# Patient Record
Sex: Female | Born: 1960
Health system: Southern US, Community
[De-identification: ages and names within clinical notes are randomized; demographics above are authoritative.]

## PROBLEM LIST (undated history)

## (undated) DIAGNOSIS — M1990 Unspecified osteoarthritis, unspecified site: Secondary | ICD-10-CM

## (undated) DIAGNOSIS — E559 Vitamin D deficiency, unspecified: Secondary | ICD-10-CM

## (undated) DIAGNOSIS — E079 Disorder of thyroid, unspecified: Secondary | ICD-10-CM

## (undated) DIAGNOSIS — E785 Hyperlipidemia, unspecified: Secondary | ICD-10-CM

## (undated) DIAGNOSIS — N200 Calculus of kidney: Secondary | ICD-10-CM

## (undated) DIAGNOSIS — E039 Hypothyroidism, unspecified: Secondary | ICD-10-CM

## (undated) DIAGNOSIS — F419 Anxiety disorder, unspecified: Secondary | ICD-10-CM

## (undated) DIAGNOSIS — H269 Unspecified cataract: Secondary | ICD-10-CM

## (undated) DIAGNOSIS — R7303 Prediabetes: Secondary | ICD-10-CM

## (undated) DIAGNOSIS — R9431 Abnormal electrocardiogram [ECG] [EKG]: Secondary | ICD-10-CM

## (undated) DIAGNOSIS — K760 Fatty (change of) liver, not elsewhere classified: Secondary | ICD-10-CM

## (undated) DIAGNOSIS — G4700 Insomnia, unspecified: Secondary | ICD-10-CM

## (undated) DIAGNOSIS — K219 Gastro-esophageal reflux disease without esophagitis: Secondary | ICD-10-CM

## (undated) DIAGNOSIS — T7840XA Allergy, unspecified, initial encounter: Secondary | ICD-10-CM

## (undated) DIAGNOSIS — I10 Essential (primary) hypertension: Secondary | ICD-10-CM

## (undated) HISTORY — DX: Gastro-esophageal reflux disease without esophagitis: K21.9

## (undated) HISTORY — DX: Hyperlipidemia, unspecified: E78.5

## (undated) HISTORY — DX: Prediabetes: R73.03

## (undated) HISTORY — DX: Abnormal electrocardiogram (ECG) (EKG): R94.31

## (undated) HISTORY — DX: Insomnia, unspecified: G47.00

## (undated) HISTORY — DX: Disorder of thyroid, unspecified: E07.9

## (undated) HISTORY — DX: Fatty (change of) liver, not elsewhere classified: K76.0

## (undated) HISTORY — DX: Unspecified cataract: H26.9

## (undated) HISTORY — DX: Vitamin D deficiency, unspecified: E55.9

## (undated) HISTORY — DX: Anxiety disorder, unspecified: F41.9

## (undated) HISTORY — DX: Allergy, unspecified, initial encounter: T78.40XA

## (undated) HISTORY — PX: TUBAL LIGATION: SHX77

## (undated) HISTORY — PX: CHOLECYSTECTOMY: SHX55

## (undated) HISTORY — DX: Calculus of kidney: N20.0

## (undated) HISTORY — DX: Essential (primary) hypertension: I10

## (undated) HISTORY — PX: COLONOSCOPY: SHX174

## (undated) HISTORY — PX: KNEE ARTHROSCOPY: SHX127

## (undated) HISTORY — PX: POLYPECTOMY: SHX149

## (undated) HISTORY — PX: BREAST BIOPSY: SHX20

---

## 1999-06-13 ENCOUNTER — Encounter: Payer: Self-pay | Admitting: Internal Medicine

## 1999-06-13 ENCOUNTER — Ambulatory Visit (HOSPITAL_COMMUNITY): Admission: RE | Admit: 1999-06-13 | Discharge: 1999-06-13 | Payer: Self-pay | Admitting: Internal Medicine

## 2000-06-07 ENCOUNTER — Other Ambulatory Visit: Admission: RE | Admit: 2000-06-07 | Discharge: 2000-06-07 | Payer: Self-pay | Admitting: Internal Medicine

## 2000-06-13 ENCOUNTER — Ambulatory Visit (HOSPITAL_COMMUNITY): Admission: RE | Admit: 2000-06-13 | Discharge: 2000-06-13 | Payer: Self-pay | Admitting: Internal Medicine

## 2000-06-13 ENCOUNTER — Encounter: Payer: Self-pay | Admitting: Internal Medicine

## 2001-06-17 ENCOUNTER — Ambulatory Visit (HOSPITAL_COMMUNITY): Admission: RE | Admit: 2001-06-17 | Discharge: 2001-06-17 | Payer: Self-pay | Admitting: Internal Medicine

## 2001-06-17 ENCOUNTER — Encounter: Payer: Self-pay | Admitting: Internal Medicine

## 2002-07-09 ENCOUNTER — Ambulatory Visit (HOSPITAL_COMMUNITY): Admission: RE | Admit: 2002-07-09 | Discharge: 2002-07-09 | Payer: Self-pay | Admitting: Internal Medicine

## 2002-07-09 ENCOUNTER — Encounter: Payer: Self-pay | Admitting: Internal Medicine

## 2003-10-06 ENCOUNTER — Ambulatory Visit (HOSPITAL_COMMUNITY): Admission: RE | Admit: 2003-10-06 | Discharge: 2003-10-06 | Payer: Self-pay | Admitting: Internal Medicine

## 2004-10-17 ENCOUNTER — Other Ambulatory Visit: Admission: RE | Admit: 2004-10-17 | Discharge: 2004-10-17 | Payer: Self-pay | Admitting: Internal Medicine

## 2004-11-09 ENCOUNTER — Ambulatory Visit (HOSPITAL_COMMUNITY): Admission: RE | Admit: 2004-11-09 | Discharge: 2004-11-09 | Payer: Self-pay | Admitting: Internal Medicine

## 2005-11-09 ENCOUNTER — Ambulatory Visit (HOSPITAL_COMMUNITY): Admission: RE | Admit: 2005-11-09 | Discharge: 2005-11-09 | Payer: Self-pay | Admitting: Internal Medicine

## 2006-12-03 ENCOUNTER — Ambulatory Visit (HOSPITAL_COMMUNITY): Admission: RE | Admit: 2006-12-03 | Discharge: 2006-12-03 | Payer: Self-pay | Admitting: Internal Medicine

## 2007-12-02 ENCOUNTER — Encounter: Payer: Self-pay | Admitting: Gastroenterology

## 2007-12-02 ENCOUNTER — Other Ambulatory Visit: Admission: RE | Admit: 2007-12-02 | Discharge: 2007-12-02 | Payer: Self-pay | Admitting: Internal Medicine

## 2007-12-05 ENCOUNTER — Ambulatory Visit (HOSPITAL_COMMUNITY): Admission: RE | Admit: 2007-12-05 | Discharge: 2007-12-05 | Payer: Self-pay | Admitting: Internal Medicine

## 2010-04-09 ENCOUNTER — Encounter: Payer: Self-pay | Admitting: Gastroenterology

## 2010-04-10 ENCOUNTER — Other Ambulatory Visit: Admission: RE | Admit: 2010-04-10 | Discharge: 2010-04-10 | Payer: Self-pay | Admitting: Internal Medicine

## 2010-04-10 ENCOUNTER — Encounter: Payer: Self-pay | Admitting: Gastroenterology

## 2010-04-17 ENCOUNTER — Encounter: Payer: Self-pay | Admitting: Gastroenterology

## 2010-04-26 ENCOUNTER — Ambulatory Visit (HOSPITAL_COMMUNITY): Admission: RE | Admit: 2010-04-26 | Discharge: 2010-04-26 | Payer: Self-pay | Admitting: Internal Medicine

## 2010-05-02 ENCOUNTER — Encounter (INDEPENDENT_AMBULATORY_CARE_PROVIDER_SITE_OTHER): Payer: Self-pay | Admitting: *Deleted

## 2010-05-04 ENCOUNTER — Ambulatory Visit: Payer: Self-pay | Admitting: Gastroenterology

## 2010-05-16 ENCOUNTER — Telehealth: Payer: Self-pay | Admitting: Gastroenterology

## 2010-05-18 ENCOUNTER — Ambulatory Visit: Payer: Self-pay | Admitting: Gastroenterology

## 2010-05-25 ENCOUNTER — Encounter: Payer: Self-pay | Admitting: Gastroenterology

## 2010-12-19 NOTE — Miscellaneous (Signed)
Summary: [facility] Previsit/prep  Clinical Lists Changes  Medications: Added new medication of MOVIPREP 100 GM  SOLR (PEG-KCL-NACL-NASULF-NA ASC-C) As per prep instructions. - Signed Rx of MOVIPREP 100 GM  SOLR (PEG-KCL-NACL-NASULF-NA ASC-C) As per prep instructions.;  #1 x 0;  Signed;  Entered by: Wyona Almas RN;  Authorized by: Louis Meckel MD;  Method used: Electronically to [facility] [address] [place], [place], [place]  [address], Ph: [phone], Fax: [phone] Allergies: Added new allergy or adverse reaction of CODEINE Observations: Added new observation of NKA: F (05/04/2010 8:16)    Prescriptions: MOVIPREP 100 GM  SOLR (PEG-KCL-NACL-NASULF-NA ASC-C) As per prep instructions.  #1 x 0   Entered by:   Wyona Almas RN   Authorized by:   Louis Meckel MD   Signed by:   Wyona Almas RN on 05/04/2010   Method used:   Electronically to        [facility] (retail)       [address]       [place], [place]  [address]       Ph: [phone]       Fax: [phone]   RxID:   [id number]

## 2010-12-19 NOTE — Procedures (Signed)
Summary: Colonoscopy  Patient: Shannon Pope Note: All result statuses are Final unless otherwise noted.  Tests: (1) Colonoscopy (COL)   COL Colonoscopy           DONE (C)     [facility]     [address]     [place], [place]  [address]           COLONOSCOPY PROCEDURE REPORT           PATIENT:  Shannon, Pope  MR#:  [id number]     BIRTHDATE:  04-Nov-1961, 48 yrs. old  GENDER:  female           ENDOSCOPIST:  Barbette Hair. Arlyce Dice, MD     Referred by:           PROCEDURE DATE:  05/18/2010     PROCEDURE:  Colonoscopy with polypectomy and submucosal injection     ASA CLASS:  Class I     INDICATIONS:  1) Routine Risk Screening           MEDICATIONS:   Fentanyl 75 mcg IV, Versed 9 mg IV, Benadryl 12.5mg      IV (correction)           DESCRIPTION OF PROCEDURE:   After the risks benefits and     alternatives of the procedure were thoroughly explained, informed     consent was obtained.  Digital rectal exam was performed and     revealed no abnormalities.   The LB [id number] endoscope     was introduced through the anus and advanced to the cecum, which     was identified by both the appendix and ileocecal valve, without     limitations.  The quality of the prep was excellent, using     MoviPrep.  The instrument was then slowly withdrawn as the colon     was fully examined.     <<PROCEDUREIMAGES>>           FINDINGS:  A sessile polyp was found in the ascending colon. It     was 13 mm in size. submucosal injection Polyps were snared, then     cauterized with monopolar cautery. Retrieval was successful (see     image3 and image4). snare polyp 4cc NS injected submucosally  This     was otherwise a normal examination of the colon (see image2,     image5, image6, image8, image9, image10, and image11).     Retroflexed views in the rectum revealed no abnormalities.    The     time to cecum =  2.75  minutes. The scope was then withdrawn (time     =  9.75  min) from the patient  and the procedure completed.           COMPLICATIONS:  None           ENDOSCOPIC IMPRESSION:     1) 13 mm sessile polyp in the ascending colon     2) Otherwise normal examination     RECOMMENDATIONS:     1) If the polyp(s) removed today are proven to be adenomatous     (pre-cancerous) polyps, you will need a repeat colonoscopy in 5     years. Otherwise you should continue to follow colorectal cancer     screening guidelines for "routine risk" patients with colonoscopy     in 10 years.           REPEAT EXAM:   You will receive a  letter from Dr. Arlyce Dice in 1-2     weeks, after reviewing the final pathology, with followup     recommendations.           ______________________________     Barbette Hair Arlyce Dice, MD           CC: Lucky Cowboy, MD           n.     REVISED:  05/18/2010 08:52 AM     eSIGNED:   Barbette Hair. Kaplan at 05/18/2010 08:52 AM           Dorita Fray, [id number]  Note: An exclamation mark (!) indicates a result that was not dispersed into the flowsheet. Document Creation Date: 05/18/2010 8:53 AM _______________________________________________________________________  (1) Order result status: Final Collection or observation date-time: 05/18/2010 08:40 Requested date-time:  Receipt date-time:  Reported date-time:  Referring Physician:   Ordering Physician: Melvia Heaps [phone]) Specimen Source:  Source: Launa Grill Order Number: [id number] Lab site:   Appended Document: Colonoscopy 3 yr recall     Procedures Next Due Date:    Colonoscopy: 04/2013

## 2010-12-19 NOTE — Letter (Signed)
Summary: [facility]  [facility]   Imported By: Sherian Rein 04/20/2010 08:56:48  _____________________________________________________________________  External Attachment:    Type:   Image     Comment:   External Document

## 2010-12-19 NOTE — Letter (Signed)
Summary: [facility]  [facility]   Imported By: Sherian Rein 04/20/2010 08:58:43  _____________________________________________________________________  External Attachment:    Type:   Image     Comment:   External Document

## 2010-12-19 NOTE — Letter (Signed)
Summary: [facility] Instructions  [facility]  [address] [place], [place] [address]   Phone: [phone]  Fax: [phone]       Shannon Pope    10-Oct-1973    MRN: [id number]        Procedure Day Dorna Bloom:  Lenor Coffin  05/18/10     Arrival Time:  7:30am     Procedure Time:  8:30am     Location of Procedure:                    _X _  [facility] (4th Floor)                       PREPARATION FOR COLONOSCOPY WITH MOVIPREP   Starting 5 days prior to your procedure  SATURDAY 05/13/10  do not eat nuts, seeds, popcorn, corn, beans, peas,  salads, or any raw vegetables.  Do not take any fiber supplements (e.g. Metamucil, Citrucel, and Benefiber).  THE DAY BEFORE YOUR PROCEDURE         DATE: [facility]  05/17/10  1.  Drink clear liquids the entire day-NO SOLID FOOD  2.  Do not drink anything colored red or purple.  Avoid juices with pulp.  No orange juice.  3.  Drink at least 64 oz. (8 glasses) of fluid/clear liquids during the day to prevent dehydration and help the prep work efficiently.  CLEAR LIQUIDS INCLUDE: Water Jello Ice Popsicles Tea (sugar ok, no milk/cream) Powdered fruit flavored drinks Coffee (sugar ok, no milk/cream) Gatorade Juice: apple, white grape, white cranberry  Lemonade Clear bullion, consomm, broth Carbonated beverages (any kind) Strained chicken noodle soup Hard Candy                             4.  In the morning, mix first dose of MoviPrep solution:    Empty 1 Pouch A and 1 Pouch B into the disposable container    Add lukewarm drinking water to the top line of the container. Mix to dissolve    Refrigerate (mixed solution should be used within 24 hrs)  5.  Begin drinking the prep at 5:00 p.m. The MoviPrep container is divided by 4 marks.   Every 15 minutes drink the solution down to the next mark (approximately 8 oz) until the full liter is complete.   6.  Follow completed prep with 16 oz of clear liquid of your  choice (Nothing red or purple).  Continue to drink clear liquids until bedtime.  7.  Before going to bed, mix second dose of MoviPrep solution:    Empty 1 Pouch A and 1 Pouch B into the disposable container    Add lukewarm drinking water to the top line of the container. Mix to dissolve    Refrigerate  THE DAY OF YOUR PROCEDURE      DATE:  THURSDAY  05/18/10  Beginning at  3:30 a.m. (5 hours before procedure):         1. Every 15 minutes, drink the solution down to the next mark (approx 8 oz) until the full liter is complete.  2. Follow completed prep with 16 oz. of clear liquid of your choice.    3. You may drink clear liquids until  6:30am  (2 HOURS BEFORE PROCEDURE).   MEDICATION INSTRUCTIONS  Unless otherwise instructed, you should take regular prescription medications with a small sip of water   as  early as possible the morning of your procedure.        OTHER INSTRUCTIONS  You will need a responsible adult at least 52 years of age to accompany you and drive you home.   This person must remain in the waiting room during your procedure.  Wear loose fitting clothing that is easily removed.  Leave jewelry and other valuables at home.  However, you may wish to bring a book to read or  an iPod/MP3 player to listen to music as you wait for your procedure to start.  Remove all body piercing jewelry and leave at home.  Total time from sign-in until discharge is approximately 2-3 hours.  You should go home directly after your procedure and rest.  You can resume normal activities the  day after your procedure.  The day of your procedure you should not:   Drive   Make legal decisions   Operate machinery   Drink alcohol   Return to work  You will receive specific instructions about eating, activities and medications before you leave.    The above instructions have been reviewed and explained to me by  Wyona Almas RN  May 04, 2010 8:48 AM     I fully  understand and can verbalize these instructions _____________________________ Date _________

## 2010-12-19 NOTE — Progress Notes (Signed)
  Pt. called and asked to have her Movi prep resent to [facility].  I spoke with [facility] pharmacy and they had Rx and would fill it.  Left messaage on pt.'s home phone.

## 2010-12-19 NOTE — Letter (Signed)
Summary: Patient Notice- Polyp Results  [facility]  [address] [place], [place] [address]   Phone: [phone]  Fax: [phone]        May 25, 2010 MRN: [id number]    LONEY PETO [address] [place], [place]  [address]    Dear Ms. Otilio Carpen,  I am pleased to inform you that the colon polyp(s) removed during your recent colonoscopy was (were) found to be benign (no cancer detected) upon pathologic examination.  I recommend you have a repeat colonoscopy examination in _3 years to look for recurrent polyps, as having colon polyps increases your risk for having recurrent polyps or even colon cancer in the future.  Should you develop new or worsening symptoms of abdominal pain, bowel habit changes or bleeding from the rectum or bowels, please schedule an evaluation with either your primary care physician or with me.  Additional information/recommendations:  __ No further action with gastroenterology is needed at this time. Please      follow-up with your primary care physician for your other healthcare      needs.  __ Please call [phone] to schedule a return visit to review your      situation.  __ Please keep your follow-up visit as already scheduled.  _x_ Continue treatment plan as outlined the day of your exam.  Please call us if you are having persistent problems or have questions about your condition that have not been fully answered at this time.  Sincerely,  Louis Meckel MD  This letter has been electronically signed by your physician.  Appended Document: Patient Notice- Polyp Results letter mailed

## 2010-12-22 NOTE — Letter (Signed)
Summary: [facility]  [facility]   Imported By: Sherian Rein 04/20/2010 08:57:37  _____________________________________________________________________  External Attachment:    Type:   Image     Comment:   External Document

## 2012-12-25 ENCOUNTER — Other Ambulatory Visit: Payer: Self-pay | Admitting: Internal Medicine

## 2012-12-25 DIAGNOSIS — N6310 Unspecified lump in the right breast, unspecified quadrant: Secondary | ICD-10-CM

## 2013-01-06 ENCOUNTER — Ambulatory Visit
Admission: RE | Admit: 2013-01-06 | Discharge: 2013-01-06 | Disposition: A | Discharge status: Home / Self Care | Payer: 59 | Source: Ambulatory Visit | Attending: Internal Medicine | Admitting: Internal Medicine

## 2013-01-06 ENCOUNTER — Other Ambulatory Visit: Payer: Self-pay | Admitting: Internal Medicine

## 2013-01-06 DIAGNOSIS — R921 Mammographic calcification found on diagnostic imaging of breast: Secondary | ICD-10-CM

## 2013-01-06 DIAGNOSIS — N6310 Unspecified lump in the right breast, unspecified quadrant: Secondary | ICD-10-CM

## 2013-03-31 ENCOUNTER — Ambulatory Visit (INDEPENDENT_AMBULATORY_CARE_PROVIDER_SITE_OTHER): Payer: 59 | Admitting: Family Medicine

## 2013-03-31 ENCOUNTER — Ambulatory Visit: Payer: 59

## 2013-03-31 ENCOUNTER — Other Ambulatory Visit: Payer: Self-pay | Admitting: Family Medicine

## 2013-03-31 VITALS — BP 112/86 | HR 82 | Temp 98.2°F | Resp 16 | Ht 64.5 in | Wt 212.0 lb

## 2013-03-31 DIAGNOSIS — S79911A Unspecified injury of right hip, initial encounter: Secondary | ICD-10-CM

## 2013-03-31 DIAGNOSIS — S79919A Unspecified injury of unspecified hip, initial encounter: Secondary | ICD-10-CM

## 2013-03-31 DIAGNOSIS — S99919A Unspecified injury of unspecified ankle, initial encounter: Secondary | ICD-10-CM

## 2013-03-31 DIAGNOSIS — S59901A Unspecified injury of right elbow, initial encounter: Secondary | ICD-10-CM

## 2013-03-31 DIAGNOSIS — S79912A Unspecified injury of left hip, initial encounter: Secondary | ICD-10-CM

## 2013-03-31 DIAGNOSIS — S8990XA Unspecified injury of unspecified lower leg, initial encounter: Secondary | ICD-10-CM

## 2013-03-31 DIAGNOSIS — S99911A Unspecified injury of right ankle, initial encounter: Secondary | ICD-10-CM

## 2013-03-31 DIAGNOSIS — S6990XA Unspecified injury of unspecified wrist, hand and finger(s), initial encounter: Secondary | ICD-10-CM

## 2013-03-31 DIAGNOSIS — S7000XA Contusion of unspecified hip, initial encounter: Secondary | ICD-10-CM

## 2013-03-31 DIAGNOSIS — S59909A Unspecified injury of unspecified elbow, initial encounter: Secondary | ICD-10-CM

## 2013-03-31 DIAGNOSIS — S59919A Unspecified injury of unspecified forearm, initial encounter: Secondary | ICD-10-CM

## 2013-03-31 MED ORDER — CYCLOBENZAPRINE HCL 5 MG PO TABS: 5.0000 mg | ORAL_TABLET | Freq: Every evening | ORAL | Status: DC | PRN
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
## 2013-04-22 ENCOUNTER — Encounter (HOSPITAL_COMMUNITY): Payer: Self-pay | Admitting: *Deleted

## 2013-04-22 ENCOUNTER — Emergency Department (HOSPITAL_COMMUNITY)
Admission: EM | Admit: 2013-04-22 | Discharge: 2013-04-22 | Disposition: A | Discharge status: Home / Self Care | Payer: 59 | Source: Home / Self Care | Attending: Family Medicine | Admitting: Family Medicine

## 2013-04-22 ENCOUNTER — Emergency Department (INDEPENDENT_AMBULATORY_CARE_PROVIDER_SITE_OTHER): Payer: 59

## 2013-04-22 DIAGNOSIS — S2020XA Contusion of thorax, unspecified, initial encounter: Secondary | ICD-10-CM

## 2013-04-22 DIAGNOSIS — S300XXA Contusion of lower back and pelvis, initial encounter: Secondary | ICD-10-CM

## 2013-04-22 MED ORDER — IBUPROFEN 600 MG PO TABS: 600.0000 mg | ORAL_TABLET | Freq: Three times a day (TID) | ORAL | Status: DC | PRN

## 2013-04-22 MED ORDER — HYDROCODONE-ACETAMINOPHEN 5-325 MG PO TABS: 2.0000 | ORAL_TABLET | Freq: Three times a day (TID) | ORAL | Status: DC | PRN

## 2013-04-22 MED ORDER — CYCLOBENZAPRINE HCL 10 MG PO TABS: 10.0000 mg | ORAL_TABLET | Freq: Two times a day (BID) | ORAL | Status: DC | PRN

## 2013-04-22 NOTE — ED Provider Notes (Signed)
History     CSN: [id number]  Arrival date & time 04/22/13  1018   First MD Initiated Contact with Patient 04/22/13 1050      Chief Complaint  Patient presents with  . Fall    (Consider location/radiation/quality/duration/timing/severity/associated sxs/prior treatment) HPI Comments: 52 year old female with history of hypothyroidism and hypertension. Here complaining of pain in right gluteal area after an injury 3 days ago. Patient states she was stepping down out of her porch  and slipped and fell sitting over a stone over  her right gluteal area. Has bruising and pain since this incident. She was able to get out by self and is able to walk and bear weight on both  legs and hips with discomfort on right side. Reports a painful pulling sensation worse with sitting. Denies low extremity weakness numbness or paresthesias. Denies low back pain. Denies urinary retention or hematuria.    Past Medical History  Diagnosis Date  . Allergy   . Thyroid disease     Past Surgical History  Procedure Laterality Date  . Cholecystectomy      Family History  Problem Relation Age of Onset  . Heart Problems Mother   . Hypertension Mother     History  Substance Use Topics  . Smoking status: Former Games developer  . Smokeless tobacco: Not on file     Comment: quit 15years ago  . Alcohol Use: Yes     Comment: occasionally    OB History   Grav Para Term Preterm Abortions TAB SAB Ect Mult Living                  Review of Systems  HENT: Negative for neck pain.        Denies head trauma  Musculoskeletal: Negative for back pain.       Pain in "right buttock" as per HPI.  Skin:       Bruising of "right buttock" as per HPI  Neurological: Negative for dizziness and headaches.  All other systems reviewed and are negative.    Allergies  Codeine and Penicillins  Home Medications   Current Outpatient Rx  Name  Route  Sig  Dispense  Refill  . cyclobenzaprine (FLEXERIL) 10 MG tablet   Oral  Take 1 tablet (10 mg total) by mouth 2 (two) times daily as needed for muscle spasms.   20 tablet   0   . hydrochlorothiazide (HYDRODIURIL) 25 MG tablet   Oral   Take 25 mg by mouth daily.         Marland Kitchen HYDROcodone-acetaminophen (NORCO/VICODIN) 5-325 MG per tablet   Oral   Take 2 tablets by mouth every 8 (eight) hours as needed for pain.   15 tablet   0   . ibuprofen (ADVIL,MOTRIN) 600 MG tablet   Oral   Take 1 tablet (600 mg total) by mouth every 8 (eight) hours as needed for pain.   21 tablet   0   . levothyroxine (SYNTHROID, LEVOTHROID) 100 MCG tablet   Oral   Take 100 mcg by mouth daily.         . quinapril (ACCUPRIL) 40 MG tablet   Oral   Take 40 mg by mouth daily.           BP 157/104  Pulse 92  Temp(Src) 98.6 F (37 C) (Oral)  Resp 18  SpO2 99%  LMP 04/01/2013  Physical Exam  Nursing note and vitals reviewed. Constitutional: She is oriented to person, place, and time.  She appears well-developed and well-nourished. No distress.  HENT:  Head: Normocephalic and atraumatic.  Cardiovascular: Normal rate, regular rhythm and normal heart sounds.   Pulmonary/Chest: Breath sounds normal.  Abdominal: Soft. There is no tenderness.  Musculoskeletal:  Central spine with no scoliosis or kyphosis. Fair anterior flexion and posterior extension. Patient able to walk on tip toes and heels with no difficulty foot drop or pain exacerbation. No bone prominence tenderness. Specifically no tenderness or hematoma over coxal bone. Minimal tenderness to palpation in bilateral lumbar paravertebral muscles.  Negative straight leg test bilateral. Intact sensation and symmetric + DTRs (rotullian and achillean) in low extremities.  Right hip: fair internal and external rotation as well as flexion and extension. Weight bearing on the right with no hip pain. No pain with palpation over hip joint.  Tenderness and ecchymosis over right ischial area.    Lymphadenopathy:    She has no  cervical adenopathy.  Neurological: She is alert and oriented to person, place, and time.  Skin: She is not diaphoretic.  Large ecchymosis in right gluteus at ischial area. No induration or fluctuant hematoma. Area is tender to toach. No skin brakes, abrassions or lacerations.    ED Course  Procedures (including critical care time)  Labs Reviewed - No data to display Dg Pelvis 1-2 Views  04/22/2013   *RADIOLOGY REPORT*  Clinical Data: Status post fall.  Right buttock pain.  PELVIS - 1-2 VIEW  Comparison: Plain films of the pelvis and hips 03/31/2013.  Findings: Both hips are located.  No fracture is identified.  Soft tissue structures are unremarkable.  IMPRESSION: No acute finding.   Original Report Authenticated By: Holley Dexter, M.D.     1. Contusion of pelvis, initial encounter       MDM  Treated with Flexeril, hydrocodone/acetaminophen 5/325 mg #15 pills and ibuprofen. Orthopedic referral as needed. Patient was also found to be incidentally hypertensive with BP 157/109 asked to take her medications daily and have her blood pressure rechecked at her PCP office once pain improves in the next 1-14 days. Supportive care and red flags that should prompt her return to medical attention discussed with patient and provided in writing.        Sharin Grave, MD 04/23/13 1029

## 2013-04-22 NOTE — ED Notes (Signed)
Pt  Reports     She  Felled  Down  Some  Steps  3  Days   Ago  And  Landed on  Her  Tailbone   -   She       Reports        Pain  On  Weight  Bearing    -         As  Well  As pain in  General          Pt  Reports  Symptoms  Not  releived  By  otc  meds

## 2013-08-19 DIAGNOSIS — K760 Fatty (change of) liver, not elsewhere classified: Secondary | ICD-10-CM

## 2013-08-19 HISTORY — DX: Fatty (change of) liver, not elsewhere classified: K76.0

## 2013-08-25 ENCOUNTER — Other Ambulatory Visit: Payer: Self-pay | Admitting: Internal Medicine

## 2013-08-25 DIAGNOSIS — R7989 Other specified abnormal findings of blood chemistry: Secondary | ICD-10-CM

## 2013-09-01 ENCOUNTER — Ambulatory Visit
Admission: RE | Admit: 2013-09-01 | Discharge: 2013-09-01 | Disposition: A | Discharge status: Home / Self Care | Payer: 59 | Source: Ambulatory Visit | Attending: Internal Medicine | Admitting: Internal Medicine

## 2013-09-01 ENCOUNTER — Other Ambulatory Visit: Payer: Self-pay | Admitting: Internal Medicine

## 2013-09-01 DIAGNOSIS — R7989 Other specified abnormal findings of blood chemistry: Secondary | ICD-10-CM

## 2013-09-01 DIAGNOSIS — R109 Unspecified abdominal pain: Secondary | ICD-10-CM

## 2013-09-08 ENCOUNTER — Encounter (HOSPITAL_COMMUNITY): Payer: Self-pay | Admitting: Emergency Medicine

## 2013-09-08 ENCOUNTER — Emergency Department (HOSPITAL_COMMUNITY)
Admission: EM | Admit: 2013-09-08 | Discharge: 2013-09-08 | Disposition: A | Discharge status: Home / Self Care | Payer: 59 | Attending: Emergency Medicine | Admitting: Emergency Medicine

## 2013-09-08 DIAGNOSIS — Z79899 Other long term (current) drug therapy: Secondary | ICD-10-CM | POA: Insufficient documentation

## 2013-09-08 DIAGNOSIS — E079 Disorder of thyroid, unspecified: Secondary | ICD-10-CM | POA: Insufficient documentation

## 2013-09-08 DIAGNOSIS — Z880 Allergy status to penicillin: Secondary | ICD-10-CM | POA: Insufficient documentation

## 2013-09-08 DIAGNOSIS — N200 Calculus of kidney: Secondary | ICD-10-CM | POA: Insufficient documentation

## 2013-09-08 DIAGNOSIS — Z87891 Personal history of nicotine dependence: Secondary | ICD-10-CM | POA: Insufficient documentation

## 2013-09-08 LAB — URINE MICROSCOPIC-ADD ON

## 2013-09-08 LAB — URINALYSIS, ROUTINE W REFLEX MICROSCOPIC
Glucose, UA: NEGATIVE mg/dL
Ketones, ur: NEGATIVE mg/dL
Protein, ur: NEGATIVE mg/dL
Urobilinogen, UA: 1 mg/dL (ref 0.0–1.0)

## 2013-09-08 MED ORDER — KETOROLAC TROMETHAMINE 30 MG/ML IJ SOLN
30.0000 mg | Freq: Once | INTRAMUSCULAR | Status: AC
  Administered 2013-09-08: 30 mg via INTRAVENOUS
  Filled 2013-09-08: qty 1

## 2013-09-08 MED ORDER — OXYCODONE-ACETAMINOPHEN 5-325 MG PO TABS
1.0000 | ORAL_TABLET | Freq: Once | ORAL | Status: AC
  Administered 2013-09-08: 1 via ORAL
  Filled 2013-09-08: qty 1

## 2013-09-08 MED ORDER — CEFPODOXIME PROXETIL 100 MG PO TABS: 100.0000 mg | ORAL_TABLET | Freq: Two times a day (BID) | ORAL | Status: DC

## 2013-09-08 MED ORDER — CEFPODOXIME PROXETIL 200 MG PO TABS
200.0000 mg | ORAL_TABLET | Freq: Once | ORAL | Status: AC
  Administered 2013-09-08: 200 mg via ORAL
  Filled 2013-09-08: qty 1

## 2013-09-08 MED ORDER — OXYCODONE-ACETAMINOPHEN 5-325 MG PO TABS: 2.0000 | ORAL_TABLET | Freq: Four times a day (QID) | ORAL | Status: DC | PRN

## 2013-09-08 MED ORDER — SODIUM CHLORIDE 0.9 % IV BOLUS (SEPSIS)
1000.0000 mL | Freq: Once | INTRAVENOUS | Status: AC
  Administered 2013-09-08: 1000 mL via INTRAVENOUS

## 2013-09-08 NOTE — ED Notes (Signed)
Hold for 15 minutes per [place] PA due to medication given

## 2013-09-08 NOTE — ED Provider Notes (Signed)
CSN: [id number]     Arrival date & time 09/08/13  1957 History   First MD Initiated Contact with Patient 09/08/13 2012     Chief Complaint  Patient presents with  . Nephrolithiasis   (Consider location/radiation/quality/duration/timing/severity/associated sxs/prior Treatment) HPI Comments: Patient presents to the ED with a chief complaint of flank pain.  Patient states that she was seen by Dr. McDiarmid of urology last week and was diagnosed with a 4mm kidney stone on the left side.  She states that her pain has been well-controlled until yesterday.  Patient states that she was told to come to the ED if the pain worsened outside of office hours.  Patient also states that she has had difficulty urinating.  She denies fevers or chills.  Denies nausea with use of medication.  The history is provided by the patient. No language interpreter was used.    Past Medical History  Diagnosis Date  . Allergy   . Thyroid disease    Past Surgical History  Procedure Laterality Date  . Cholecystectomy     Family History  Problem Relation Age of Onset  . Heart Problems Mother   . Hypertension Mother    History  Substance Use Topics  . Smoking status: Former Games developer  . Smokeless tobacco: Not on file     Comment: quit 15years ago  . Alcohol Use: Yes     Comment: occasionally   OB History   Grav Para Term Preterm Abortions TAB SAB Ect Mult Living                 Review of Systems  All other systems reviewed and are negative.    Allergies  Codeine and Penicillins  Home Medications   Current Outpatient Rx  Name  Route  Sig  Dispense  Refill  . cholecalciferol (VITAMIN D) 1000 UNITS tablet   Oral   Take 1,000 Units by mouth daily.         . hydrochlorothiazide (HYDRODIURIL) 25 MG tablet   Oral   Take 25 mg by mouth daily.         Marland Kitchen ibuprofen (ADVIL,MOTRIN) 600 MG tablet   Oral   Take 1 tablet (600 mg total) by mouth every 8 (eight) hours as needed for pain.   21 tablet    0   . levothyroxine (SYNTHROID, LEVOTHROID) 100 MCG tablet   Oral   Take 100 mcg by mouth daily.         . phenazopyridine (PYRIDIUM) 200 MG tablet   Oral   Take 200 mg by mouth 3 (three) times daily as needed for pain.         . promethazine (PHENERGAN) 25 MG tablet   Oral   Take 25 mg by mouth every 8 (eight) hours as needed for nausea (nausesa).         . quinapril (ACCUPRIL) 40 MG tablet   Oral   Take 40 mg by mouth daily.         . tamsulosin (FLOMAX) 0.4 MG CAPS capsule   Oral   Take by mouth daily.         . traMADol (ULTRAM) 50 MG tablet   Oral   Take 50 mg by mouth every 4 (four) hours as needed for pain (pain).          BP 137/98  Pulse 90  Temp(Src) 98.2 F (36.8 C) (Oral)  Resp 18  SpO2 96%  LMP 09/01/2013 Physical Exam  Nursing note  and vitals reviewed. Constitutional: She is oriented to person, place, and time. She appears well-developed and well-nourished.  HENT:  Head: Normocephalic and atraumatic.  Eyes: Conjunctivae and EOM are normal. Pupils are equal, round, and reactive to light.  Neck: Normal range of motion. Neck supple.  Cardiovascular: Normal rate and regular rhythm.  Exam reveals no gallop and no friction rub.   No murmur heard. Pulmonary/Chest: Effort normal and breath sounds normal. No respiratory distress. She has no wheezes. She has no rales. She exhibits no tenderness.  Abdominal: Soft. Bowel sounds are normal. She exhibits no distension and no mass. There is tenderness. There is no rebound and no guarding.  Left sided CVA tenderness, no focal abdominal tenderness, no signs of surgical or acute abdomen  Musculoskeletal: Normal range of motion. She exhibits no edema and no tenderness.  Neurological: She is alert and oriented to person, place, and time.  Skin: Skin is warm and dry.  Psychiatric: She has a normal mood and affect. Her behavior is normal. Judgment and thought content normal.    ED Course  Procedures (including  critical care time) Results for orders placed during the hospital encounter of 09/08/13  URINALYSIS, ROUTINE W REFLEX MICROSCOPIC      Result Value Range   Color, Urine ORANGE (*) YELLOW   APPearance CLOUDY (*) CLEAR   Specific Gravity, Urine 1.032 (*) 1.005 - 1.030   pH 5.0  5.0 - 8.0   Glucose, UA NEGATIVE  NEGATIVE mg/dL   Hgb urine dipstick MODERATE (*) NEGATIVE   Bilirubin Urine SMALL (*) NEGATIVE   Ketones, ur NEGATIVE  NEGATIVE mg/dL   Protein, ur NEGATIVE  NEGATIVE mg/dL   Urobilinogen, UA 1.0  0.0 - 1.0 mg/dL   Nitrite POSITIVE (*) NEGATIVE   Leukocytes, UA SMALL (*) NEGATIVE  URINE MICROSCOPIC-ADD ON      Result Value Range   Squamous Epithelial / LPF RARE  RARE   WBC, UA 0-2  <3 WBC/hpf   RBC / HPF 11-20  <3 RBC/hpf   Bacteria, UA RARE  RARE   Crystals CA OXALATE CRYSTALS (*) NEGATIVE   Ct Abdomen Pelvis Wo Contrast  09/01/2013   CLINICAL DATA:  Left flank pain with nausea and vomiting.  EXAM: CT ABDOMEN AND PELVIS WITHOUT CONTRAST  TECHNIQUE: Multidetector CT imaging of the abdomen and pelvis was performed following the standard protocol without intravenous contrast.  COMPARISON:  None.  FINDINGS: The lung bases are clear. No evidence for free air.  There is a 4 mm stone in the distal left ureter near the ureterovesical junction. Dilatation of the left ureter and left renal collecting system. There is edema around the left kidney and the left ureter. There are no other definite kidney stones. Normal appearance of the right kidney without hydronephrosis or stones.  The gallbladder has been removed and there is no gross abnormality of the liver, spleen, pancreas or adrenal glands. There is no significant free fluid or lymphadenopathy.  Urinary bladder is decompressed. No gross abnormality to the uterus or adnexal tissues. Degenerative facet disease in lower lumbar spine. No acute bone abnormality.  IMPRESSION: Mild to moderate left hydroureteronephrosis due to a 4 mm stone near  the left ureterovesical junction. There is left perinephric edema.   Electronically Signed   By: Richarda Overlie M.D.   On: 09/01/2013 15:42   US Abdomen Complete  09/01/2013   *RADIOLOGY REPORT*  Clinical Data:  Elevated LFTs  ABDOMINAL ULTRASOUND COMPLETE  Comparison:  None.  Findings:  Gallbladder:  Surgically absent  Common Bile Duct:  Within normal limits in caliber.  8 mm diameter distally.  Liver: Mild diffuse heterogeneity, suspect early fatty infiltration or hepatic steatosis.  No focal hepatic abnormality.  Patent portal vein centrally.  IVC:  Appears normal.  Pancreas:  Nonspecific diffuse increased echogenicity but no focal abnormality or pancreatic duct dilatation.  Spleen:  Within normal limits in size and echotexture.  Right kidney:  Normal in size and parenchymal echogenicity.  No evidence of mass or hydronephrosis.  Left kidney:  Normal in size and parenchymal echogenicity. Distention of the left collecting system compatible with pelviectasis versus mild hydronephrosis.  No clear etiology.  Abdominal Aorta:  No aneurysm identified.  IMPRESSION: Prior cholecystectomy  Mild hepatic steatosis  Left kidney pelviectasis versus mild hydronephrosis.   Original Report Authenticated By: Judie Petit. Shick, M.D.     EKG Interpretation   None       MDM   1. Kidney stone     Patient with flank pain 2/2 kidney stone.  Recent CT shows a 4mm stone.  EXAM:  CT ABDOMEN AND PELVIS WITHOUT CONTRAST  TECHNIQUE:  Multidetector CT imaging of the abdomen and pelvis was performed  following the standard protocol without intravenous contrast.  COMPARISON: None.  FINDINGS:  The lung bases are clear. No evidence for free air.  There is a 4 mm stone in the distal left ureter near the  ureterovesical junction. Dilatation of the left ureter and left  renal collecting system. There is edema around the left kidney and  the left ureter. There are no other definite kidney stones. Normal  appearance of the right  kidney without hydronephrosis or stones.  The gallbladder has been removed and there is no gross abnormality  of the liver, spleen, pancreas or adrenal glands. There is no  significant free fluid or lymphadenopathy.  Urinary bladder is decompressed. No gross abnormality to the uterus  or adnexal tissues. Degenerative facet disease in lower lumbar  spine. No acute bone abnormality.  IMPRESSION:  Mild to moderate left hydroureteronephrosis due to a 4 mm stone near  the left ureterovesical junction. There is left perinephric edema.  8:35 PM Will give pain meds, fluids, and check UA.  9:59 PM Patient states that her pain is 0/10 after toradol.  UA is nitrite positive. Will consult urology.  10:06 PM Patient discussed with Dr. Isabel Caprice of [facility].  He does not recommend antibiotic treatment at this time because that patient only has 0-2 WBC in the UA and the patient has not had any fevers, chills, nausea, or vomiting.  Recommends discharge and follow-up in their clinic.  May follow-up sooner if needed.  Discussed the patient with Dr. Romeo Apple who acknowledges Dr. Ellin Goodie recommendation, however, we will start the patient on abx for UTI.  DC to home.  Roxy Horseman, PA-C 09/08/13 2235

## 2013-09-08 NOTE — ED Notes (Signed)
Pt states her pain is much better,  Pt is able to lay in bed now without thrashing around

## 2013-09-08 NOTE — ED Notes (Signed)
Pt is aware of the need for a urine sample, however state she is unable to provide on at this time.

## 2013-09-08 NOTE — ED Notes (Signed)
Pt reports she had a CT scan that found a 4mm kidney stone to the left side at [facility] imaging.  Pt had remained without pain from Thursday until yesterday.  Pt denies nausea with use of medication.

## 2013-09-09 NOTE — ED Provider Notes (Signed)
Medical screening examination/treatment/procedure(s) were performed by non-physician practitioner and as supervising physician I was immediately available for consultation/collaboration.    Junius Argyle, MD 09/09/13 1146

## 2013-09-10 LAB — URINE CULTURE
Colony Count: NO GROWTH
Culture: NO GROWTH

## 2013-09-24 ENCOUNTER — Other Ambulatory Visit: Payer: Self-pay

## 2013-11-03 ENCOUNTER — Encounter: Payer: Self-pay | Admitting: Physician Assistant

## 2013-11-03 DIAGNOSIS — R7303 Prediabetes: Secondary | ICD-10-CM

## 2013-11-03 DIAGNOSIS — E1159 Type 2 diabetes mellitus with other circulatory complications: Secondary | ICD-10-CM | POA: Insufficient documentation

## 2013-11-03 DIAGNOSIS — I10 Essential (primary) hypertension: Secondary | ICD-10-CM

## 2013-11-03 DIAGNOSIS — E119 Type 2 diabetes mellitus without complications: Secondary | ICD-10-CM | POA: Insufficient documentation

## 2013-11-03 DIAGNOSIS — E785 Hyperlipidemia, unspecified: Secondary | ICD-10-CM | POA: Insufficient documentation

## 2013-11-03 DIAGNOSIS — E559 Vitamin D deficiency, unspecified: Secondary | ICD-10-CM | POA: Insufficient documentation

## 2013-11-03 DIAGNOSIS — E1169 Type 2 diabetes mellitus with other specified complication: Secondary | ICD-10-CM | POA: Insufficient documentation

## 2013-11-03 DIAGNOSIS — E079 Disorder of thyroid, unspecified: Secondary | ICD-10-CM

## 2013-11-05 ENCOUNTER — Ambulatory Visit: Payer: Self-pay | Admitting: Physician Assistant

## 2013-12-24 ENCOUNTER — Encounter: Payer: Self-pay | Admitting: Physician Assistant

## 2014-02-01 ENCOUNTER — Encounter: Payer: Self-pay | Admitting: Physician Assistant

## 2014-02-01 ENCOUNTER — Ambulatory Visit (INDEPENDENT_AMBULATORY_CARE_PROVIDER_SITE_OTHER): Payer: 59 | Admitting: Physician Assistant

## 2014-02-01 VITALS — BP 122/84 | HR 80 | Temp 99.0°F | Resp 16 | Ht 64.0 in | Wt 216.0 lb

## 2014-02-01 DIAGNOSIS — I10 Essential (primary) hypertension: Secondary | ICD-10-CM

## 2014-02-01 DIAGNOSIS — Z79899 Other long term (current) drug therapy: Secondary | ICD-10-CM

## 2014-02-01 DIAGNOSIS — E785 Hyperlipidemia, unspecified: Secondary | ICD-10-CM

## 2014-02-01 DIAGNOSIS — R7309 Other abnormal glucose: Secondary | ICD-10-CM

## 2014-02-01 DIAGNOSIS — E559 Vitamin D deficiency, unspecified: Secondary | ICD-10-CM

## 2014-02-01 DIAGNOSIS — E079 Disorder of thyroid, unspecified: Secondary | ICD-10-CM

## 2014-02-01 DIAGNOSIS — R7303 Prediabetes: Secondary | ICD-10-CM

## 2014-02-01 MED ORDER — PHENTERMINE HCL 37.5 MG PO TABS: 37.5000 mg | ORAL_TABLET | Freq: Every day | ORAL | Status: DC

## 2014-02-01 NOTE — Patient Instructions (Signed)
Phentermine  While taking the medication we will ask that you come into the office once a month to monitor your weight, blood pressure, and heart rate. In addition we can help answer your questions about diet, exercise, and help you every step of the way with your weight loss journey. Sometime it is helpful if you bring in a food diary or use an app on your phone such as myfitnesspal to record your calorie intake, especially in the beginning.   You can start out on 1/3 to 1/2 a pill in the morning and if you are tolerating it well you can increase to one pill daily.   What is this medicine? PHENTERMINE (FEN ter meen) decreases your appetite. This medicine is intended to be used in addition to a healthy reduced calorie diet and exercise. The best results are achieved this way. This medicine is only indicated for short-term use. Eventually your weight loss may level out and the medication will no longer be needed.   How should I use this medicine? Take this medicine by mouth. Follow the directions on the prescription label. The tablets should stay in the bottle until immediately before you take your dose. Take your doses at regular intervals. Do not take your medicine more often than directed.  Overdosage: If you think you have taken too much of this medicine contact a poison control center or emergency room at once. NOTE: This medicine is only for you. Do not share this medicine with others.  What if I miss a dose? If you miss a dose, take it as soon as you can. If it is almost time for your next dose, take only that dose. Do not take double or extra doses. Do not increase or in any way change your dose without consulting your doctor.  What should I watch for while using this medicine? Notify your physician immediately if you become short of breath while doing your normal activities. Do not take this medicine within 6 hours of bedtime. It can keep you from getting to sleep. Avoid drinks that contain  caffeine and try to stick to a regular bedtime every night. Do not stand or sit up quickly, especially if you are an older patient. This reduces the risk of dizzy or fainting spells. Avoid alcoholic drinks.  What side effects may I notice from receiving this medicine? Side effects that you should report to your doctor or health care professional as soon as possible: -chest pain, palpitations -depression or severe changes in mood -increased blood pressure -irritability -nervousness or restlessness -severe dizziness -shortness of breath -problems urinating -unusual swelling of the legs -vomiting  Side effects that usually do not require medical attention (report to your doctor or health care professional if they continue or are bothersome): -blurred vision or other eye problems -changes in sexual ability or desire -constipation or diarrhea -difficulty sleeping -dry mouth or unpleasant taste -headache -nausea This list may not describe all possible side effects. Call your doctor for medical advice about side effects. You may report side effects to FDA at 1-800-FDA-1088.

## 2014-02-01 NOTE — Progress Notes (Signed)
HPI 53 y.o. female  presents for 3 month follow up with hypertension, hyperlipidemia, prediabetes and vitamin D. Her blood pressure has been controlled at home, today their BP is BP: 122/84 mmHg She does workout, walking but has not got back to the [facility] yet. She denies chest pain, shortness of breath, dizziness.  She is not on cholesterol medication and denies myalgias. Her cholesterol is at goal. The cholesterol last visit was:  LDL 84 She has been working on diet and exercise for prediabetes, and denies foot ulcerations, hypoglycemia , nausea, paresthesia of the feet, polydipsia and polyuria. Last A1C in the office was: 5.9 Patient is on Vitamin D supplement.   Fatty liver via [place] with last AST/ALT being 71/55, Neg Hep panel, neg ceruloplasmin, anti DNA and neg antismooth.   Current Medications:  Current Outpatient Prescriptions on File Prior to Visit  Medication Sig Dispense Refill  . ALPRAZolam (XANAX) 0.5 MG tablet Take 0.5 mg by mouth at bedtime as needed for anxiety.      Marland Kitchen buPROPion (WELLBUTRIN XL) 300 MG 24 hr tablet Take 300 mg by mouth daily.      . cefpodoxime (VANTIN) 100 MG tablet Take 1 tablet (100 mg total) by mouth 2 (two) times daily.  14 tablet  0  . cholecalciferol (VITAMIN D) 1000 UNITS tablet Take 10,000 Units by mouth daily.       Marland Kitchen diltiazem (DILACOR XR) 240 MG 24 hr capsule Take 240 mg by mouth daily.      . hydrochlorothiazide (HYDRODIURIL) 25 MG tablet Take 25 mg by mouth daily.      Marland Kitchen ibuprofen (ADVIL,MOTRIN) 600 MG tablet Take 1 tablet (600 mg total) by mouth every 8 (eight) hours as needed for pain.  21 tablet  0  . levothyroxine (SYNTHROID, LEVOTHROID) 100 MCG tablet Take 100 mcg by mouth daily.      Marland Kitchen oxyCODONE-acetaminophen (PERCOCET/ROXICET) 5-325 MG per tablet Take 2 tablets by mouth every 6 (six) hours as needed for pain.  15 tablet  0  . phenazopyridine (PYRIDIUM) 200 MG tablet Take 200 mg by mouth 3 (three) times daily as needed for pain.      . promethazine  (PHENERGAN) 25 MG tablet Take 25 mg by mouth every 8 (eight) hours as needed for nausea (nausesa).      . quinapril (ACCUPRIL) 40 MG tablet Take 40 mg by mouth daily.      . tamsulosin (FLOMAX) 0.4 MG CAPS capsule Take by mouth daily.      . traMADol (ULTRAM) 50 MG tablet Take 50 mg by mouth every 4 (four) hours as needed for pain (pain).       No current facility-administered medications on file prior to visit.   Medical History:  Past Medical History  Diagnosis Date  . Allergy   . Thyroid disease   . Hypertension   . Hyperlipidemia   . Insomnia   . Vitamin D deficiency   . Nephrolithiasis   . GERD (gastroesophageal reflux disease)   . Fatty liver disease, nonalcoholic 96/2952    Via U/S  . Prediabetes    Allergies:  Allergies  Allergen Reactions  . Atenolol     Beta blockers  . Codeine Nausea Only  . Klonopin [Clonazepam]   . Penicillins Rash     Review of Systems: [X]  = complains of  [ ]  = denies  General: Fatigue [ ]  Fever [ ]  Chills [ ]  Weakness [ ]   Insomnia [ ]  Eyes: Redness [ ]   Blurred vision [ ]  Diplopia [ ]   ENT: Congestion [ ]  Sinus Pain [ ]  Post Nasal Drip [ ]  Sore Throat [ ]  Earache [ ]   Cardiac: Chest pain/pressure [ ]  SOB [ ]  Orthopnea [ ]   Palpitations [ ]   Paroxysmal nocturnal dyspnea[ ]  Claudication [ ]  Edema [ ]   Pulmonary: Cough [ ]  Wheezing[ ]   SOB [ ]   Snoring [ ]   GI: Nausea [ ]  Vomiting[ ]  Dysphagia[ ]  Heartburn[ ]  Abdominal pain [ ]  Constipation [ ] ; Diarrhea [ ] ; BRBPR [ ]  Melena[ ]  GU: Hematuria[ ]  Dysuria [ ]  Nocturia[ ]  Urgency [ ]   Hesitancy [ ]  Discharge [ ]  Neuro: Headaches[ ]  Vertigo[ ]  Paresthesias[ ]  Spasm [ ]  Speech changes [ ]  Incoordination [ ]   Ortho: Arthritis [ ]  Joint pain [ ]  Muscle pain [ ]  Joint swelling [ ]  Back Pain [ ]  Skin:  Rash [ ]   Pruritis [ ]  Change in skin lesion [ ]   Psych: Depression[ ]  Anxiety[ ]  Confusion [ ]  Memory loss [ ]   Heme/Lypmh: Bleeding [ ]  Bruising [ ]  Enlarged lymph nodes [ ]   Endocrine: Visual  blurring [ ]  Paresthesia [ ]  Polyuria [ ]  Polydypsea [ ]    Heat/cold intolerance [ ]  Hypoglycemia [ ]   Family history- Review and unchanged Social history- Review and unchanged Physical Exam: BP 122/84  Pulse 80  Temp(Src) 99 F (37.2 C)  Resp 16  Ht 5\' 4"  (1.626 m)  Wt 216 lb (97.977 kg)  BMI 37.06 kg/m2 Wt Readings from Last 3 Encounters:  02/01/14 216 lb (97.977 kg)  03/31/13 212 lb (96.163 kg)   General Appearance: Well nourished, in no apparent distress. Eyes: PERRLA, EOMs, conjunctiva no swelling or erythema Sinuses: No Frontal/maxillary tenderness ENT/Mouth: Ext aud canals clear, TMs without erythema, bulging. No erythema, swelling, or exudate on post pharynx.  Tonsils not swollen or erythematous. Hearing normal.  Neck: Supple, thyroid normal.  Respiratory: Respiratory effort normal, BS equal bilaterally without rales, rhonchi, wheezing or stridor.  Cardio: RRR with no MRGs. Brisk peripheral pulses without edema.  Abdomen: Soft, + BS.  Non tender, no guarding, rebound, hernias, masses. Lymphatics: Non tender without lymphadenopathy.  Musculoskeletal: Full ROM, 5/5 strength, normal gait.  Skin: Warm, dry without rashes, lesions, ecchymosis.  Neuro: Cranial nerves intact. Normal muscle tone, no cerebellar symptoms. Sensation intact.  Psych: Awake and oriented X 3, normal affect, Insight and Judgment appropriate.   Assessment and Plan:  Hypertension: Continue medication, monitor blood pressure at home.  Continue DASH diet. Cholesterol: Continue diet and exercise. Check cholesterol.  Pre-diabetes-Continue diet and exercise. Check A1C Vitamin D Def- check level and continue medications.  Obesity- Obesity with co morbidities- long discussion about weight loss, diet, and exercise  Will start the patient on phentermine- hand out given  Follow up in 1 month with food diary Fatty liver- weight loss advised.   Continue diet and meds as discussed. Further disposition pending  results of labs.  Vicie Mutters 4:00 PM

## 2014-02-02 LAB — BASIC METABOLIC PANEL WITH GFR
BUN: 10 mg/dL (ref 6–23)
CHLORIDE: 99 meq/L (ref 96–112)
CO2: 26 mEq/L (ref 19–32)
CREATININE: 0.63 mg/dL (ref 0.50–1.10)
Calcium: 9.7 mg/dL (ref 8.4–10.5)
GFR, Est Non African American: >89 mL/min
Glucose, Bld: 93 mg/dL (ref 70–99)
POTASSIUM: 4 meq/L (ref 3.5–5.3)
Sodium: 138 mEq/L (ref 135–145)

## 2014-02-02 LAB — LIPID PANEL
Cholesterol: 191 mg/dL (ref 0–200)
HDL: 45 mg/dL (ref >39)
LDL Cholesterol: 116 mg/dL — ABNORMAL HIGH (ref 0–99)
Total CHOL/HDL Ratio: 4.2 Ratio
Triglycerides: 149 mg/dL (ref <150)
VLDL: 30 mg/dL (ref 0–40)

## 2014-02-02 LAB — HEPATIC FUNCTION PANEL
ALK PHOS: 78 U/L (ref 39–117)
ALT: 51 U/L — AB (ref 0–35)
AST: 49 U/L — ABNORMAL HIGH (ref 0–37)
Albumin: 4.5 g/dL (ref 3.5–5.2)
BILIRUBIN DIRECT: 0.1 mg/dL (ref 0.0–0.3)
BILIRUBIN TOTAL: 0.5 mg/dL (ref 0.2–1.2)
Indirect Bilirubin: 0.4 mg/dL (ref 0.2–1.2)
Total Protein: 7.2 g/dL (ref 6.0–8.3)

## 2014-02-02 LAB — CBC WITH DIFFERENTIAL/PLATELET
BASOS ABS: 0.1 10*3/uL (ref 0.0–0.1)
Basophils Relative: 1 % (ref 0–1)
Eosinophils Absolute: 0.4 10*3/uL (ref 0.0–0.7)
Eosinophils Relative: 4 % (ref 0–5)
HCT: 43.7 % (ref 36.0–46.0)
Hemoglobin: 14.9 g/dL (ref 12.0–15.0)
LYMPHS PCT: 34 % (ref 12–46)
Lymphs Abs: 3.1 10*3/uL (ref 0.7–4.0)
MCH: 30.5 pg (ref 26.0–34.0)
MCHC: 34.1 g/dL (ref 30.0–36.0)
MCV: 89.4 fL (ref 78.0–100.0)
MONO ABS: 0.7 10*3/uL (ref 0.1–1.0)
Monocytes Relative: 8 % (ref 3–12)
NEUTROS ABS: 4.8 10*3/uL (ref 1.7–7.7)
Neutrophils Relative %: 53 % (ref 43–77)
PLATELETS: 317 10*3/uL (ref 150–400)
RBC: 4.89 MIL/uL (ref 3.87–5.11)
RDW: 13.9 % (ref 11.5–15.5)
WBC: 9 10*3/uL (ref 4.0–10.5)

## 2014-02-02 LAB — TSH: TSH: 3.039 u[IU]/mL (ref 0.350–4.500)

## 2014-02-02 LAB — INSULIN, FASTING: INSULIN FASTING, SERUM: 20 u[IU]/mL (ref 3–28)

## 2014-02-02 LAB — VITAMIN D 25 HYDROXY (VIT D DEFICIENCY, FRACTURES): VIT D 25 HYDROXY: 94 ng/mL — AB (ref 30–89)

## 2014-02-02 LAB — HEMOGLOBIN A1C
Hgb A1c MFr Bld: 6.1 % — ABNORMAL HIGH (ref <5.7)
Mean Plasma Glucose: 128 mg/dL — ABNORMAL HIGH (ref <117)

## 2014-02-02 LAB — MAGNESIUM: Magnesium: 2 mg/dL (ref 1.5–2.5)

## 2014-02-24 ENCOUNTER — Other Ambulatory Visit: Payer: Self-pay

## 2014-02-24 DIAGNOSIS — Z1231 Encounter for screening mammogram for malignant neoplasm of breast: Secondary | ICD-10-CM

## 2014-03-01 ENCOUNTER — Encounter: Payer: Self-pay | Admitting: Physician Assistant

## 2014-03-01 ENCOUNTER — Ambulatory Visit (INDEPENDENT_AMBULATORY_CARE_PROVIDER_SITE_OTHER): Payer: 59 | Admitting: Emergency Medicine

## 2014-03-01 VITALS — BP 132/82 | HR 68 | Temp 98.1°F | Resp 16 | Ht 64.0 in | Wt 208.0 lb

## 2014-03-01 DIAGNOSIS — I10 Essential (primary) hypertension: Secondary | ICD-10-CM

## 2014-03-01 DIAGNOSIS — E669 Obesity, unspecified: Secondary | ICD-10-CM

## 2014-03-01 MED ORDER — PHENTERMINE HCL 37.5 MG PO TABS: 37.5000 mg | ORAL_TABLET | Freq: Every day | ORAL | Status: DC

## 2014-03-01 NOTE — Patient Instructions (Signed)

## 2014-03-01 NOTE — Progress Notes (Signed)
Subjective:    Patient ID: Shannon Pope, female    DOB: [birth year], 53 y.o.   MRN: [id number]  HPI Comments: 53 yo female with weight loss program. She has completed 1 phentermine RX. She was taking 1/2 QD. She is exercising more. She is eating healthier and decreasing portion and increasing H20. She is down 8#.  She has mildly elevated LFT but had improved since October labs.  ALT           51   02/01/2014 AST           49   02/01/2014 ALKPHOS       78   02/01/2014 BILITOT      0.5   02/01/2014   Current Outpatient Prescriptions on File Prior to Visit  Medication Sig Dispense Refill  . ALPRAZolam (XANAX) 0.5 MG tablet Take 0.5 mg by mouth at bedtime as needed for anxiety.      Marland Kitchen buPROPion (WELLBUTRIN XL) 300 MG 24 hr tablet Take 300 mg by mouth daily.      . cefpodoxime (VANTIN) 100 MG tablet Take 1 tablet (100 mg total) by mouth 2 (two) times daily.  14 tablet  0  . cholecalciferol (VITAMIN D) 1000 UNITS tablet Take 10,000 Units by mouth daily.       Marland Kitchen diltiazem (DILACOR XR) 240 MG 24 hr capsule Take 240 mg by mouth daily.      . hydrochlorothiazide (HYDRODIURIL) 25 MG tablet Take 25 mg by mouth daily.      Marland Kitchen ibuprofen (ADVIL,MOTRIN) 600 MG tablet Take 1 tablet (600 mg total) by mouth every 8 (eight) hours as needed for pain.  21 tablet  0  . levothyroxine (SYNTHROID, LEVOTHROID) 100 MCG tablet Take 100 mcg by mouth daily.      Marland Kitchen oxyCODONE-acetaminophen (PERCOCET/ROXICET) 5-325 MG per tablet Take 2 tablets by mouth every 6 (six) hours as needed for pain.  15 tablet  0  . phenazopyridine (PYRIDIUM) 200 MG tablet Take 200 mg by mouth 3 (three) times daily as needed for pain.      . promethazine (PHENERGAN) 25 MG tablet Take 25 mg by mouth every 8 (eight) hours as needed for nausea (nausesa).      . quinapril (ACCUPRIL) 40 MG tablet Take 40 mg by mouth daily.      . tamsulosin (FLOMAX) 0.4 MG CAPS capsule Take by mouth daily.      . traMADol (ULTRAM) 50 MG tablet Take 50 mg by mouth  every 4 (four) hours as needed for pain (pain).       No current facility-administered medications on file prior to visit.   Allergies  Allergen Reactions  . Atenolol     Beta blockers  . Codeine Nausea Only  . Klonopin [Clonazepam]   . Penicillins Rash   Past Medical History  Diagnosis Date  . Allergy   . Thyroid disease   . Hypertension   . Hyperlipidemia   . Insomnia   . Vitamin D deficiency   . Nephrolithiasis   . GERD (gastroesophageal reflux disease)   . Fatty liver disease, nonalcoholic 08/2584    [facility]  . Prediabetes       Review of Systems  All other systems reviewed and are negative.  BP 132/82  Pulse 68  Temp(Src) 98.1 F (36.7 C)  Resp 16  Ht 5\' 4"  (1.626 m)  Wt 208 lb (94.348 kg)  BMI 35.69 kg/m2     Objective:   Physical Exam  Nursing note and vitals reviewed. Constitutional: She is oriented to person, place, and time. She appears well-developed and well-nourished. No distress.  obese  HENT:  Head: Normocephalic and atraumatic.  Right Ear: External ear normal.  Left Ear: External ear normal.  Nose: Nose normal.  Mouth/Throat: Oropharynx is clear and moist.  Eyes: Conjunctivae and EOM are normal.  Neck: Normal range of motion. Neck supple. No thyromegaly present.  Cardiovascular: Normal rate, regular rhythm, normal heart sounds and intact distal pulses.   Pulmonary/Chest: Effort normal and breath sounds normal.  Abdominal: Soft. Bowel sounds are normal. She exhibits no distension. There is no tenderness.  Musculoskeletal: Normal range of motion. She exhibits no edema and no tenderness.  Lymphadenopathy:    She has no cervical adenopathy.  Neurological: She is alert and oriented to person, place, and time. No cranial nerve deficit.  Skin: Skin is warm and dry.  Psychiatric: She has a normal mood and affect. Her behavior is normal. Judgment and thought content normal.          Assessment & Plan:  1. Obesity- Continue weight loss,  increase activity and better diet. Pt aware of risks. Check labs. Refill Phentermine 37.5 mg #2 RX for 30 pills, try to take 1/2, increase protein and fiber intake  2. HTN- Check BP call if >130/80, increase cardio

## 2014-03-02 LAB — HEPATIC FUNCTION PANEL
ALT: 76 U/L — ABNORMAL HIGH (ref 0–35)
AST: 69 U/L — ABNORMAL HIGH (ref 0–37)
Albumin: 4.5 g/dL (ref 3.5–5.2)
Alkaline Phosphatase: 79 U/L (ref 39–117)
BILIRUBIN DIRECT: 0.1 mg/dL (ref 0.0–0.3)
BILIRUBIN INDIRECT: 0.5 mg/dL (ref 0.2–1.2)
TOTAL PROTEIN: 6.9 g/dL (ref 6.0–8.3)
Total Bilirubin: 0.6 mg/dL (ref 0.2–1.2)

## 2014-03-02 LAB — BASIC METABOLIC PANEL WITH GFR
BUN: 10 mg/dL (ref 6–23)
CHLORIDE: 99 meq/L (ref 96–112)
CO2: 26 mEq/L (ref 19–32)
Calcium: 9.8 mg/dL (ref 8.4–10.5)
Creat: 0.6 mg/dL (ref 0.50–1.10)
GFR, Est African American: >89 mL/min
Glucose, Bld: 94 mg/dL (ref 70–99)
POTASSIUM: 4.1 meq/L (ref 3.5–5.3)
Sodium: 137 mEq/L (ref 135–145)

## 2014-03-03 ENCOUNTER — Encounter: Payer: Self-pay | Admitting: Physician Assistant

## 2014-03-15 ENCOUNTER — Other Ambulatory Visit: Payer: Self-pay

## 2014-03-15 ENCOUNTER — Other Ambulatory Visit: Payer: Self-pay | Admitting: Emergency Medicine

## 2014-03-15 DIAGNOSIS — R6889 Other general symptoms and signs: Secondary | ICD-10-CM

## 2014-03-16 ENCOUNTER — Other Ambulatory Visit: Payer: Self-pay

## 2014-03-17 ENCOUNTER — Ambulatory Visit
Admission: RE | Admit: 2014-03-17 | Discharge: 2014-03-17 | Disposition: A | Discharge status: Home / Self Care | Payer: 59 | Source: Ambulatory Visit

## 2014-03-17 DIAGNOSIS — Z1231 Encounter for screening mammogram for malignant neoplasm of breast: Secondary | ICD-10-CM

## 2014-03-18 ENCOUNTER — Other Ambulatory Visit: Payer: Self-pay

## 2014-03-18 ENCOUNTER — Other Ambulatory Visit: Payer: 59

## 2014-03-18 DIAGNOSIS — R6889 Other general symptoms and signs: Secondary | ICD-10-CM

## 2014-03-18 LAB — HEPATIC FUNCTION PANEL
ALK PHOS: 84 U/L (ref 39–117)
ALT: 63 U/L — AB (ref 0–35)
AST: 57 U/L — AB (ref 0–37)
Albumin: 4.5 g/dL (ref 3.5–5.2)
BILIRUBIN INDIRECT: 0.4 mg/dL (ref 0.2–1.2)
Bilirubin, Direct: 0.1 mg/dL (ref 0.0–0.3)
Total Bilirubin: 0.5 mg/dL (ref 0.2–1.2)
Total Protein: 7.1 g/dL (ref 6.0–8.3)

## 2014-03-19 ENCOUNTER — Other Ambulatory Visit: Payer: Self-pay | Admitting: Emergency Medicine

## 2014-03-19 DIAGNOSIS — R945 Abnormal results of liver function studies: Secondary | ICD-10-CM

## 2014-03-19 DIAGNOSIS — R7989 Other specified abnormal findings of blood chemistry: Secondary | ICD-10-CM

## 2014-03-25 ENCOUNTER — Encounter: Payer: Self-pay | Admitting: Gastroenterology

## 2014-03-31 ENCOUNTER — Other Ambulatory Visit: Payer: Self-pay

## 2014-03-31 DIAGNOSIS — E669 Obesity, unspecified: Secondary | ICD-10-CM

## 2014-03-31 MED ORDER — PHENTERMINE HCL 37.5 MG PO TABS: 37.5000 mg | ORAL_TABLET | Freq: Every day | ORAL | Status: DC

## 2014-03-31 NOTE — Telephone Encounter (Signed)
Patient came in office today and advised that she was told to come by and weigh in , no appt needed to get her phentermine refilled, she weighed 198 lbs. She was advised that she misunderstood directions, advised her that Kelby Aline, [place] will do a nurse visit with weigh in for phentermine refills but Vicie Mutters, PA requires a office visit. Per Vicie Mutters, PA filled her phentermine for her this time. Patient aware to schedule for her next refill.

## 2014-04-05 ENCOUNTER — Other Ambulatory Visit: Payer: Self-pay | Admitting: Physician Assistant

## 2014-04-05 DIAGNOSIS — E669 Obesity, unspecified: Secondary | ICD-10-CM

## 2014-04-05 MED ORDER — PHENTERMINE HCL 37.5 MG PO TABS: 37.5000 mg | ORAL_TABLET | Freq: Every day | ORAL | Status: DC

## 2014-04-08 ENCOUNTER — Other Ambulatory Visit: Payer: Self-pay | Admitting: Physician Assistant

## 2014-04-08 ENCOUNTER — Encounter: Payer: Self-pay | Admitting: Physician Assistant

## 2014-04-08 MED ORDER — LEVOTHYROXINE SODIUM 100 MCG PO TABS: 100.0000 ug | ORAL_TABLET | Freq: Every day | ORAL | Status: DC

## 2014-05-26 ENCOUNTER — Ambulatory Visit: Payer: 59 | Admitting: Gastroenterology

## 2014-06-21 ENCOUNTER — Other Ambulatory Visit: Payer: Self-pay | Admitting: Emergency Medicine

## 2014-06-24 ENCOUNTER — Ambulatory Visit (INDEPENDENT_AMBULATORY_CARE_PROVIDER_SITE_OTHER): Payer: 59 | Admitting: Physician Assistant

## 2014-06-24 VITALS — BP 132/82 | HR 72 | Temp 98.1°F | Resp 16 | Ht 64.0 in | Wt 185.0 lb

## 2014-06-24 DIAGNOSIS — E669 Obesity, unspecified: Secondary | ICD-10-CM

## 2014-06-24 MED ORDER — PHENTERMINE HCL 37.5 MG PO TABS: 37.5000 mg | ORAL_TABLET | Freq: Every day | ORAL | Status: DC

## 2014-06-24 NOTE — Progress Notes (Signed)
53 y.o.female presents for a follow up after being on phentermine for weight loss for 5 months. Patient states they have improved meal pattern, decreased fat intake, increased physical activity and better food choices. While on the phentermine they have lost 31 lbs total.  They deny palpitations, anxiety, trouble sleeping, elevated BP.  She has been doing fit bit, and my fitness pal app which has helped significantly.   Wt Readings from Last 3 Encounters:  06/24/14 185 lb (83.915 kg)  03/01/14 208 lb (94.348 kg)  02/01/14 216 lb (97.977 kg)   Typical breakfast: Oatmeal/ grits/ or yogurt/ with almonds/ fruit on the side  Or Sometime boiled eggs Typical lunch: [place] with veggie, chicken and veggie, or salad, does use regular dressing but very little Typical dinner: eats out a lot, salad ,some potatoes  Medications: Current Outpatient Prescriptions on File Prior to Visit  Medication Sig Dispense Refill  . ALPRAZolam (XANAX) 0.5 MG tablet Take 0.5 mg by mouth at bedtime as needed for anxiety.      Marland Kitchen buPROPion (WELLBUTRIN XL) 300 MG 24 hr tablet Take 300 mg by mouth daily.      . cefpodoxime (VANTIN) 100 MG tablet Take 1 tablet (100 mg total) by mouth 2 (two) times daily.  14 tablet  0  . cholecalciferol (VITAMIN D) 1000 UNITS tablet Take 10,000 Units by mouth daily.       Marland Kitchen diltiazem (DILACOR XR) 240 MG 24 hr capsule Take 240 mg by mouth daily.      . hydrochlorothiazide (HYDRODIURIL) 25 MG tablet Take 25 mg by mouth daily.      Marland Kitchen ibuprofen (ADVIL,MOTRIN) 600 MG tablet Take 1 tablet (600 mg total) by mouth every 8 (eight) hours as needed for pain.  21 tablet  0  . levothyroxine (SYNTHROID, LEVOTHROID) 100 MCG tablet Take 1 tablet (100 mcg total) by mouth daily before breakfast.  30 tablet  1  . oxyCODONE-acetaminophen (PERCOCET/ROXICET) 5-325 MG per tablet Take 2 tablets by mouth every 6 (six) hours as needed for pain.  15 tablet  0  . phenazopyridine (PYRIDIUM) 200 MG tablet Take 200 mg by mouth  3 (three) times daily as needed for pain.      . phentermine (ADIPEX-P) 37.5 MG tablet Take 1 tablet (37.5 mg total) by mouth daily before breakfast.  30 tablet  0  . promethazine (PHENERGAN) 25 MG tablet Take 25 mg by mouth every 8 (eight) hours as needed for nausea (nausesa).      . quinapril (ACCUPRIL) 40 MG tablet Take 40 mg by mouth daily.      . tamsulosin (FLOMAX) 0.4 MG CAPS capsule Take by mouth daily.      . traMADol (ULTRAM) 50 MG tablet Take 50 mg by mouth every 4 (four) hours as needed for pain (pain).       No current facility-administered medications on file prior to visit.    ROS: All negative except for above  Physical exam: Filed Vitals:   06/24/14 1144  BP: 132/82  Pulse: 72  Temp: 98.1 F (36.7 C)  Resp: 16   BP 132/82  Pulse 72  Temp(Src) 98.1 F (36.7 C)  Resp 16  Ht 5\' 4"  (1.626 m)  Wt 185 lb (83.915 kg)  BMI 31.74 kg/m2 General appearance: alert Lungs: clear to auscultation bilaterally Heart: regular rate and rhythm, S1, S2 normal, no murmur, click, rub or gallop Abdomen: soft, non-tender; bowel sounds normal; no masses,  no organomegaly Extremities: extremities normal, atraumatic, no  cyanosis or edema  Assessment: Obesity with co morbid conditions.   Plan: General weight loss/lifestyle modification strategies discussed (elicit support from others; identify saboteurs; non-food rewards, etc). Diet interventions: diet diary indefinitely. Regular aerobic exercise program discussed. Medication: phentermine. Follow up in: 3 months and as needed.

## 2014-07-09 ENCOUNTER — Other Ambulatory Visit: Payer: Self-pay | Admitting: *Deleted

## 2014-07-09 DIAGNOSIS — Z1212 Encounter for screening for malignant neoplasm of rectum: Secondary | ICD-10-CM

## 2014-07-09 LAB — POC HEMOCCULT BLD/STL (HOME/3-CARD/SCREEN)
Card #2 Fecal Occult Blod, POC: NEGATIVE
FECAL OCCULT BLD: NEGATIVE
FECAL OCCULT BLD: NEGATIVE

## 2014-07-13 ENCOUNTER — Ambulatory Visit (INDEPENDENT_AMBULATORY_CARE_PROVIDER_SITE_OTHER): Payer: 59 | Admitting: Physician Assistant

## 2014-07-13 ENCOUNTER — Other Ambulatory Visit (HOSPITAL_COMMUNITY)
Admission: RE | Admit: 2014-07-13 | Discharge: 2014-07-13 | Disposition: A | Discharge status: Home / Self Care | Payer: 59 | Source: Ambulatory Visit | Attending: Physician Assistant | Admitting: Physician Assistant

## 2014-07-13 ENCOUNTER — Encounter: Payer: Self-pay | Admitting: Physician Assistant

## 2014-07-13 VITALS — BP 122/72 | HR 68 | Temp 97.9°F | Resp 16 | Ht 64.0 in | Wt 184.0 lb

## 2014-07-13 DIAGNOSIS — Z1151 Encounter for screening for human papillomavirus (HPV): Secondary | ICD-10-CM | POA: Insufficient documentation

## 2014-07-13 DIAGNOSIS — N760 Acute vaginitis: Secondary | ICD-10-CM | POA: Diagnosis present

## 2014-07-13 DIAGNOSIS — Z01419 Encounter for gynecological examination (general) (routine) without abnormal findings: Secondary | ICD-10-CM | POA: Insufficient documentation

## 2014-07-13 DIAGNOSIS — Z124 Encounter for screening for malignant neoplasm of cervix: Secondary | ICD-10-CM

## 2014-07-13 DIAGNOSIS — N898 Other specified noninflammatory disorders of vagina: Secondary | ICD-10-CM

## 2014-07-13 DIAGNOSIS — Z0000 Encounter for general adult medical examination without abnormal findings: Secondary | ICD-10-CM

## 2014-07-13 DIAGNOSIS — I10 Essential (primary) hypertension: Secondary | ICD-10-CM

## 2014-07-13 LAB — CBC WITH DIFFERENTIAL/PLATELET
BASOS ABS: 0.1 10*3/uL (ref 0.0–0.1)
BASOS PCT: 1 % (ref 0–1)
Eosinophils Absolute: 0.3 10*3/uL (ref 0.0–0.7)
Eosinophils Relative: 4 % (ref 0–5)
HCT: 43.8 % (ref 36.0–46.0)
HEMOGLOBIN: 15 g/dL (ref 12.0–15.0)
Lymphocytes Relative: 25 % (ref 12–46)
Lymphs Abs: 1.8 10*3/uL (ref 0.7–4.0)
MCH: 30.4 pg (ref 26.0–34.0)
MCHC: 34.2 g/dL (ref 30.0–36.0)
MCV: 88.7 fL (ref 78.0–100.0)
Monocytes Absolute: 0.6 10*3/uL (ref 0.1–1.0)
Monocytes Relative: 9 % (ref 3–12)
NEUTROS ABS: 4.3 10*3/uL (ref 1.7–7.7)
NEUTROS PCT: 61 % (ref 43–77)
Platelets: 347 10*3/uL (ref 150–400)
RBC: 4.94 MIL/uL (ref 3.87–5.11)
RDW: 13 % (ref 11.5–15.5)
WBC: 7.1 10*3/uL (ref 4.0–10.5)

## 2014-07-13 LAB — URINALYSIS, ROUTINE W REFLEX MICROSCOPIC
Bilirubin Urine: NEGATIVE
Glucose, UA: NEGATIVE mg/dL
Ketones, ur: NEGATIVE mg/dL
Leukocytes, UA: NEGATIVE
Nitrite: NEGATIVE
Protein, ur: NEGATIVE mg/dL
Specific Gravity, Urine: 1.022 (ref 1.005–1.030)
Urobilinogen, UA: 0.2 mg/dL (ref 0.0–1.0)
pH: 5.5 (ref 5.0–8.0)

## 2014-07-13 LAB — IRON AND TIBC
%SAT: 30 % (ref 20–55)
Iron: 94 ug/dL (ref 42–145)
TIBC: 309 ug/dL (ref 250–470)
UIBC: 215 ug/dL (ref 125–400)

## 2014-07-13 LAB — MAGNESIUM: Magnesium: 2.1 mg/dL (ref 1.5–2.5)

## 2014-07-13 LAB — MICROALBUMIN / CREATININE URINE RATIO
Creatinine, Urine: 186.6 mg/dL
Microalb Creat Ratio: 11.3 mg/g (ref 0.0–30.0)
Microalb, Ur: 2.11 mg/dL — ABNORMAL HIGH (ref 0.00–1.89)

## 2014-07-13 LAB — VITAMIN B12: VITAMIN B 12: 1908 pg/mL — AB (ref 211–911)

## 2014-07-13 LAB — HEPATIC FUNCTION PANEL
ALT: 30 U/L (ref 0–35)
AST: 31 U/L (ref 0–37)
Albumin: 4.5 g/dL (ref 3.5–5.2)
Alkaline Phosphatase: 89 U/L (ref 39–117)
BILIRUBIN TOTAL: 0.7 mg/dL (ref 0.2–1.2)
Bilirubin, Direct: 0.2 mg/dL (ref 0.0–0.3)
Indirect Bilirubin: 0.5 mg/dL (ref 0.2–1.2)
Total Protein: 7.1 g/dL (ref 6.0–8.3)

## 2014-07-13 LAB — BASIC METABOLIC PANEL WITH GFR
BUN: 12 mg/dL (ref 6–23)
CO2: 28 mEq/L (ref 19–32)
Calcium: 9.7 mg/dL (ref 8.4–10.5)
Chloride: 100 mEq/L (ref 96–112)
Creat: 0.68 mg/dL (ref 0.50–1.10)
GLUCOSE: 101 mg/dL — AB (ref 70–99)
POTASSIUM: 4.2 meq/L (ref 3.5–5.3)
Sodium: 136 mEq/L (ref 135–145)

## 2014-07-13 LAB — URINALYSIS, MICROSCOPIC ONLY
Bacteria, UA: NONE SEEN
Casts: NONE SEEN
RBC / HPF: >50 RBC/hpf — AB (ref <3)

## 2014-07-13 LAB — LIPID PANEL
CHOL/HDL RATIO: 3.6 ratio
Cholesterol: 161 mg/dL (ref 0–200)
HDL: 45 mg/dL (ref >39)
LDL CALC: 90 mg/dL (ref 0–99)
Triglycerides: 130 mg/dL (ref <150)
VLDL: 26 mg/dL (ref 0–40)

## 2014-07-13 LAB — TSH: TSH: 0.653 u[IU]/mL (ref 0.350–4.500)

## 2014-07-13 LAB — HEMOGLOBIN A1C
HEMOGLOBIN A1C: 5.9 % — AB (ref <5.7)
Mean Plasma Glucose: 123 mg/dL — ABNORMAL HIGH (ref <117)

## 2014-07-13 LAB — FERRITIN: Ferritin: 85 ng/mL (ref 10–291)

## 2014-07-13 NOTE — Patient Instructions (Addendum)
Preventative Care for Adults - Female      MAINTAIN REGULAR HEALTH EXAMS:  A routine yearly physical is a good way to check in with your primary care provider about your health and preventive screening. It is also an opportunity to share updates about your health and any concerns you have, and receive a thorough all-over exam.   Most health insurance companies pay for at least some preventative services.  Check with your health plan for specific coverages.  WHAT PREVENTATIVE SERVICES DO WOMEN NEED?  Adult women should have their weight and blood pressure checked regularly.   Women age 35 and older should have their cholesterol levels checked regularly.  Women should be screened for cervical cancer with a Pap smear and pelvic exam beginning at either age 21, or 3 years after they become sexually activity.    Breast cancer screening generally begins at age 40 with a mammogram and breast exam by your primary care provider.    Beginning at age 50 and continuing to age 75, women should be screened for colorectal cancer.  Certain people may need continued testing until age 85.  Updating vaccinations is part of preventative care.  Vaccinations help protect against diseases such as the flu.  Osteoporosis is a disease in which the bones lose minerals and strength as we age. Women ages 65 and over should discuss this with their caregivers, as should women after menopause who have other risk factors.  Lab tests are generally done as part of preventative care to screen for anemia and blood disorders, to screen for problems with the kidneys and liver, to screen for bladder problems, to check blood sugar, and to check your cholesterol level.  Preventative services generally include counseling about diet, exercise, avoiding tobacco, drugs, excessive alcohol consumption, and sexually transmitted infections.    GENERAL RECOMMENDATIONS FOR GOOD HEALTH:  Healthy diet:  Eat a variety of foods, including  fruit, vegetables, animal or vegetable protein, such as meat, fish, chicken, and eggs, or beans, lentils, tofu, and grains, such as rice.  Drink plenty of water daily.  Decrease saturated fat in the diet, avoid lots of red meat, processed foods, sweets, fast foods, and fried foods.  Exercise:  Aerobic exercise helps maintain good heart health. At least 30-40 minutes of moderate-intensity exercise is recommended. For example, a brisk walk that increases your heart rate and breathing. This should be done on most days of the week.   Find a type of exercise or a variety of exercises that you enjoy so that it becomes a part of your daily life.  Examples are running, walking, swimming, water aerobics, and biking.  For motivation and support, explore group exercise such as aerobic class, spin class, Zumba, Yoga,or  martial arts, etc.    Set exercise goals for yourself, such as a certain weight goal, walk or run in a race such as a 5k walk/run.  Speak to your primary care provider about exercise goals.  Disease prevention:  If you smoke or chew tobacco, find out from your caregiver how to quit. It can literally save your life, no matter how long you have been a tobacco user. If you do not use tobacco, never begin.   Maintain a healthy diet and normal weight. Increased weight leads to problems with blood pressure and diabetes.   The Body Mass Index or BMI is a way of measuring how much of your body is fat. Having a BMI above 27 increases the risk of heart disease,   diabetes, hypertension, stroke and other problems related to obesity. Your caregiver can help determine your BMI and based on it develop an exercise and dietary program to help you achieve or maintain this important measurement at a healthful level.  High blood pressure causes heart and blood vessel problems.  Persistent high blood pressure should be treated with medicine if weight loss and exercise do not work.   Fat and cholesterol leaves  deposits in your arteries that can block them. This causes heart disease and vessel disease elsewhere in your body.  If your cholesterol is found to be high, or if you have heart disease or certain other medical conditions, then you may need to have your cholesterol monitored frequently and be treated with medication.   Ask if you should have a cardiac stress test if your history suggests this. A stress test is a test done on a treadmill that looks for heart disease. This test can find disease prior to there being a problem.  Menopause can be associated with physical symptoms and risks. Hormone replacement therapy is available to decrease these. You should talk to your caregiver about whether starting or continuing to take hormones is right for you.   Osteoporosis is a disease in which the bones lose minerals and strength as we age. This can result in serious bone fractures. Risk of osteoporosis can be identified using a bone density scan. Women ages 65 and over should discuss this with their caregivers, as should women after menopause who have other risk factors. Ask your caregiver whether you should be taking a calcium supplement and Vitamin D, to reduce the rate of osteoporosis.   Avoid drinking alcohol in excess (more than two drinks per day).  Avoid use of street drugs. Do not share needles with anyone. Ask for professional help if you need assistance or instructions on stopping the use of alcohol, cigarettes, and/or drugs.  Brush your teeth twice a day with fluoride toothpaste, and floss once a day. Good oral hygiene prevents tooth decay and gum disease. The problems can be painful, unattractive, and can cause other health problems. Visit your dentist for a routine oral and dental check up and preventive care every 6-12 months.   Look at your skin regularly.  Use a mirror to look at your back. Notify your caregivers of changes in moles, especially if there are changes in shapes, colors, a size  larger than a pencil eraser, an irregular border, or development of new moles.  Safety:  Use seatbelts 100% of the time, whether driving or as a passenger.  Use safety devices such as hearing protection if you work in environments with loud noise or significant background noise.  Use safety glasses when doing any work that could send debris in to the eyes.  Use a helmet if you ride a bike or motorcycle.  Use appropriate safety gear for contact sports.  Talk to your caregiver about gun safety.  Use sunscreen with a SPF (or skin protection factor) of 15 or greater.  Lighter skinned people are at a greater risk of skin cancer. Don't forget to also wear sunglasses in order to protect your eyes from too much damaging sunlight. Damaging sunlight can accelerate cataract formation.   Practice safe sex. Use condoms. Condoms are used for birth control and to help reduce the spread of sexually transmitted infections (or STIs).  Some of the STIs are gonorrhea (the clap), chlamydia, syphilis, trichomonas, herpes, HPV (human papilloma virus) and HIV (human immunodeficiency virus)   which causes AIDS. The herpes, HIV and HPV are viral illnesses that have no cure. These can result in disability, cancer and death.   Keep carbon monoxide and smoke detectors in your home functioning at all times. Change the batteries every 6 months or use a model that plugs into the wall.   Vaccinations:  Stay up to date with your tetanus shots and other required immunizations. You should have a booster for tetanus every 10 years. Be sure to get your flu shot every year, since 5%-20% of the [place] population comes down with the flu. The flu vaccine changes each year, so being vaccinated once is not enough. Get your shot in the fall, before the flu season peaks.   Other vaccines to consider:  Human Papilloma Virus or HPV causes cancer of the cervix, and other infections that can be transmitted from person to person. There is a vaccine for  HPV, and females should get immunized between the ages of 50 and 26. It requires a series of 3 shots.   Pneumococcal vaccine to protect against certain types of pneumonia.  This is normally recommended for adults age 54 or older.  However, adults younger than 55 years old with certain underlying conditions such as diabetes, heart or lung disease should also receive the vaccine.  Shingles vaccine to protect against Varicella Zoster if you are older than age 61, or younger than 55 years old with certain underlying illness.  Hepatitis A vaccine to protect against a form of infection of the liver by a virus acquired from food.  Hepatitis B vaccine to protect against a form of infection of the liver by a virus acquired from blood or body fluids, particularly if you work in health care.  If you plan to travel internationally, check with your local health department for specific vaccination recommendations.  Cancer Screening:  Breast cancer screening is essential to preventive care for women. All women age 53 and older should perform a breast self-exam every month. At age 78 and older, women should have their caregiver complete a breast exam each year. Women at ages 46 and older should have a mammogram (x-ray film) of the breasts. Your caregiver can discuss how often you need mammograms.    Cervical cancer screening includes taking a Pap smear (sample of cells examined under a microscope) from the cervix (end of the uterus). It also includes testing for HPV (Human Papilloma Virus, which can cause cervical cancer). Screening and a pelvic exam should begin at age 18, or 3 years after a woman becomes sexually active. Screening should occur every year, with a Pap smear but no HPV testing, up to age 74. After age 49, you should have a Pap smear every 3 years with HPV testing, if no HPV was found previously.   Most routine colon cancer screening begins at the age of 14. On a yearly basis, doctors may provide  special easy to use take-home tests to check for hidden blood in the stool. Sigmoidoscopy or colonoscopy can detect the earliest forms of colon cancer and is life saving. These tests use a small camera at the end of a tube to directly examine the colon. Speak to your caregiver about this at age 44, when routine screening begins (and is repeated every 5 years unless early forms of pre-cancerous polyps or small growths are found).    Kegel Exercises The goal of Kegel exercises is to isolate and exercise your pelvic floor muscles. These muscles act as a hammock  that supports the rectum, vagina, small intestine, and uterus. As the muscles weaken, the hammock sags and these organs are displaced from their normal positions. Kegel exercises can strengthen your pelvic floor muscles and help you to improve bladder and bowel control, improve sexual response, and help reduce many problems and some discomfort during pregnancy. Kegel exercises can be done anywhere and at any time. HOW TO PERFORM KEGEL EXERCISES 1. Locate your pelvic floor muscles. To do this, squeeze (contract) the muscles that you use when you try to stop the flow of urine. You will feel a tightness in the vaginal area (women) and a tight lift in the rectal area (men and women). 2. When you begin, contract your pelvic muscles tight for 2-5 seconds, then relax them for 2-5 seconds. This is one set. Do 4-5 sets with a short pause in between. 3. Contract your pelvic muscles for 8-10 seconds, then relax them for 8-10 seconds. Do 4-5 sets. If you cannot contract your pelvic muscles for 8-10 seconds, try 5-7 seconds and work your way up to 8-10 seconds. Your goal is 4-5 sets of 10 contractions each day. Keep your stomach, buttocks, and legs relaxed during the exercises. Perform sets of both short and long contractions. Vary your positions. Perform these contractions 3-4 times per day. Perform sets while you are:   Lying in bed in the morning.  Standing  at lunch.  Sitting in the late afternoon.  Lying in bed at night. You should do 40-50 contractions per day. Do not perform more Kegel exercises per day than recommended. Overexercising can cause muscle fatigue. Continue these exercises for for at least 15-20 weeks or as directed by your caregiver. Document Released: 10/22/2012 Document Reviewed: 10/22/2012 [facility] Patient Information 2015 [place]. This information is not intended to replace advice given to you by your health care provider. Make sure you discuss any questions you have with your health care provider.

## 2014-07-13 NOTE — Progress Notes (Signed)
Complete Physical  Assessment and Plan: Allergy-take meds PRN  Hypothyroidism-check TSH level, continue medications the same.   Hypertension-- continue medications, DASH diet, exercise and monitor at home. Call if greater than 130/80.   Hyperlipidemia--continue medications, check lipids, decrease fatty foods, increase activity.   Insomnia-good hygiene discussed  Vitamin D deficiency-continue supplement  Nephrolithiasis- controlled  GERD (gastroesophageal reflux disease)- diet controlled, meds as needed  Fatty liver disease, nonalcoholic- continue weight loss  Prediabetes-Discussed general issues about diabetes pathophysiology and management., Educational material distributed., Suggested low cholesterol diet., Encouraged aerobic exercise., Discussed foot care., Reminded to get yearly retinal exam.  Obesity with co morbidities- long discussion about weight loss, diet, and exercise, continue phentermine PAP sent off Wet prep done with PAP due to discharge FBD- get 3D MGM  Discussed med's effects and SE's. Screening labs and tests as requested with regular follow-up as recommended.  HPI 53 y.o. female  presents for a complete physical. She has had elevated blood pressure since 2000. Her blood pressure has been controlled at home, today their BP is BP: 122/72 mmHg She does workout, walks 5 miles a day. She denies chest pain, shortness of breath, dizziness.  She is on cholesterol medication and denies myalgias. Her cholesterol is at goal. The cholesterol last visit was:   Lab Results  Component Value Date   CHOL 191 02/01/2014   HDL 45 02/01/2014   LDLCALC 116* 02/01/2014   TRIG 149 02/01/2014   CHOLHDL 4.2 02/01/2014    She has been working on diet and exercise for prediabetes, and denies paresthesia of the feet, polydipsia and polyuria. Last A1C in the office was:  Lab Results  Component Value Date   HGBA1C 6.1* 02/01/2014   Patient is on Vitamin D supplement.   Lab Results  Component  Value Date   VD25OH 48* 02/01/2014     She is on thyroid medication. Her medication was not changed last visit. Patient denies nervousness and palpitations.  Lab Results  Component Value Date   TSH 3.039 02/01/2014  .  Her BMI is Body mass index is 31.57 kg/(m^2)., she is working on diet and exercise and has done well.  Wt Readings from Last 3 Encounters:  07/13/14 184 lb (83.462 kg)  06/24/14 185 lb (83.915 kg)  03/01/14 208 lb (94.348 kg)   Has a history of kidney stones but denies any pain at this time.  Very rare trouble sleeping, takes xanax PRN if this happens.   Current Medications:    Medication List       This list is accurate as of: 07/13/14  9:14 AM.  Always use your most recent med list.               ALPRAZolam 0.5 MG tablet  Commonly known as:  XANAX  Take 0.5 mg by mouth at bedtime as needed for anxiety.     cholecalciferol 1000 UNITS tablet  Commonly known as:  VITAMIN D  Take 10,000 Units by mouth daily.     diltiazem 240 MG 24 hr capsule  Commonly known as:  DILACOR XR  Take 240 mg by mouth daily.     hydrochlorothiazide 25 MG tablet  Commonly known as:  HYDRODIURIL  Take 25 mg by mouth daily.     ibuprofen 600 MG tablet  Commonly known as:  ADVIL,MOTRIN  Take 1 tablet (600 mg total) by mouth every 8 (eight) hours as needed for pain.     levothyroxine 100 MCG tablet  Commonly known  as:  SYNTHROID, LEVOTHROID  Take 1 tablet (100 mcg total) by mouth daily before breakfast.     phentermine 37.5 MG tablet  Commonly known as:  ADIPEX-P  Take 1 tablet (37.5 mg total) by mouth daily before breakfast.     quinapril 40 MG tablet  Commonly known as:  ACCUPRIL  Take 40 mg by mouth daily.        Health Maintenance:   Immunization History  Administered Date(s) Administered  . Pneumococcal-Unspecified 04/10/2010  . Td 11/03/2008   Tetanus: 2009 Pneumovax: 2011 Flu vaccine: declines Zostavax: INO:[id number] DUE MGM: 02/2014 suggest  3D DEXA: Colonoscopy:2011 due 2016 Dr. Deatra Ina EGD:  Allergies:  Allergies  Allergen Reactions  . Atenolol     Beta blockers  . Codeine Nausea Only  . Klonopin [Clonazepam]   . Penicillins Rash   Medical History:  Past Medical History  Diagnosis Date  . Allergy   . Thyroid disease   . Hypertension   . Hyperlipidemia   . Insomnia   . Vitamin D deficiency   . Nephrolithiasis   . GERD (gastroesophageal reflux disease)   . Fatty liver disease, nonalcoholic 20/9470    [facility]ia [place]  . Prediabetes    Surgical History:  Past Surgical History  Procedure Laterality Date  . Cholecystectomy    . Tubal ligation     Family History:  Family History  Problem Relation Age of Onset  . Heart Problems Mother   . Hypertension Mother    Social History:  History  Substance Use Topics  . Smoking status: Former Smoker -- 1.00 packs/day for 10 years    Quit date: 11/03/1993  . Smokeless tobacco: Not on file     Comment: quit 15years ago  . Alcohol Use: Yes     Comment: occasionally    Review of Systems: [X]  = complains of  [ ]  = denies  General: Fatigue [ ]  Fever [ ]  Chills [ ]  Weakness [ ]   Insomnia [ ] Weight change [ ]  Night sweats [ ]   Change in appetite [ ]  Eyes: Redness [ ]  Blurred vision [ ]  Diplopia [ ]  Discharge [ ]   ENT: Congestion [ ]  Sinus Pain [ ]  Post Nasal Drip [ ]  Sore Throat [ ]  Earache [ ]  hearing loss [ ]  Tinnitus [ ]  Snoring [ ]   Cardiac: Chest pain/pressure [ ]  SOB [ ]  Orthopnea [ ]   Palpitations [ ]   Paroxysmal nocturnal dyspnea[ ]  Claudication [ ]  Edema [ ]   Pulmonary: Cough [ ]  Wheezing[ ]   SOB [ ]   Pleurisy [ ]   GI: Nausea [ ]  Vomiting[ ]  Dysphagia[ ]  Heartburn[ ]  Abdominal pain [ ]  Constipation [ ] ; Diarrhea [ ]  BRBPR [ ]  Melena[ ]  Bloating [ ]  Hemorrhoids [ ]   GU: Hematuria[ ]  Dysuria [ ]  Nocturia[ ]  Urgency [ ]   Hesitancy [ ]  Discharge [ ]  Frequency [ ]   Breast:  Breast lumps [ ]   nipple discharge [ ]    Neuro: Headaches[ ]  Vertigo[ ]  Paresthesias[ ]  Spasm [ ]   Speech changes [ ]  Incoordination [ ]   Ortho: Arthritis [ ]  Joint pain [ ]  Muscle pain [ ]  Joint swelling [ ]  Back Pain [ ]  Skin:  Rash [ ]   Pruritis [ ]  Change in skin lesion [ ]   Psych: Depression[ ]  Anxiety[ ]  Confusion [ ]  Memory loss [ ]   Heme/Lypmh: Bleeding [ ]  Bruising [ ]  Enlarged lymph nodes [ ]   Endocrine: Visual blurring [ ]  Paresthesia [ ]  Polyuria [ ]   Polydypsea [ ]    Heat/cold intolerance [ ]  Hypoglycemia [ ]   Physical Exam: Estimated body mass index is 31.57 kg/(m^2) as calculated from the following:   Height as of this encounter: 5\' 4"  (1.626 m).   Weight as of this encounter: 184 lb (83.462 kg). BP 122/72  Pulse 68  Temp(Src) 97.9 F (36.6 C)  Resp 16  Ht 5\' 4"  (1.626 m)  Wt 184 lb (83.462 kg)  BMI 31.57 kg/m2 General Appearance: Well nourished, in no apparent distress. Eyes: PERRLA, EOMs, conjunctiva no swelling or erythema, normal fundi and vessels. Sinuses: No Frontal/maxillary tenderness ENT/Mouth: Ext aud canals clear, normal light reflex with TMs without erythema, bulging.  Good dentition. No erythema, swelling, or exudate on post pharynx. Tonsils not swollen or erythematous. Hearing normal.  Neck: Supple, thyroid normal. No bruits Respiratory: Respiratory effort normal, BS equal bilaterally without rales, rhonchi, wheezing or stridor. Cardio: RRR without murmurs, rubs or gallops. Brisk peripheral pulses without edema.  Chest: symmetric, with normal excursions and percussion. Breasts: Symmetric, without lumps, nipple discharge, retractions. Abdomen: Soft, +BS. Non tender, no guarding, rebound, hernias, masses, or organomegaly. .  Lymphatics: Non tender without lymphadenopathy.  Genitourinary: VAGINA: atrophic, vaginal erythema , vaginal discharge - white, creamy and odorless, CERVIX: cervical motion tenderness absent, PAP: Pap smear done today and wet prep. Musculoskeletal: Full ROM all peripheral extremities,5/5 strength, and normal gait. Skin: Warm, dry  without rashes, lesions, ecchymosis.  Neuro: Cranial nerves intact, reflexes equal bilaterally. Normal muscle tone, no cerebellar symptoms. Sensation intact.  Psych: Awake and oriented X 3, normal affect, Insight and Judgment appropriate.   EKG: WNL no changes. AORTA SCAN: WNL    Vicie Mutters 9:07 AM

## 2014-07-14 LAB — INSULIN, FASTING: Insulin fasting, serum: 6.3 u[IU]/mL (ref 2.0–19.6)

## 2014-07-14 LAB — VITAMIN D 25 HYDROXY (VIT D DEFICIENCY, FRACTURES): Vit D, 25-Hydroxy: 97 ng/mL — ABNORMAL HIGH (ref 30–89)

## 2014-07-15 LAB — CYTOLOGY - PAP

## 2014-07-15 MED ORDER — FLUCONAZOLE 150 MG PO TABS: 150.0000 mg | ORAL_TABLET | Freq: Every day | ORAL | Status: DC

## 2014-07-15 NOTE — Addendum Note (Signed)
Addended by: Vicie Mutters R on: 07/15/2014 05:09 PM   Modules accepted: Orders

## 2014-07-20 ENCOUNTER — Encounter: Payer: Self-pay | Admitting: Physician Assistant

## 2014-07-20 ENCOUNTER — Other Ambulatory Visit: Payer: Self-pay | Admitting: Physician Assistant

## 2014-07-20 MED ORDER — FLUCONAZOLE 150 MG PO TABS: 150.0000 mg | ORAL_TABLET | Freq: Every day | ORAL | Status: DC

## 2014-08-04 ENCOUNTER — Encounter: Payer: Self-pay | Admitting: Physician Assistant

## 2014-11-05 ENCOUNTER — Other Ambulatory Visit: Payer: Self-pay | Admitting: Physician Assistant

## 2014-11-08 ENCOUNTER — Other Ambulatory Visit: Payer: Self-pay | Admitting: *Deleted

## 2014-11-08 MED ORDER — QUINAPRIL HCL 40 MG PO TABS: 40.0000 mg | ORAL_TABLET | Freq: Every day | ORAL | Status: DC

## 2014-11-08 MED ORDER — HYDROCHLOROTHIAZIDE 25 MG PO TABS
25.0000 mg | ORAL_TABLET | Freq: Every day | ORAL | Status: DC
Start: 2014-11-08 — End: 2015-06-29

## 2014-11-08 MED ORDER — DILTIAZEM HCL ER 240 MG PO CP24: 240.0000 mg | ORAL_CAPSULE | Freq: Every day | ORAL | Status: DC

## 2014-11-18 ENCOUNTER — Ambulatory Visit: Payer: Self-pay | Admitting: Physician Assistant

## 2014-11-19 HISTORY — PX: LITHOTRIPSY: SUR834

## 2014-11-20 ENCOUNTER — Telehealth: Payer: 59 | Admitting: Family

## 2014-11-20 DIAGNOSIS — J069 Acute upper respiratory infection, unspecified: Secondary | ICD-10-CM

## 2014-11-20 MED ORDER — AZITHROMYCIN 250 MG PO TABS: ORAL_TABLET | ORAL | Status: DC

## 2014-11-20 MED ORDER — GUAIFENESIN-DM 100-10 MG/5ML PO SYRP: 5.0000 mL | ORAL_SOLUTION | ORAL | Status: DC | PRN

## 2014-11-20 NOTE — Progress Notes (Signed)
We are sorry that you are not feeling well.  Here is how we plan to help!  Based on what you have shared with me it looks like you have upper respiratory tract inflammation that has resulted in a signification cough.  Inflammation and infection in the upper respiratory tract is commonly called bronchitis and has four common causes:  Allergies, Viral Infections, Acid Reflux and Bacterial Infections.  Allergies, viruses and acid reflux are treated by controlling symptoms or eliminating the cause. An example might be a cough caused by taking certain blood pressure medications. You stop the cough by changing the medication. Another example might be a cough caused by acid reflux. Controlling the reflux helps control the cough.  Based on your presentation I believe you most likely have A cough due to bacteria.  When patients have a fever and a productive cough with a change in color or increased sputum production, we are concerned about bacterial bronchitis.  If left untreated it can progress to pneumonia.  If your symptoms do not improve with your treatment plan it is important that you contact your provider.   I have prescribed Azithromyin 250 mg: two tables now and then one tablet daily for 4 additonal days I have also prescribed Guaifenesin-dextromethorphan 100-10mg /47ml cough syrup. You may take 41ml every 4 hours as needed for cough.        HOME CARE . Only take medications as instructed by your medical team. . Complete the entire course of an antibiotic. . Drink plenty of fluids and get plenty of rest. . Avoid close contacts especially the very young and the elderly . Cover your mouth if you cough or cough into your sleeve. . Always remember to wash your hands . A steam or ultrasonic humidifier can help congestion.    GET HELP RIGHT AWAY IF: . You develop worsening fever. . You become short of breath . You cough up blood. . Your symptoms persist after you have completed your treatment plan MAKE  SURE YOU   Understand these instructions.  Will watch your condition.  Will get help right away if you are not doing well or get worse.  Your e-visit answers were reviewed by a board certified advanced clinical practitioner to complete your personal care plan.  Depending on the condition, your plan could have included both over the counter or prescription medications.  If there is a problem please reply  once you have received a response from your provider.  Your safety is important to [place].  If you have drug allergies check your prescription carefully.    You can use MyChart to ask questions about today's visit, request a non-urgent call back, or ask for a work or school excuse.  You will get an e-mail in the next two days asking about your experience.  I hope that your e-visit has been valuable and will speed your recovery. Thank you for using e-visits.

## 2014-11-23 ENCOUNTER — Encounter: Payer: Self-pay | Admitting: Physician Assistant

## 2014-11-23 ENCOUNTER — Ambulatory Visit (INDEPENDENT_AMBULATORY_CARE_PROVIDER_SITE_OTHER): Payer: 59 | Admitting: Physician Assistant

## 2014-11-23 VITALS — BP 120/78 | HR 72 | Temp 97.7°F | Resp 16 | Ht 64.0 in | Wt 178.0 lb

## 2014-11-23 DIAGNOSIS — E559 Vitamin D deficiency, unspecified: Secondary | ICD-10-CM

## 2014-11-23 DIAGNOSIS — R7303 Prediabetes: Secondary | ICD-10-CM

## 2014-11-23 DIAGNOSIS — J0100 Acute maxillary sinusitis, unspecified: Secondary | ICD-10-CM

## 2014-11-23 DIAGNOSIS — I10 Essential (primary) hypertension: Secondary | ICD-10-CM

## 2014-11-23 DIAGNOSIS — Z79899 Other long term (current) drug therapy: Secondary | ICD-10-CM

## 2014-11-23 DIAGNOSIS — E669 Obesity, unspecified: Secondary | ICD-10-CM

## 2014-11-23 DIAGNOSIS — R7309 Other abnormal glucose: Secondary | ICD-10-CM

## 2014-11-23 DIAGNOSIS — E079 Disorder of thyroid, unspecified: Secondary | ICD-10-CM

## 2014-11-23 DIAGNOSIS — E785 Hyperlipidemia, unspecified: Secondary | ICD-10-CM

## 2014-11-23 MED ORDER — PHENTERMINE HCL 37.5 MG PO TABS: 37.5000 mg | ORAL_TABLET | Freq: Every day | ORAL | Status: DC

## 2014-11-23 MED ORDER — PREDNISONE 20 MG PO TABS: ORAL_TABLET | ORAL | Status: DC

## 2014-11-23 MED ORDER — PROMETHAZINE-CODEINE 6.25-10 MG/5ML PO SYRP: 5.0000 mL | ORAL_SOLUTION | Freq: Four times a day (QID) | ORAL | Status: DC | PRN

## 2014-11-23 NOTE — Progress Notes (Signed)
Assessment and Plan:  Hypertension: Continue medication, monitor blood pressure at home. Continue DASH diet.  Reminder to go to the ER if any CP, SOB, nausea, dizziness, severe HA, changes vision/speech, left arm numbness and tingling, and jaw pain. Cholesterol: Continue diet and exercise. Check cholesterol.  Pre-diabetes-Continue diet and exercise. Check A1C Vitamin D Def- check level and continue medications.  Obesity with co morbidities- long discussion about weight loss, diet, and exercise, will start the patient on phentermine- hand out given and AE's discussed, will do close follow up. No more after this since it has been a year, only on 1/2 a day URI- Discussed the diagnosis and treatment of sinusitis. Discussed the importance of avoiding unnecessary antibiotic therapy. Suggested symptomatic OTC remedies. Nasal saline spray for congestion. Nasal steroids per orders. Follow up as needed. Call in 5 days if symptoms aren't resolving. finish zpak, if CBC worse or if not better will send in levaquin. Codeine cough syrup sent in for at night, prednisone.    Continue diet and meds as discussed. Further disposition pending results of labs.  HPI 54 y.o. female  presents for 3 month follow up with hypertension, hyperlipidemia, prediabetes and vitamin D. Her blood pressure has been controlled at home, today their BP is BP: 120/78 mmHg She does workout, walks daily. She denies chest pain, shortness of breath, dizziness.  She is on cholesterol medication and denies myalgias. Her cholesterol is not at goal. The cholesterol last visit was:   Lab Results  Component Value Date   CHOL 161 07/13/2014   HDL 45 07/13/2014   LDLCALC 90 07/13/2014   TRIG 130 07/13/2014   CHOLHDL 3.6 07/13/2014   She has been working on diet and exercise for prediabetes, and denies paresthesia of the feet, polydipsia, polyuria and visual disturbances. Last A1C in the office was:  Lab Results  Component Value Date   HGBA1C 5.9* 07/13/2014   Patient is on Vitamin D supplement.   Lab Results  Component Value Date   VD25OH 97* 07/13/2014   Sister and mother passed in November, she is very tearful, her sister had stroke and sudden death due to heart valve issues, mother got pneumonia.  She has had sinusitis since Dec 31, got a zpak over the weekend. She states she can not sleep at night due to cough.  BMI is Body mass index is 30.54 kg/(m^2)., she is working on diet and exercise, she is on phentermine, has been on since march.  Wt Readings from Last 3 Encounters:  11/23/14 178 lb (80.74 kg)  07/13/14 184 lb (83.462 kg)  06/24/14 185 lb (83.915 kg)  She is on thyroid medication. Her medication was not changed last visit.  Lab Results  Component Value Date   TSH 0.653 07/13/2014  .   Current Medications:  Current Outpatient Prescriptions on File Prior to Visit  Medication Sig Dispense Refill  . ALPRAZolam (XANAX) 0.5 MG tablet Take 0.5 mg by mouth at bedtime as needed for anxiety.    Marland Kitchen azithromycin (ZITHROMAX) 250 MG tablet Take 2 tablets now, then 1 tablet daily x 4 days. 6 tablet 0  . cholecalciferol (VITAMIN D) 1000 UNITS tablet Take 10,000 Units by mouth daily.     Marland Kitchen diltiazem (DILACOR XR) 240 MG 24 hr capsule Take 1 capsule (240 mg total) by mouth daily. 90 capsule 2  . fluconazole (DIFLUCAN) 150 MG tablet Take 1 tablet (150 mg total) by mouth daily. 1 tablet 3  . guaiFENesin-dextromethorphan (ROBITUSSIN DM) 100-10 MG/5ML  syrup Take 5 mLs by mouth every 4 (four) hours as needed for cough. 118 mL 0  . hydrochlorothiazide (HYDRODIURIL) 25 MG tablet Take 1 tablet (25 mg total) by mouth daily. 90 tablet 2  . ibuprofen (ADVIL,MOTRIN) 600 MG tablet Take 1 tablet (600 mg total) by mouth every 8 (eight) hours as needed for pain. 21 tablet 0  . levothyroxine (SYNTHROID, LEVOTHROID) 100 MCG tablet Take 1 tablet (100 mcg total) by mouth daily before breakfast. 30 tablet 1  . levothyroxine (SYNTHROID,  LEVOTHROID) 100 MCG tablet Take 1 tablet by mouth  daily before breakfast 90 tablet 1  . phentermine (ADIPEX-P) 37.5 MG tablet Take 1 tablet (37.5 mg total) by mouth daily before breakfast. 30 tablet 1  . quinapril (ACCUPRIL) 40 MG tablet Take 1 tablet (40 mg total) by mouth daily. 90 tablet 2   No current facility-administered medications on file prior to visit.   Medical History:  Past Medical History  Diagnosis Date  . Allergy   . Thyroid disease   . Hypertension   . Hyperlipidemia   . Insomnia   . Vitamin D deficiency   . Nephrolithiasis   . GERD (gastroesophageal reflux disease)   . Fatty liver disease, nonalcoholic 88/5027    [facility]/S  . Prediabetes    Allergies:  Allergies  Allergen Reactions  . Atenolol     Beta blockers  . Codeine Nausea Only  . Klonopin [Clonazepam]   . Penicillins Rash    Review of Systems:  Review of Systems  Constitutional: Positive for malaise/fatigue. Negative for fever, chills, weight loss and diaphoresis.  HENT: Positive for congestion and sore throat. Negative for ear discharge, ear pain, hearing loss, nosebleeds and tinnitus.   Eyes: Negative.   Respiratory: Positive for cough. Negative for hemoptysis, sputum production, shortness of breath, wheezing and stridor.   Cardiovascular: Negative.   Gastrointestinal: Negative.   Genitourinary: Negative.   Musculoskeletal: Negative.   Skin: Negative.   Neurological: Positive for headaches. Negative for dizziness, tingling, tremors, sensory change, speech change, focal weakness, seizures, loss of consciousness and weakness.  Psychiatric/Behavioral: Positive for depression. Negative for suicidal ideas, hallucinations, memory loss and substance abuse. The patient is not nervous/anxious and does not have insomnia.     Family history- Review and unchanged Social history- Review and unchanged Physical Exam: BP 120/78 mmHg  Pulse 72  Temp(Src) 97.7 F (36.5 C)  Resp 16  Ht 5\' 4"  (1.626 m)  Wt  178 lb (80.74 kg)  BMI 30.54 kg/m2 Wt Readings from Last 3 Encounters:  11/23/14 178 lb (80.74 kg)  07/13/14 184 lb (83.462 kg)  06/24/14 185 lb (83.915 kg)   General Appearance: Well nourished, in no apparent distress. Eyes: PERRLA, EOMs, conjunctiva no swelling or erythema Sinuses: + Frontal/maxillary tenderness ENT/Mouth: Ext aud canals clear, TMs without erythema, bulging. No erythema, swelling, or exudate on post pharynx.  Tonsils not swollen or erythematous. Hearing normal.  Neck: Supple, thyroid normal.  Respiratory: Respiratory effort normal, BS equal bilaterally without rales, rhonchi, wheezing or stridor.  Cardio: RRR with no MRGs. Brisk peripheral pulses without edema.  Abdomen: Soft, + BS.  Non tender, no guarding, rebound, hernias, masses. Lymphatics: Non tender without lymphadenopathy.  Musculoskeletal: Full ROM, 5/5 strength, normal gait.  Skin: Warm, dry without rashes, lesions, ecchymosis.  Neuro: Cranial nerves intact. Normal muscle tone, no cerebellar symptoms. Sensation intact.  Psych: Awake and oriented X 3, normal affect, Insight and Judgment appropriate.    Vicie Mutters, PA-C 4:13 PM  [facility]

## 2014-11-23 NOTE — Patient Instructions (Signed)
Take 2 prednisone the first night before bed, then 1 a day for 2-4 days with food.   -Please take Tylenol or Ibuprofen for pain. -Acetaminiphen 325mg  orally every 4-6 hours for pain.  Max: 10 per day -Ibuprofen 200mg  orally every 6-8 hours for pain.  Take with food to avoid ulcers.   Max 10 per day  Please pick one of the over the counter allergy medications below and take it once daily for allergies.  Claritin or loratadine cheapest but likely the weakest  Zyrtec or certizine at night because it can make you sleepy The strongest is allegra or fexafinadine  Cheapest at [employer], [employer], [employer]  -While drinking fluids, pinch and hold nose close and swallow.  This will help open up your eustachian tubes to drain the fluid behind your ear drums. -Try steam showers to open your nasal passages.   Drink lots of water to stay hydrated and to thin mucous.  Flonase/Nasonex is to help the inflammation.  Take 2 sprays in each nostril at bedtime.  Make sure you spray towards the outside of each nostril towards the outer corner of your eye, hold nose close and tilt head back.  This will help the medication get into your sinuses.  If you do not like this medication, then use saline nasal sprays same directions as above for Flonase. Stop the medication right away if you get blurring of your vision or nose bleeds.  -It can take up to 2 weeks to feel better.  Sinusitis is mostly caused by viruses.  -If you do not get better in 7-10 days (Have fever, facial pain, dental pain and swelling), then please call the office and I can send in an antibiotic.  Sinusitis Sinusitis is redness, soreness, and inflammation of the paranasal sinuses. Paranasal sinuses are air pockets within the bones of your face (beneath the eyes, the middle of the forehead, or above the eyes). In healthy paranasal sinuses, mucus is able to drain out, and air is able to circulate through them by way of your nose. However, when your paranasal  sinuses are inflamed, mucus and air can become trapped. This can allow bacteria and other germs to grow and cause infection. Sinusitis can develop quickly and last only a short time (acute) or continue over a long period (chronic). Sinusitis that lasts for more than 12 weeks is considered chronic.  CAUSES  Causes of sinusitis include:  Allergies.  Structural abnormalities, such as displacement of the cartilage that separates your nostrils (deviated septum), which can decrease the air flow through your nose and sinuses and affect sinus drainage.  Functional abnormalities, such as when the small hairs (cilia) that line your sinuses and help remove mucus do not work properly or are not present. SIGNS AND SYMPTOMS  Symptoms of acute and chronic sinusitis are the same. The primary symptoms are pain and pressure around the affected sinuses. Other symptoms include:  Upper toothache.  Earache.  Headache.  Bad breath.  Decreased sense of smell and taste.  A cough, which worsens when you are lying flat.  Fatigue.  Fever.  Thick drainage from your nose, which often is green and may contain pus (purulent).  Swelling and warmth over the affected sinuses. DIAGNOSIS  Your health care provider will perform a physical exam. During the exam, your health care provider may:  Look in your nose for signs of abnormal growths in your nostrils (nasal polyps).  Tap over the affected sinus to check for signs of infection.  View the inside of your sinuses (endoscopy) using an imaging device that has a light attached (endoscope). If your health care provider suspects that you have chronic sinusitis, one or more of the following tests may be recommended:  Allergy tests.  Nasal culture. A sample of mucus is taken from your nose, sent to a lab, and screened for bacteria.  Nasal cytology. A sample of mucus is taken from your nose and examined by your health care provider to determine if your sinusitis  is related to an allergy. TREATMENT  Most cases of acute sinusitis are related to a viral infection and will resolve on their own within 10 days. Sometimes medicines are prescribed to help relieve symptoms (pain medicine, decongestants, nasal steroid sprays, or saline sprays).  However, for sinusitis related to a bacterial infection, your health care provider will prescribe antibiotic medicines. These are medicines that will help kill the bacteria causing the infection.  Rarely, sinusitis is caused by a fungal infection. In theses cases, your health care provider will prescribe antifungal medicine. For some cases of chronic sinusitis, surgery is needed. Generally, these are cases in which sinusitis recurs more than 3 times per year, despite other treatments. HOME CARE INSTRUCTIONS   Drink plenty of water. Water helps thin the mucus so your sinuses can drain more easily.  Use a humidifier.  Inhale steam 3 to 4 times a day (for example, sit in the bathroom with the shower running).  Apply a warm, moist washcloth to your face 3 to 4 times a day, or as directed by your health care provider.  Use saline nasal sprays to help moisten and clean your sinuses.  Take medicines only as directed by your health care provider.  If you were prescribed either an antibiotic or antifungal medicine, finish it all even if you start to feel better. SEEK IMMEDIATE MEDICAL CARE IF:  You have increasing pain or severe headaches.  You have nausea, vomiting, or drowsiness.  You have swelling around your face.  You have vision problems.  You have a stiff neck.  You have difficulty breathing. MAKE SURE YOU:   Understand these instructions.  Will watch your condition.  Will get help right away if you are not doing well or get worse. Document Released: 11/05/2005 Document Revised: 03/22/2014 Document Reviewed: 11/20/2011 [facility] Patient Information 2015 [place], [place]. This information is not intended  to replace advice given to you by your health care provider. Make sure you discuss any questions you have with your health care provider.

## 2014-11-24 ENCOUNTER — Encounter: Payer: Self-pay | Admitting: Physician Assistant

## 2014-11-24 LAB — CBC WITH DIFFERENTIAL/PLATELET
BASOS ABS: 0.1 10*3/uL (ref 0.0–0.1)
Basophils Relative: 1 % (ref 0–1)
Eosinophils Absolute: 0.4 10*3/uL (ref 0.0–0.7)
Eosinophils Relative: 5 % (ref 0–5)
HEMATOCRIT: 45.2 % (ref 36.0–46.0)
HEMOGLOBIN: 15.5 g/dL — AB (ref 12.0–15.0)
Lymphocytes Relative: 33 % (ref 12–46)
Lymphs Abs: 2.9 10*3/uL (ref 0.7–4.0)
MCH: 30.7 pg (ref 26.0–34.0)
MCHC: 34.3 g/dL (ref 30.0–36.0)
MCV: 89.5 fL (ref 78.0–100.0)
MONOS PCT: 13 % — AB (ref 3–12)
MPV: 10.5 fL (ref 8.6–12.4)
Monocytes Absolute: 1.1 10*3/uL — ABNORMAL HIGH (ref 0.1–1.0)
Neutro Abs: 4.2 10*3/uL (ref 1.7–7.7)
Neutrophils Relative %: 48 % (ref 43–77)
PLATELETS: 311 10*3/uL (ref 150–400)
RBC: 5.05 MIL/uL (ref 3.87–5.11)
RDW: 12.8 % (ref 11.5–15.5)
WBC: 8.7 10*3/uL (ref 4.0–10.5)

## 2014-11-24 LAB — BASIC METABOLIC PANEL WITH GFR
BUN: 12 mg/dL (ref 6–23)
CHLORIDE: 101 meq/L (ref 96–112)
CO2: 31 meq/L (ref 19–32)
CREATININE: 0.78 mg/dL (ref 0.50–1.10)
Calcium: 9.8 mg/dL (ref 8.4–10.5)
GFR, EST NON AFRICAN AMERICAN: 86 mL/min
Glucose, Bld: 94 mg/dL (ref 70–99)
POTASSIUM: 4.2 meq/L (ref 3.5–5.3)
SODIUM: 140 meq/L (ref 135–145)

## 2014-11-24 LAB — TSH: TSH: 12.12 u[IU]/mL — ABNORMAL HIGH (ref 0.350–4.500)

## 2014-11-24 LAB — HEMOGLOBIN A1C
Hgb A1c MFr Bld: 5.7 % — ABNORMAL HIGH (ref <5.7)
Mean Plasma Glucose: 117 mg/dL — ABNORMAL HIGH (ref <117)

## 2014-11-24 LAB — LIPID PANEL
CHOL/HDL RATIO: 4.1 ratio
Cholesterol: 184 mg/dL (ref 0–200)
HDL: 45 mg/dL (ref >39)
LDL Cholesterol: 98 mg/dL (ref 0–99)
Triglycerides: 205 mg/dL — ABNORMAL HIGH (ref <150)
VLDL: 41 mg/dL — AB (ref 0–40)

## 2014-11-24 LAB — HEPATIC FUNCTION PANEL
ALT: 19 U/L (ref 0–35)
AST: 22 U/L (ref 0–37)
Albumin: 4.2 g/dL (ref 3.5–5.2)
Alkaline Phosphatase: 93 U/L (ref 39–117)
BILIRUBIN DIRECT: 0.1 mg/dL (ref 0.0–0.3)
BILIRUBIN TOTAL: 0.4 mg/dL (ref 0.2–1.2)
Indirect Bilirubin: 0.3 mg/dL (ref 0.2–1.2)
Total Protein: 7 g/dL (ref 6.0–8.3)

## 2014-11-24 LAB — VITAMIN D 25 HYDROXY (VIT D DEFICIENCY, FRACTURES): Vit D, 25-Hydroxy: 77 ng/mL (ref 30–100)

## 2014-11-24 LAB — MAGNESIUM: Magnesium: 2.1 mg/dL (ref 1.5–2.5)

## 2014-11-24 LAB — INSULIN, FASTING: Insulin fasting, serum: 20.2 u[IU]/mL — ABNORMAL HIGH (ref 2.0–19.6)

## 2014-12-15 ENCOUNTER — Ambulatory Visit (INDEPENDENT_AMBULATORY_CARE_PROVIDER_SITE_OTHER): Payer: 59 | Admitting: Physician Assistant

## 2014-12-15 ENCOUNTER — Encounter: Payer: Self-pay | Admitting: Physician Assistant

## 2014-12-15 VITALS — BP 120/64 | HR 82 | Temp 98.0°F | Resp 16 | Ht 64.0 in | Wt 187.0 lb

## 2014-12-15 DIAGNOSIS — Z79899 Other long term (current) drug therapy: Secondary | ICD-10-CM

## 2014-12-15 DIAGNOSIS — R319 Hematuria, unspecified: Secondary | ICD-10-CM

## 2014-12-15 LAB — BASIC METABOLIC PANEL WITH GFR
BUN: 13 mg/dL (ref 6–23)
CO2: 28 mEq/L (ref 19–32)
CREATININE: 0.7 mg/dL (ref 0.50–1.10)
Calcium: 9.4 mg/dL (ref 8.4–10.5)
Chloride: 101 mEq/L (ref 96–112)
GFR, Est African American: >89 mL/min
GFR, Est Non African American: >89 mL/min
GLUCOSE: 92 mg/dL (ref 70–99)
POTASSIUM: 4.2 meq/L (ref 3.5–5.3)
Sodium: 137 mEq/L (ref 135–145)

## 2014-12-15 LAB — CBC WITH DIFFERENTIAL/PLATELET
BASOS ABS: 0.1 10*3/uL (ref 0.0–0.1)
Basophils Relative: 1 % (ref 0–1)
EOS ABS: 0.3 10*3/uL (ref 0.0–0.7)
Eosinophils Relative: 4 % (ref 0–5)
HEMATOCRIT: 40.4 % (ref 36.0–46.0)
HEMOGLOBIN: 13.9 g/dL (ref 12.0–15.0)
LYMPHS ABS: 2.3 10*3/uL (ref 0.7–4.0)
LYMPHS PCT: 30 % (ref 12–46)
MCH: 30.5 pg (ref 26.0–34.0)
MCHC: 34.4 g/dL (ref 30.0–36.0)
MCV: 88.8 fL (ref 78.0–100.0)
MONOS PCT: 8 % (ref 3–12)
MPV: 9.7 fL (ref 8.6–12.4)
Monocytes Absolute: 0.6 10*3/uL (ref 0.1–1.0)
Neutro Abs: 4.4 10*3/uL (ref 1.7–7.7)
Neutrophils Relative %: 57 % (ref 43–77)
Platelets: 334 10*3/uL (ref 150–400)
RBC: 4.55 MIL/uL (ref 3.87–5.11)
RDW: 13.1 % (ref 11.5–15.5)
WBC: 7.8 10*3/uL (ref 4.0–10.5)

## 2014-12-15 LAB — URINALYSIS, MICROSCOPIC ONLY
CRYSTALS: NONE SEEN
Casts: NONE SEEN

## 2014-12-15 LAB — URINALYSIS, ROUTINE W REFLEX MICROSCOPIC
Bilirubin Urine: NEGATIVE
Glucose, UA: NEGATIVE mg/dL
KETONES UR: NEGATIVE mg/dL
Leukocytes, UA: NEGATIVE
Nitrite: NEGATIVE
PROTEIN: 30 mg/dL — AB
Specific Gravity, Urine: 1.013 (ref 1.005–1.030)
Urobilinogen, UA: 0.2 mg/dL (ref 0.0–1.0)
pH: 7 (ref 5.0–8.0)

## 2014-12-15 NOTE — Progress Notes (Signed)
HPI  A Caucasian 54 y.o.female presents to the office today due to hematuria that started 1 week ago.  She states her symptoms are constant and not changing.  She is having lower bilateral abdominal pain and back pain.  Her dull achy pain is a 1 out of 10 right now.  She has history of kidney stones in 2014.  She has seen [facility] before for the kidney stones and she states they put her on Flomax and other medications to help the stone pass.  She denies fever, chills, sweats, fatigue, SOB, CP, nausea, vomiting, diarrhea, constipation, dysuria, urgency, frequency, flank pain, vaginal bleeding, headaches, dizziness and lightheadedness.  CT Scan on 09/01/13- "IMPRESSION: Mild to moderate left hydroureteronephrosis due to a 4 mm stone near the left ureterovesical junction. There is left perinephric edema."  Review of Systems  Constitutional: Negative.  Negative for fever, chills, malaise/fatigue and diaphoresis.  HENT: Negative.   Eyes: Negative.   Respiratory: Negative.   Cardiovascular: Negative.  Negative for chest pain and leg swelling.  Gastrointestinal: Positive for abdominal pain. Negative for nausea, vomiting, diarrhea and constipation.  Genitourinary: Positive for hematuria. Negative for dysuria, urgency, frequency and flank pain.       No vaginal bleeding and LMP was 1 year ago.  Still has uterus and ovaries.  Musculoskeletal: Positive for back pain.  Neurological: Negative.  Negative for dizziness and headaches.   Past Medical History-  Past Medical History  Diagnosis Date  . Allergy   . Thyroid disease   . Hypertension   . Hyperlipidemia   . Insomnia   . Vitamin D deficiency   . Nephrolithiasis   . GERD (gastroesophageal reflux disease)   . Fatty liver disease, nonalcoholic 13/2440    [facility]  . Prediabetes    Medications-  Current Outpatient Prescriptions on File Prior to Visit  Medication Sig Dispense Refill  . ALPRAZolam (XANAX) 0.5 MG tablet Take 0.5 mg by  mouth at bedtime as needed for anxiety.    . cholecalciferol (VITAMIN D) 1000 UNITS tablet Take 10,000 Units by mouth daily.     Marland Kitchen diltiazem (DILACOR XR) 240 MG 24 hr capsule Take 1 capsule (240 mg total) by mouth daily. 90 capsule 2  . hydrochlorothiazide (HYDRODIURIL) 25 MG tablet Take 1 tablet (25 mg total) by mouth daily. 90 tablet 2  . ibuprofen (ADVIL,MOTRIN) 600 MG tablet Take 1 tablet (600 mg total) by mouth every 8 (eight) hours as needed for pain. 21 tablet 0  . levothyroxine (SYNTHROID, LEVOTHROID) 100 MCG tablet Take 1 tablet (100 mcg total) by mouth daily before breakfast. 30 tablet 1  . phentermine (ADIPEX-P) 37.5 MG tablet Take 1 tablet (37.5 mg total) by mouth daily before breakfast. 30 tablet 1  . quinapril (ACCUPRIL) 40 MG tablet Take 1 tablet (40 mg total) by mouth daily. 90 tablet 2   No current facility-administered medications on file prior to visit.   Allergies-  Allergies  Allergen Reactions  . Atenolol     Beta blockers  . Codeine Nausea Only  . Klonopin [Clonazepam]   . Penicillins Rash   Physical Exam BP 120/64 mmHg  Pulse 82  Temp(Src) 98 F (36.7 C) (Temporal)  Resp 16  Ht 5\' 4"  (1.626 m)  Wt 187 lb (84.823 kg)  BMI 32.08 kg/m2 Wt Readings from Last 3 Encounters:  12/15/14 187 lb (84.823 kg)  11/23/14 178 lb (80.74 kg)  07/13/14 184 lb (83.462 kg)  Vitals Reviewed. General Appearance: Well nourished, in  no apparent distress and had pleasant demeanor. Obese. Eyes:  PERRLA. EOMI. Conjunctiva is pink without edema, erythema or yellowing. No scleral icterus.  Neck: Supple, trachea is midline.  Full range of motion in neck intact Respiratory: CTAB,  r/r/w or stridor. No increased effort of breathing. No CVA tenderness. Cardio: RRR.   m/r/g.  S1S2nl.   Abdomen: Symmetrical, soft, rounded, tenderness upon palpation to suprapubic area only.  +BS nl x4.   Rebound.  Guarding. No bruits heard in aorta.  No masses, hernias or pulsatile masses.    Extremities:  C/C/E in upper and lower extremities. Pulses B/L +2  Skin: Warm, dry, intact without rashes, lesions, ecchymosis, yellowing, cyanosis.  Neuro: Alert and oriented X3, cooperative.  Mood and affect appropriate to situation.  CN II-XII grossly intact.  Psych: Insight and Judgment appropriate.   Assessment and Plan 1. Hematuria- Possible kidney stone? Or UTI? Ordered labs to check for blood and UTI. If positive for blood and no infection, then will order CT scan to check for kidney stones. Will have patient strain urine to check for kidney stones. - Urinalysis, Routine w reflex microscopic - Urine culture - CBC with Differential/Platelet - BASIC METABOLIC PANEL WITH GFR  2. Encounter for long-term (current) use of medications Will monitor kidney and liver function. - CBC with Differential/Platelet - BASIC METABOLIC PANEL WITH GFR  If abdominal pain or back pain gets worse or develop fever, chills, sweats, nausea, vomiting, difficulty urinating, then go to ED immediately.  Discussed medication effects and SE's.  Pt agreed to treatment plan. If you are not feeling better in 10-14 days, then please call the office. Please keep your follow up appt on 03/09/15.  Cyana Shook, Stephani Police, PA-C 9:02 AM [facility]

## 2014-12-15 NOTE — Patient Instructions (Signed)
-  Continue medications as prescribed. If infection, then I will send in antibiotic If blood in urine, then I will send for CT scan to check for kidney stones. Continue to strain urine right now for possible kidney stone.  I will message you on MyChart with results.  Hematuria Hematuria is blood in your urine. It can be caused by a bladder infection, kidney infection, prostate infection, kidney stone, or cancer of your urinary tract. Infections can usually be treated with medicine, and a kidney stone usually will pass through your urine. If neither of these is the cause of your hematuria, further workup to find out the reason may be needed. It is very important that you tell your health care provider about any blood you see in your urine, even if the blood stops without treatment or happens without causing pain. Blood in your urine that happens and then stops and then happens again can be a symptom of a very serious condition. Also, pain is not a symptom in the initial stages of many urinary cancers. HOME CARE INSTRUCTIONS   Drink lots of fluid, 3-4 quarts a day. If you have been diagnosed with an infection, cranberry juice is especially recommended, in addition to large amounts of water.  Avoid caffeine, tea, and carbonated beverages because they tend to irritate the bladder.  Avoid alcohol because it may irritate the prostate.  Take all medicines as directed by your health care provider.  If you were prescribed an antibiotic medicine, finish it all even if you start to feel better.  If you have been diagnosed with a kidney stone, follow your health care provider's instructions regarding straining your urine to catch the stone.  Empty your bladder often. Avoid holding urine for long periods of time.  After a bowel movement, women should cleanse front to back. Use each tissue only once.  Empty your bladder before and after sexual intercourse if you are a female. SEEK MEDICAL CARE  IF:  You develop back pain.  You have a fever.  You have a feeling of sickness in your stomach (nausea) or vomiting.  Your symptoms are not better in 3 days. Return sooner if you are getting worse. SEEK IMMEDIATE MEDICAL CARE IF:   You develop severe vomiting and are unable to keep the medicine down.  You develop severe back or abdominal pain despite taking your medicines.  You begin passing a large amount of blood or clots in your urine.  You feel extremely weak or faint, or you pass out. MAKE SURE YOU:   Understand these instructions.  Will watch your condition.  Will get help right away if you are not doing well or get worse. Document Released: 11/05/2005 Document Revised: 03/22/2014 Document Reviewed: 07/06/2013 [facility] Patient Information 2015 [place] [employer] [employer], [place]. This information is not intended to replace advice given to you by your health care provider. Make sure you discuss any questions you have with your health care provider.

## 2014-12-16 LAB — URINE CULTURE

## 2014-12-17 ENCOUNTER — Encounter: Payer: Self-pay | Admitting: Physician Assistant

## 2014-12-17 ENCOUNTER — Other Ambulatory Visit: Payer: 59

## 2014-12-17 ENCOUNTER — Other Ambulatory Visit: Payer: Self-pay | Admitting: Physician Assistant

## 2014-12-17 DIAGNOSIS — R319 Hematuria, unspecified: Secondary | ICD-10-CM

## 2014-12-17 DIAGNOSIS — R350 Frequency of micturition: Secondary | ICD-10-CM

## 2014-12-17 MED ORDER — SULFAMETHOXAZOLE-TRIMETHOPRIM 800-160 MG PO TABS: 1.0000 | ORAL_TABLET | Freq: Two times a day (BID) | ORAL | Status: DC

## 2014-12-17 NOTE — Telephone Encounter (Signed)
Based on bacteria in urine.  I will start patient on antibiotic (Bactrim) and will adjust when sensitivity report is back.  Called patient and told her that I am sending in Bactrim.  Patient states she does drink wine and has no issues, so should not have any issues with Sulfa.  Discussed medication effects and SE's.  Pt agreed to treatment plan. If you are not feeling better in 10-14 days, then please call the office.  Demar Shad, Stephani Police, PA-C 9:36 AM

## 2014-12-18 LAB — URINALYSIS, ROUTINE W REFLEX MICROSCOPIC
BILIRUBIN URINE: NEGATIVE
GLUCOSE, UA: NEGATIVE mg/dL
Ketones, ur: NEGATIVE mg/dL
Leukocytes, UA: NEGATIVE
Nitrite: NEGATIVE
PH: 5.5 (ref 5.0–8.0)
PROTEIN: 30 mg/dL — AB
Specific Gravity, Urine: 1.021 (ref 1.005–1.030)
Urobilinogen, UA: 0.2 mg/dL (ref 0.0–1.0)

## 2014-12-18 LAB — URINE CULTURE: Colony Count: 50000

## 2014-12-18 LAB — URINALYSIS, MICROSCOPIC ONLY: CASTS: NONE SEEN

## 2014-12-28 ENCOUNTER — Other Ambulatory Visit: Payer: Self-pay | Admitting: Internal Medicine

## 2014-12-28 ENCOUNTER — Encounter: Payer: Self-pay | Admitting: Physician Assistant

## 2015-01-05 ENCOUNTER — Encounter: Payer: Self-pay | Admitting: Physician Assistant

## 2015-01-12 ENCOUNTER — Other Ambulatory Visit: Payer: Self-pay | Admitting: Urology

## 2015-01-25 ENCOUNTER — Encounter (HOSPITAL_COMMUNITY): Payer: Self-pay | Admitting: *Deleted

## 2015-01-25 NOTE — Progress Notes (Signed)
Patient states it has been "about a year" since last menstrual cycle. States she is not sure if she is going or has been through menopause. Urine pregnancy order placed per protocol.

## 2015-01-28 NOTE — H&P (Signed)
Shannon. Rayburn Pope by CT scan had a 4 mm left ureterovesical junction stone when I saw her October 15. I could not see it on a KUB. Indication to Pope to [facility] were given. Urine culture was normal. She went to the emergency room October 21.    When I saw Shannon Pope in February, she was having gross hematuria. She took daily aspirin. She was not having any stone symptoms such as frequency or pain. She smoked 15 years ago. Cystoscopy was deferred for insurance reasons. I had given her ciprofloxacin and ordered a CT scan. Urine culture was negative. CT scan demonstrated an 8 mm stone at the left renal pelvis with some inflammation surrounding the mucosa.   Get Mr O'Connor's KUB. Renal shadows were normal. There were no bony abnormalities. Gas shadows were normal. I could see the 8 mm egg-shaped stone in the left renal pelvis. I did not see distal calcifications that appeared clinically relevant.    Past Medical History Problems  1. History of hypertension (Z86.79) 2. History of Hyperthyroidism (E05.90)  Surgical History Problems  1. History of Gallbladder Surgery 2. History of Tubal Ligation  Current Meds 1. Accupril TABS;  Therapy: (Recorded:15Oct2014) to Recorded 2. ALPRAZolam 0.5 MG Oral Tablet;  Therapy: 02Sep2014 to Recorded 3. Aspirin 81 MG Oral Tablet;  Therapy: (Recorded:15Oct2014) to Recorded 4. Cefpodoxime Proxetil 100 MG Oral Tablet;  Therapy: 21Oct2014 to Recorded 5. Ciprofloxacin HCl - 250 MG Oral Tablet; TAKE 1 TABLET BID;  Therapy: [id number] to (Evaluate:16Feb2016)  Requested for: [id number]; Last  Rx:09Feb2016 Ordered 6. Diltiazem HCl TABS;  Therapy: (Recorded:15Oct2014) to Recorded 7. Hydrochlorothiazide TABS;  Therapy: (Recorded:15Oct2014) to Recorded 8. Levothyroxine Sodium 100 MCG Oral Tablet;  Therapy: [id number] to Recorded 9. Multi-Vitamin TABS;  Therapy: (Recorded:15Oct2014) to Recorded 10. Phentermine HCl TABS;   Therapy: (Recorded:09Feb2016) to  Recorded  Allergies Medication  1. Penicillins 2. Vicodin TABS  Family History Problems  1. Family history of Cardiac Arrest : Father 2. Family history of Death In The Family Father : Father   father passed @ age 35cardiac arrest 6. Family history of Family Health Status Number Of Children   1 daughter 4. Family history of Heart Disease : Mother 5. Family history of Hypercholesterolemia : Mother 75. Family history of Hypertension : Mother  Social History Problems  1. Alcohol Use   socially 2. Caffeine Use   1 drink daily 3. History of tobacco use (Z87.891)   smoked one pack dailysmoked for 20 yearsquit smoking 17 years ago 4. Marital History - Currently Married 5. Occupation:   Museum/gallery conservator  Results/Data  Urine [Data Includes: Last 1 Day]   [id number]  COLOR YELLOW   APPEARANCE CLEAR   SPECIFIC GRAVITY 1.025   pH 5.5   GLUCOSE NEG mg/dL  BILIRUBIN NEG   KETONE NEG mg/dL  BLOOD MOD   PROTEIN TRACE mg/dL  UROBILINOGEN 0.2 mg/dL  NITRITE NEG   LEUKOCYTE ESTERASE NEG   SQUAMOUS EPITHELIAL/HPF MODERATE   WBC 7-10 WBC/hpf  RBC 11-20 RBC/hpf  BACTERIA MANY   CRYSTALS NONE SEEN   CASTS NONE SEEN   Other MUCUS NOTED    Assessment Assessed  1. Gross hematuria (R31.0) 2. Urolithiasis (N20.9)  Plan  Increased urinary frequency  1. URINE CULTURE; Status:In Progress - Specimen/Data Collected;   Done: [id number] Urolithiasis  2. Follow-up Schedule Surgery Office  Follow-up  Status: Complete  Done: [id number]  Discussion/Summary   I reviewed Shannon O'Connor's CT scan. I thought it was best to get a KUB  and, again, send her urine for culture, recognizing the last culture was normal.   I drew Shannon Pope a picture. I talked about lithotripsy.   We talked about ESWL in detail. Pros, cons, general surgical and anesthetic risks, and other options including watchful waiting and ureteroscopy were discussed. Success and failure rates and need for further/repeat  therapy were discussed. Risks were described but not limited to pain, infection, sepsis, and bleeding. The risk of renal and ureteral trauma with short and long term sequelae was discussed. The risk of injury to adjacent structures was discussed. The risk of needing a stent post-ESWL was discussed.  I talked about watchful waiting. She says often when she walks she will bleed after and maybe it is from the stone. We did not perform cystoscopy. Hopefully she will reach her treatment goal with lithotripsy. Sequelae of passing stones was discussed.  After a thorough review of the management options for the patient's condition the patient  elected to proceed with surgical therapy as noted above. We have discussed the potential benefits and risks of the procedure, side effects of the proposed treatment, the likelihood of the patient achieving the goals of the procedure, and any potential problems that might occur during the procedure or recuperation. Informed consent has been obtained.

## 2015-01-31 ENCOUNTER — Ambulatory Visit (HOSPITAL_COMMUNITY)
Admission: RE | Admit: 2015-01-31 | Discharge: 2015-01-31 | Disposition: A | Discharge status: Home / Self Care | Payer: 59 | Source: Ambulatory Visit | Attending: Urology | Admitting: Urology

## 2015-01-31 ENCOUNTER — Encounter (HOSPITAL_COMMUNITY)
Admission: RE | Disposition: A | Discharge status: Home / Self Care | Payer: Self-pay | Source: Ambulatory Visit | Attending: Urology

## 2015-01-31 ENCOUNTER — Encounter (HOSPITAL_COMMUNITY): Payer: Self-pay | Admitting: *Deleted

## 2015-01-31 ENCOUNTER — Ambulatory Visit (HOSPITAL_COMMUNITY): Payer: 59

## 2015-01-31 DIAGNOSIS — N200 Calculus of kidney: Secondary | ICD-10-CM | POA: Insufficient documentation

## 2015-01-31 DIAGNOSIS — Z7982 Long term (current) use of aspirin: Secondary | ICD-10-CM | POA: Diagnosis not present

## 2015-01-31 DIAGNOSIS — Z87891 Personal history of nicotine dependence: Secondary | ICD-10-CM | POA: Diagnosis not present

## 2015-01-31 DIAGNOSIS — Z79899 Other long term (current) drug therapy: Secondary | ICD-10-CM | POA: Insufficient documentation

## 2015-01-31 DIAGNOSIS — I10 Essential (primary) hypertension: Secondary | ICD-10-CM | POA: Insufficient documentation

## 2015-01-31 DIAGNOSIS — E0590 Thyrotoxicosis, unspecified without thyrotoxic crisis or storm: Secondary | ICD-10-CM | POA: Diagnosis not present

## 2015-01-31 HISTORY — DX: Unspecified osteoarthritis, unspecified site: M19.90

## 2015-01-31 LAB — PREGNANCY, URINE: Preg Test, Ur: NEGATIVE

## 2015-01-31 SURGERY — LITHOTRIPSY, ESWL
Anesthesia: LOCAL | Laterality: Left

## 2015-01-31 MED ORDER — CIPROFLOXACIN HCL 500 MG PO TABS
500.0000 mg | ORAL_TABLET | ORAL | Status: AC
  Administered 2015-01-31: 500 mg via ORAL
  Filled 2015-01-31: qty 1

## 2015-01-31 MED ORDER — DIPHENHYDRAMINE HCL 25 MG PO CAPS
25.0000 mg | ORAL_CAPSULE | ORAL | Status: AC
  Administered 2015-01-31: 25 mg via ORAL
  Filled 2015-01-31: qty 1

## 2015-01-31 MED ORDER — DEXTROSE-NACL 5-0.45 % IV SOLN
INTRAVENOUS | Status: DC
  Administered 2015-01-31: 15:00:00 via INTRAVENOUS

## 2015-01-31 MED ORDER — DIAZEPAM 5 MG PO TABS
10.0000 mg | ORAL_TABLET | ORAL | Status: AC
  Administered 2015-01-31: 10 mg via ORAL
  Filled 2015-01-31: qty 2

## 2015-01-31 NOTE — Interval H&P Note (Signed)
History and Physical Interval Note:  01/31/2015 7:25 AM  Shannon Pope  has presented today for surgery, with the diagnosis of LEFT RENAL STONE  The various methods of treatment have been discussed with the patient and family. After consideration of risks, benefits and other options for treatment, the patient has consented to  Procedure(s): LEFT EXTRACORPOREAL SHOCK WAVE LITHOTRIPSY (ESWL) (Left) as a surgical intervention .  The patient's history has been reviewed, patient examined, no change in status, stable for surgery.  I have reviewed the patient's chart and labs.  Questions were answered to the patient's satisfaction.     [name] A

## 2015-01-31 NOTE — Discharge Instructions (Signed)
I have reviewed discharge instructions in detail with the patient. They will follow-up with me or their physician as scheduled. My nurse will also be calling the patients as per protocol.  Do not start ASA or NSAID for 5 days It looks like she had stop taking 3 medications, for example cardiazem- please double check her home meds

## 2015-03-09 ENCOUNTER — Ambulatory Visit: Payer: Self-pay | Admitting: Physician Assistant

## 2015-05-06 ENCOUNTER — Other Ambulatory Visit: Payer: Self-pay | Admitting: Internal Medicine

## 2015-06-29 ENCOUNTER — Other Ambulatory Visit: Payer: Self-pay | Admitting: Internal Medicine

## 2015-07-09 ENCOUNTER — Other Ambulatory Visit: Payer: Self-pay | Admitting: Internal Medicine

## 2015-07-09 DIAGNOSIS — E039 Hypothyroidism, unspecified: Secondary | ICD-10-CM

## 2015-07-14 ENCOUNTER — Encounter: Payer: Self-pay | Admitting: Physician Assistant

## 2015-07-21 ENCOUNTER — Other Ambulatory Visit: Payer: Self-pay | Admitting: Internal Medicine

## 2015-08-24 ENCOUNTER — Encounter: Payer: Self-pay | Admitting: Physician Assistant

## 2015-08-24 ENCOUNTER — Ambulatory Visit (INDEPENDENT_AMBULATORY_CARE_PROVIDER_SITE_OTHER): Payer: 59 | Admitting: Physician Assistant

## 2015-08-24 VITALS — BP 124/60 | HR 75 | Temp 97.9°F | Resp 16 | Ht 63.5 in | Wt 202.0 lb

## 2015-08-24 DIAGNOSIS — N6012 Diffuse cystic mastopathy of left breast: Secondary | ICD-10-CM

## 2015-08-24 DIAGNOSIS — D649 Anemia, unspecified: Secondary | ICD-10-CM

## 2015-08-24 DIAGNOSIS — Z0000 Encounter for general adult medical examination without abnormal findings: Secondary | ICD-10-CM | POA: Diagnosis not present

## 2015-08-24 DIAGNOSIS — K760 Fatty (change of) liver, not elsewhere classified: Secondary | ICD-10-CM | POA: Insufficient documentation

## 2015-08-24 DIAGNOSIS — Z0001 Encounter for general adult medical examination with abnormal findings: Secondary | ICD-10-CM

## 2015-08-24 DIAGNOSIS — Z6830 Body mass index (BMI) 30.0-30.9, adult: Secondary | ICD-10-CM | POA: Insufficient documentation

## 2015-08-24 DIAGNOSIS — E079 Disorder of thyroid, unspecified: Secondary | ICD-10-CM

## 2015-08-24 DIAGNOSIS — Z87442 Personal history of urinary calculi: Secondary | ICD-10-CM

## 2015-08-24 DIAGNOSIS — E669 Obesity, unspecified: Secondary | ICD-10-CM | POA: Insufficient documentation

## 2015-08-24 DIAGNOSIS — I10 Essential (primary) hypertension: Secondary | ICD-10-CM

## 2015-08-24 DIAGNOSIS — E559 Vitamin D deficiency, unspecified: Secondary | ICD-10-CM

## 2015-08-24 DIAGNOSIS — Z1159 Encounter for screening for other viral diseases: Secondary | ICD-10-CM

## 2015-08-24 DIAGNOSIS — R7303 Prediabetes: Secondary | ICD-10-CM

## 2015-08-24 DIAGNOSIS — N6011 Diffuse cystic mastopathy of right breast: Secondary | ICD-10-CM

## 2015-08-24 DIAGNOSIS — Z79899 Other long term (current) drug therapy: Secondary | ICD-10-CM | POA: Insufficient documentation

## 2015-08-24 DIAGNOSIS — IMO0001 Reserved for inherently not codable concepts without codable children: Secondary | ICD-10-CM

## 2015-08-24 DIAGNOSIS — G4700 Insomnia, unspecified: Secondary | ICD-10-CM | POA: Insufficient documentation

## 2015-08-24 DIAGNOSIS — E785 Hyperlipidemia, unspecified: Secondary | ICD-10-CM

## 2015-08-24 LAB — CBC WITH DIFFERENTIAL/PLATELET
Basophils Absolute: 0.1 10*3/uL (ref 0.0–0.1)
Basophils Relative: 1 % (ref 0–1)
EOS ABS: 0.4 10*3/uL (ref 0.0–0.7)
Eosinophils Relative: 5 % (ref 0–5)
HCT: 37.9 % (ref 36.0–46.0)
HEMOGLOBIN: 13.2 g/dL (ref 12.0–15.0)
Lymphocytes Relative: 35 % (ref 12–46)
Lymphs Abs: 2.8 10*3/uL (ref 0.7–4.0)
MCH: 30.3 pg (ref 26.0–34.0)
MCHC: 34.8 g/dL (ref 30.0–36.0)
MCV: 87.1 fL (ref 78.0–100.0)
MPV: 9.9 fL (ref 8.6–12.4)
Monocytes Absolute: 0.7 10*3/uL (ref 0.1–1.0)
Monocytes Relative: 9 % (ref 3–12)
NEUTROS ABS: 4.1 10*3/uL (ref 1.7–7.7)
NEUTROS PCT: 50 % (ref 43–77)
Platelets: 341 10*3/uL (ref 150–400)
RBC: 4.35 MIL/uL (ref 3.87–5.11)
RDW: 13.7 % (ref 11.5–15.5)
WBC: 8.1 10*3/uL (ref 4.0–10.5)

## 2015-08-24 MED ORDER — ALPRAZOLAM 0.5 MG PO TABS: 0.5000 mg | ORAL_TABLET | Freq: Two times a day (BID) | ORAL | Status: DC | PRN

## 2015-08-24 MED ORDER — PHENTERMINE HCL 37.5 MG PO TABS: 37.5000 mg | ORAL_TABLET | Freq: Every day | ORAL | Status: DC

## 2015-08-24 NOTE — Progress Notes (Signed)
Complete Physical  Assessment and Plan: 1. Essential hypertension - continue medications, DASH diet, exercise and monitor at home. Call if greater than 130/80.  - CBC with Differential/Platelet - BASIC METABOLIC PANEL WITH GFR - Hepatic function panel - Urinalysis, Routine w reflex microscopic (not at [facility]) - Microalbumin / creatinine urine ratio - EKG 12-Lead - DG Chest 2 View; Future  2. Thyroid disease Hypothyroidism-check TSH level, continue medications the same, reminded to take on an empty stomach 30-98mins before food.  - TSH  3. Prediabetes Discussed general issues about diabetes pathophysiology and management., Educational material distributed., Suggested low cholesterol diet., Encouraged aerobic exercise., Discussed foot care., Reminded to get yearly retinal exam. - Hemoglobin A1c - Insulin, fasting  4. Hyperlipidemia -continue medications, check lipids, decrease fatty foods, increase activity.  - Lipid panel  5. Vitamin D deficiency - Vit D  25 hydroxy (rtn osteoporosis monitoring)  6. Medication management - Magnesium  7. Bilateral fibrocystic breast disease (FCBD) OVER DUE MGM, schedule, suggest getting 3D, CATEGORY D BREAST  8. Nonalcoholic fatty liver disease Check labs, avoid tylenol, alcohol, weight loss advised.  - Hepatic function panel  9. History of nephrolithiasis Increase water  10. Insomnia - ALPRAZolam (XANAX) 0.5 MG tablet; Take 1 tablet (0.5 mg total) by mouth 2 (two) times daily as needed for anxiety.  Dispense: 60 tablet; Refill: 0  11. Obesity, Class II, BMI 35-39.9, with comorbidity ([place]) Obesity with co morbidities- long discussion about weight loss, diet, and exercise - phentermine (ADIPEX-P) 37.5 MG tablet; Take 1 tablet (37.5 mg total) by mouth daily before breakfast.  Dispense: 30 tablet; Refill: 2  12. Encounter for general adult medical examination with abnormal findings Call Dr. Deatra Ina, get MGM - CBC with  Differential/Platelet - BASIC METABOLIC PANEL WITH GFR - Hepatic function panel - TSH - Lipid panel - Hemoglobin A1c - Insulin, fasting - Magnesium - Vit D  25 hydroxy (rtn osteoporosis monitoring) - Urinalysis, Routine w reflex microscopic (not at [facility]) - Microalbumin / creatinine urine ratio - Iron and TIBC - Ferritin - Vitamin B12 - EKG 12-Lead - DG Chest 2 View; Future  13. Anemia, unspecified anemia type - Iron and TIBC - Ferritin - Vitamin B12   Discussed med's effects and SE's. Screening labs and tests as requested with regular follow-up as recommended.  HPI 54 y.o. female  presents for a complete physical. She has had elevated blood pressure since 2000. Her blood pressure has been controlled at home, today their BP is BP: 124/60 mmHg She does workout, walks 5 miles a day. She denies chest pain, shortness of breath, dizziness.  She is on cholesterol medication and denies myalgias. Her cholesterol is at goal. The cholesterol last visit was:   Lab Results  Component Value Date   CHOL 184 11/23/2014   HDL 45 11/23/2014   LDLCALC 98 11/23/2014   TRIG 205* 11/23/2014   CHOLHDL 4.1 11/23/2014    She has been working on diet and exercise for prediabetes, and denies paresthesia of the feet, polydipsia and polyuria. Last A1C in the office was:  Lab Results  Component Value Date   HGBA1C 5.7* 11/23/2014   Patient is on Vitamin D supplement.   Lab Results  Component Value Date   VD25OH 77 11/23/2014     She is on thyroid medication. Her medication was changed last visit, she was suppose to follow up in a month but did not, she is suppose to be on 112mcg daily but 180mcg Tues,Thursday but she  never received the message and never followed up. Patient complains of weight gain, fatigue,  Lab Results  Component Value Date   TSH 12.120* 11/23/2014  .  Her BMI is Body mass index is 35.22 kg/(m^2)., she is working on diet and exercise and has done well.  Wt Readings from  Last 3 Encounters:  08/24/15 202 lb (91.627 kg)  01/31/15 184 lb (83.462 kg)  12/15/14 187 lb (84.823 kg)   Has a history of kidney stones, had left lithotripsy in March of this year with Dr. Matilde Sprang.  Very rare trouble sleeping, takes xanax PRN if this happens.  Sister and mother passed in 10/08/2014, stroke and sudden death due heart valve disease.   Current Medications:    Medication List       This list is accurate as of: 08/24/15  2:07 PM.  Always use your most recent med list.               ALPRAZolam 0.5 MG tablet  Commonly known as:  XANAX  Take 0.5 mg by mouth at bedtime as needed for anxiety.     cholecalciferol 1000 UNITS tablet  Commonly known as:  VITAMIN D  Take 10,000 Units by mouth daily.     diltiazem 240 MG 24 hr capsule  Commonly known as:  DILACOR XR  Take 1 capsule (240 mg total) by mouth daily.     diltiazem 240 MG 24 hr capsule  Commonly known as:  CARDIZEM CD  TAKE 1 CAPSULE BY MOUTH  EVERY DAY FOR BLOOD  PRESSURE     FISH OIL PO  Take 1 capsule by mouth daily.     hydrochlorothiazide 25 MG tablet  Commonly known as:  HYDRODIURIL  Take 1 tablet by mouth  daily     levothyroxine 100 MCG tablet  Commonly known as:  SYNTHROID, LEVOTHROID  Take 1 tablet by mouth  daily before breakfast     multivitamin with minerals Tabs tablet  Take 1 tablet by mouth daily.     quinapril 40 MG tablet  Commonly known as:  ACCUPRIL  Take 1 tablet by mouth  daily        Health Maintenance:   Immunization History  Administered Date(s) Administered  . Pneumococcal-Unspecified 04/10/2010  . Td 11/03/2008   Tetanus: 2009 Pneumovax: 2011 Prevnar 13: age 61 Flu vaccine: declines Zostavax: N/A Pap: 06/2014 normal, due 2018 MGM: 02/2014 suggest 3D, CAT D- DUE DEXA: N/A Colonoscopy:2011 due 2016 Dr. Deatra Ina EGD: N/A CXR: DUE  Allergies:  Allergies  Allergen Reactions  . Atenolol     Beta blockers. Pt doesn't remember there being a problem  .  Codeine Nausea Only  . Klonopin [Clonazepam] Rash  . Penicillins Rash   Medical History:  Past Medical History  Diagnosis Date  . Allergy   . Thyroid disease   . Hypertension   . Hyperlipidemia   . Insomnia   . Vitamin D deficiency   . Nephrolithiasis   . GERD (gastroesophageal reflux disease)   . Fatty liver disease, nonalcoholic 28/3151    Via U/S  . Prediabetes   . Arthritis    Surgical History:  Past Surgical History  Procedure Laterality Date  . Cholecystectomy    . Tubal ligation     Family History:  Family History  Problem Relation Age of Onset  . Heart Problems Mother   . Hypertension Mother    Social History:  Social History  Substance Use Topics  . Smoking status:  Former Smoker -- 1.00 packs/day for 10 years    Quit date: 11/03/1993  . Smokeless tobacco: Never Used     Comment: quit 15years ago  . Alcohol Use: Yes     Comment: occasionally   Review of Systems  Constitutional: Positive for malaise/fatigue. Negative for fever, chills, weight loss and diaphoresis.  HENT: Negative.   Eyes: Negative.   Respiratory: Negative.  Negative for cough and shortness of breath.   Cardiovascular: Negative.  Negative for chest pain.  Genitourinary: Negative.  Negative for dysuria, urgency and frequency.  Musculoskeletal: Positive for joint pain (right knee, has OA, has had shot before). Negative for myalgias, back pain, falls and neck pain.  Skin: Negative.   Neurological: Negative.  Negative for dizziness and weakness.  Psychiatric/Behavioral: Negative for depression, suicidal ideas, hallucinations, memory loss and substance abuse. The patient has insomnia (occ will take xanax). The patient is not nervous/anxious.     Physical Exam: Estimated body mass index is 35.22 kg/(m^2) as calculated from the following:   Height as of this encounter: 5' 3.5" (1.613 m).   Weight as of this encounter: 202 lb (91.627 kg). BP 124/60 mmHg  Pulse 75  Temp(Src) 97.9 F (36.6 C)  (Temporal)  Resp 16  Ht 5' 3.5" (1.613 m)  Wt 202 lb (91.627 kg)  BMI 35.22 kg/m2  SpO2 99%  LMP  General Appearance: Well nourished, in no apparent distress. Eyes: PERRLA, EOMs, conjunctiva no swelling or erythema, normal fundi and vessels. Sinuses: No Frontal/maxillary tenderness ENT/Mouth: Ext aud canals clear, normal light reflex with TMs without erythema, bulging.  Good dentition. No erythema, swelling, or exudate on post pharynx. Tonsils not swollen or erythematous. Hearing normal.  Neck: Supple, thyroid normal. No bruits Respiratory: Respiratory effort normal, BS equal bilaterally without rales, rhonchi, wheezing or stridor. Cardio: RRR without murmurs, rubs or gallops. Brisk peripheral pulses without edema.  Chest: symmetric, with normal excursions and percussion. Breasts: Symmetric lumps bilateral breast, without nipple discharge, retractions. Abdomen: Soft, +BS. Non tender, no guarding, rebound, hernias, masses, or organomegaly.  Lymphatics: Non tender without lymphadenopathy.  Genitourinary: defer, 3 years.  Musculoskeletal: Full ROM all peripheral extremities,5/5 strength, and normal gait. Skin: Warm, dry without rashes, lesions, ecchymosis.  Neuro: Cranial nerves intact, reflexes equal bilaterally. Normal muscle tone, no cerebellar symptoms. Sensation intact.  Psych: Awake and oriented X 3, normal affect, Insight and Judgment appropriate.   EKG: WNL no changes. AORTA SCAN:  defer   Vicie Mutters 2:07 PM

## 2015-08-24 NOTE — Patient Instructions (Addendum)
[facility]  7 a.m.-6:30 p.m., Monday 7 a.m.-5 p.m., Tuesday-Friday Schedule an appointment by calling [phone].  Encourage you to get the 3D Mammogram  The 3D Mammogram is much more specific and sensitive to pick up breast cancer. For women with fibrocystic breast or lumpy breast it can be hard to determine if it is cancer or not but the 3D mammogram is able to tell this difference which cuts back on unneeded additional tests or scary call backs.   - over 40% increase in detection of breast cancer - over 40% reduction in false positives.  - fewer call backs - reduced anxiety - improved outcomes - [facility]  Dr. Deatra Ina [phone]   Phentermine  While taking the medication we may ask that you come into the office once a month or once every 2-3 months to monitor your weight, blood pressure, and heart rate. In addition we can help answer your questions about diet, exercise, and help you every step of the way with your weight loss journey. Sometime it is helpful if you bring in a food diary or use an app on your phone such as myfitnesspal to record your calorie intake, especially in the beginning.   You can start out on 1/3 to 1/2 a pill in the morning and if you are tolerating it well you can increase to one pill daily. I also have some patients that take 1/3 or 1/2 at lunch to help prevent night time eating.  This medication is cheapest CASH pay at [place] is 14-17 dollars and you do NOT need a membership to get meds from there.    What is this medicine? PHENTERMINE (FEN ter meen) decreases your appetite. This medicine is intended to be used in addition to a healthy reduced calorie diet and exercise. The best results are achieved this way. This medicine is only indicated for short-term use. Eventually your weight loss may level out and the medication will no longer be needed.   How should I use this medicine? Take this  medicine by mouth. Follow the directions on the prescription label. The tablets should stay in the bottle until immediately before you take your dose. Take your doses at regular intervals. Do not take your medicine more often than directed.  Overdosage: If you think you have taken too much of this medicine contact a poison control center or emergency room at once. NOTE: This medicine is only for you. Do not share this medicine with others.  What if I miss a dose? If you miss a dose, take it as soon as you can. If it is almost time for your next dose, take only that dose. Do not take double or extra doses. Do not increase or in any way change your dose without consulting your doctor.  What should I watch for while using this medicine? Notify your physician immediately if you become short of breath while doing your normal activities. Do not take this medicine within 6 hours of bedtime. It can keep you from getting to sleep. Avoid drinks that contain caffeine and try to stick to a regular bedtime every night. Do not stand or sit up quickly, especially if you are an older patient. This reduces the risk of dizzy or fainting spells. Avoid alcoholic drinks.  What side effects may I notice from receiving this medicine? Side effects that you should report to your doctor or health care professional as soon as possible: -  chest pain, palpitations -depression or severe changes in mood -increased blood pressure -irritability -nervousness or restlessness -severe dizziness -shortness of breath -problems urinating -unusual swelling of the legs -vomiting  Side effects that usually do not require medical attention (report to your doctor or health care professional if they continue or are bothersome): -blurred vision or other eye problems -changes in sexual ability or desire -constipation or diarrhea -difficulty sleeping -dry mouth or unpleasant taste -headache -nausea This list may not describe all  possible side effects. Call your doctor for medical advice about side effects. You may report side effects to FDA at 1-800-FDA-1088.   Before you even begin to attack a weight-loss plan, it pays to remember this: You are not fat. You have fat. Losing weight isn't about blame or shame; it's simply another achievement to accomplish. Dieting is like any other skill-you have to buckle down and work at it. As long as you act in a smart, reasonable way, you'll ultimately get where you want to be. Here are some weight loss pearls for you.  1. It's Not a Diet. It's a Lifestyle Thinking of a diet as something you're on and suffering through only for the short term doesn't work. To shed weight and keep it off, you need to make permanent changes to the way you eat. It's OK to indulge occasionally, of course, but if you cut calories temporarily and then revert to your old way of eating, you'll gain back the weight quicker than you can say yo-yo. Use it to lose it. Research shows that one of the best predictors of long-term weight loss is how many pounds you drop in the first month. For that reason, nutritionists often suggest being stricter for the first two weeks of your new eating strategy to build momentum. Cut out added sugar and alcohol and avoid unrefined carbs. After that, figure out how you can reincorporate them in a way that's healthy and maintainable.  2. There's a Right Way to Exercise Working out burns calories and fat and boosts your metabolism by building muscle. But those trying to lose weight are notorious for overestimating the number of calories they burn and underestimating the amount they take in. Unfortunately, your system is biologically programmed to hold on to extra pounds and that means when you start exercising, your body senses the deficit and ramps up its hunger signals. If you're not diligent, you'll eat everything you burn and then some. Use it to lose it. Cardio gets all the exercise  glory, but strength and interval training are the real heroes. They help you build lean muscle, which in turn increases your metabolism and calorie-burning ability 3. Don't Overreact to Mild Hunger Some people have a hard time losing weight because of hunger anxiety. To them, being hungry is bad-something to be avoided at all costs-so they carry snacks with them and eat when they don't need to. Others eat because they're stressed out or bored. While you never want to get to the point of being ravenous (that's when bingeing is likely to happen), a hunger pang, a craving, or the fact that it's 3:00 p.m. should not send you racing for the vending machine or obsessing about the energy bar in your purse. Ideally, you should put off eating until your stomach is growling and it's difficult to concentrate.  Use it to lose it. When you feel the urge to eat, use the HALT method. Ask yourself, Am I really hungry? Or am I angry or anxious, lonely  or bored, or tired? If you're still not certain, try the apple test. If you're truly hungry, an apple should seem delicious; if it doesn't, something else is going on. Or you can try drinking water and making yourself busy, if you are still hungry try a healthy snack.  4. Not All Calories Are Created Equal The mechanics of weight loss are pretty simple: Take in fewer calories than you use for energy. But the kind of food you eat makes all the difference. Processed food that's high in saturated fat and refined starch or sugar can cause inflammation that disrupts the hormone signals that tell your brain you're full. The result: You eat a lot more.  Use it to lose it. Clean up your diet. Swap in whole, unprocessed foods, including vegetables, lean protein, and healthy fats that will fill you up and give you the biggest nutritional bang for your calorie buck. In a few weeks, as your brain starts receiving regular hunger and fullness signals once again, you'll notice that you feel  less hungry overall and naturally start cutting back on the amount you eat.  5. Protein, Produce, and Plant-Based Fats Are Your Weight-Loss Trinity Here's why eating the three Ps regularly will help you drop pounds. Protein fills you up. You need it to build lean muscle, which keeps your metabolism humming so that you can torch more fat. People in a weight-loss program who ate double the recommended daily allowance for protein (about 110 grams for a 150-pound woman) lost 70 percent of their weight from fat, while people who ate the RDA lost only about 40 percent, one study found. Produce is packed with filling fiber. "It's very difficult to consume too many calories if you're eating a lot of vegetables. Example: Three cups of broccoli is a lot of food, yet only 93 calories. (Fruit is another story. It can be easy to overeat and can contain a lot of calories from sugar, so be sure to monitor your intake.) Plant-based fats like olive oil and those in avocados and nuts are healthy and extra satiating.  Use it to lose it. Aim to incorporate each of the three Ps into every meal and snack. People who eat protein throughout the day are able to keep weight off, according to a study in the [place] of Clinical Nutrition. In addition to meat, poultry and seafood, good sources are beans, lentils, eggs, tofu, and yogurt. As for fat, keep portion sizes in check by measuring out salad dressing, oil, and nut butters (shoot for one to two tablespoons). Finally, eat veggies or a little fruit at every meal. People who did that consumed 308 fewer calories but didn't feel any hungrier than when they didn't eat more produce.  7. How You Eat Is As Important As What You Eat In order for your brain to register that you're full, you need to focus on what you're eating. Sit down whenever you eat, preferably at a table. Turn off the TV or computer, put down your phone, and look at your food. Smell it. Chew slowly, and don't  put another bite on your fork until you swallow. When women ate lunch this attentively, they consumed 30 percent less when snacking later than those who listened to an audiobook at lunchtime, according to a study in the [place] of Nutrition. 8. Weighing Yourself Really Works The scale provides the best evidence about whether your efforts are paying off. Seeing the numbers tick up or down or stagnate is  motivation to keep going-or to rethink your approach. A 2015 study at [facility] found that daily weigh-ins helped people lose more weight, keep it off, and maintain that loss, even after two years. Use it to lose it. Step on the scale at the same time every day for the best results. If your weight shoots up several pounds from one weigh-in to the next, don't freak out. Eating a lot of salt the night before or having your period is the likely culprit. The number should return to normal in a day or two. It's a steady climb that you need to do something about. 9. Too Much Stress and Too Little Sleep Are Your Enemies When you're tired and frazzled, your body cranks up the production of cortisol, the stress hormone that can cause carb cravings. Not getting enough sleep also boosts your levels of ghrelin, a hormone associated with hunger, while suppressing leptin, a hormone that signals fullness and satiety. People on a diet who slept only five and a half hours a night for two weeks lost 55 percent less fat and were hungrier than those who slept eight and a half hours, according to a study in the [place]. Use it to lose it. Prioritize sleep, aiming for seven hours or more a night, which research shows helps lower stress. And make sure you're getting quality zzz's. If a snoring spouse or a fidgety cat wakes you up frequently throughout the night, you may end up getting the equivalent of just four hours of sleep, according to a study from [facility]. Keep pets out  of the bedroom, and use a white-noise app to drown out snoring. 10. You Will Hit a plateau-And You Can Bust Through It As you slim down, your body releases much less leptin, the fullness hormone.  If you're not strength training, start right now. Building muscle can raise your metabolism to help you overcome a plateau. To keep your body challenged and burning calories, incorporate new moves and more intense intervals into your workouts or add another sweat session to your weekly routine. Alternatively, cut an extra 100 calories or so a day from your diet. Now that you've lost weight, your body simply doesn't need as much fuel.   We want weight loss that will last so you should lose 1-2 pounds a week.  THAT IS IT! Please pick THREE things a month to change. Once it is a habit check off the item. Then pick another three items off the list to become habits.  If you are already doing a habit on the list GREAT!  Cross that item off! o Don't drink your calories. Ie, alcohol, soda, fruit juice, and sweet tea.  o Drink more water. Drink a glass when you feel hungry or before each meal.  o Eat breakfast - Complex carb and protein (likeDannon light and fit yogurt, oatmeal, fruit, eggs, [place] bacon). o Measure your cereal.  Eat no more than one cup a day. (ie [place]) o Eat an apple a day. o Add a vegetable a day. o Try a new vegetable a month. o Use Pam! Stop using oil or butter to cook. o Don't finish your plate or use smaller plates. o Share your dessert. o Eat sugar free Jello for dessert or frozen grapes. o Don't eat 2-3 hours before bed. o Switch to whole wheat bread, pasta, and brown rice. o Make healthier choices when you eat out. No fries! o Pick baked chicken, NOT fried.  o Don't forget to SLOW DOWN when you eat. It is not going anywhere.  o Take the stairs. o Park far away in the parking lot o [employer] (or weights) for 10 minutes while watching TV. o Walk at work for 10 minutes during  break. o Walk outside 1 time a week with your friend, kids, dog, or significant other. o Start a walking group at [place] the mall as much as you can tolerate.  o Keep a food diary. o Weigh yourself daily. o Walk for 15 minutes 3 days per week. o Cook at home more often and eat out less.  If life happens and you go back to old habits, it is okay.  Just start over. You can do it!   If you experience chest pain, get short of breath, or tired during the exercise, please stop immediately and inform your doctor.

## 2015-08-25 LAB — BASIC METABOLIC PANEL WITH GFR
BUN: 11 mg/dL (ref 7–25)
CO2: 27 mmol/L (ref 20–31)
CREATININE: 0.61 mg/dL (ref 0.50–1.05)
Calcium: 9.2 mg/dL (ref 8.6–10.4)
Chloride: 101 mmol/L (ref 98–110)
GFR, Est African American: >89 mL/min (ref >=60)
Glucose, Bld: 90 mg/dL (ref 65–99)
POTASSIUM: 4 mmol/L (ref 3.5–5.3)
SODIUM: 137 mmol/L (ref 135–146)

## 2015-08-25 LAB — URINALYSIS, ROUTINE W REFLEX MICROSCOPIC
Bilirubin Urine: NEGATIVE
GLUCOSE, UA: NEGATIVE
Hgb urine dipstick: NEGATIVE
Ketones, ur: NEGATIVE
Leukocytes, UA: NEGATIVE
Nitrite: NEGATIVE
PROTEIN: NEGATIVE
SPECIFIC GRAVITY, URINE: 1.009 (ref 1.001–1.035)
pH: 7 (ref 5.0–8.0)

## 2015-08-25 LAB — HEPATIC FUNCTION PANEL
ALT: 34 U/L — ABNORMAL HIGH (ref 6–29)
AST: 37 U/L — AB (ref 10–35)
Albumin: 4.2 g/dL (ref 3.6–5.1)
Alkaline Phosphatase: 77 U/L (ref 33–130)
BILIRUBIN DIRECT: 0.1 mg/dL (ref <=0.2)
BILIRUBIN INDIRECT: 0.3 mg/dL (ref 0.2–1.2)
BILIRUBIN TOTAL: 0.4 mg/dL (ref 0.2–1.2)
Total Protein: 6.7 g/dL (ref 6.1–8.1)

## 2015-08-25 LAB — LIPID PANEL
Cholesterol: 161 mg/dL (ref 125–200)
HDL: 40 mg/dL — AB (ref >=46)
LDL Cholesterol: 94 mg/dL (ref <130)
Total CHOL/HDL Ratio: 4 Ratio (ref <=5.0)
Triglycerides: 133 mg/dL (ref <150)
VLDL: 27 mg/dL (ref <30)

## 2015-08-25 LAB — FERRITIN: Ferritin: 42 ng/mL (ref 10–291)

## 2015-08-25 LAB — INSULIN, FASTING: Insulin fasting, serum: 9.4 u[IU]/mL (ref 2.0–19.6)

## 2015-08-25 LAB — IRON AND TIBC
%SAT: 17 % (ref 11–50)
Iron: 58 ug/dL (ref 45–160)
TIBC: 336 ug/dL (ref 250–450)
UIBC: 278 ug/dL (ref 125–400)

## 2015-08-25 LAB — VITAMIN B12: Vitamin B-12: 1243 pg/mL — ABNORMAL HIGH (ref 211–911)

## 2015-08-25 LAB — MICROALBUMIN / CREATININE URINE RATIO
Creatinine, Urine: 23.7 mg/dL
Microalb, Ur: <0.2 mg/dL (ref <2.0)

## 2015-08-25 LAB — HEMOGLOBIN A1C
Hgb A1c MFr Bld: 6.1 % — ABNORMAL HIGH (ref <5.7)
Mean Plasma Glucose: 128 mg/dL — ABNORMAL HIGH (ref <117)

## 2015-08-25 LAB — VITAMIN D 25 HYDROXY (VIT D DEFICIENCY, FRACTURES): VIT D 25 HYDROXY: 82 ng/mL (ref 30–100)

## 2015-08-25 LAB — MAGNESIUM: Magnesium: 2.1 mg/dL (ref 1.5–2.5)

## 2015-08-25 LAB — TSH: TSH: 3.796 u[IU]/mL (ref 0.350–4.500)

## 2015-08-25 LAB — HEPATITIS C ANTIBODY: HCV Ab: NEGATIVE

## 2015-08-26 LAB — HIV ANTIBODY (ROUTINE TESTING W REFLEX): HIV: NONREACTIVE

## 2015-08-29 ENCOUNTER — Telehealth: Payer: Self-pay | Admitting: Gastroenterology

## 2015-08-29 ENCOUNTER — Other Ambulatory Visit: Payer: Self-pay

## 2015-08-29 DIAGNOSIS — Z1231 Encounter for screening mammogram for malignant neoplasm of breast: Secondary | ICD-10-CM

## 2015-08-29 NOTE — Telephone Encounter (Signed)
Dr Deatra Ina noted in her chart on 12/2009 that she should have a repeat colonoscopy in 5 years.

## 2015-08-29 NOTE — Telephone Encounter (Signed)
LM on Vmail to CB to sch'd colon

## 2015-08-30 ENCOUNTER — Encounter: Payer: Self-pay | Admitting: Gastroenterology

## 2015-09-23 ENCOUNTER — Ambulatory Visit
Admission: RE | Admit: 2015-09-23 | Discharge: 2015-09-23 | Disposition: A | Discharge status: Home / Self Care | Payer: 59 | Source: Ambulatory Visit

## 2015-09-23 DIAGNOSIS — Z1231 Encounter for screening mammogram for malignant neoplasm of breast: Secondary | ICD-10-CM

## 2015-10-17 ENCOUNTER — Other Ambulatory Visit: Payer: Self-pay | Admitting: Physician Assistant

## 2015-10-17 ENCOUNTER — Other Ambulatory Visit: Payer: Self-pay | Admitting: *Deleted

## 2015-10-17 ENCOUNTER — Ambulatory Visit (AMBULATORY_SURGERY_CENTER): Payer: Self-pay | Admitting: *Deleted

## 2015-10-17 VITALS — Ht 64.0 in | Wt 199.2 lb

## 2015-10-17 DIAGNOSIS — G4700 Insomnia, unspecified: Secondary | ICD-10-CM

## 2015-10-17 DIAGNOSIS — Z86010 Personal history of colonic polyps: Secondary | ICD-10-CM

## 2015-10-17 MED ORDER — SUPREP BOWEL PREP KIT 17.5-3.13-1.6 GM/177ML PO SOLN: 1.0000 | Freq: Once | ORAL | Status: DC

## 2015-10-17 MED ORDER — ALPRAZOLAM 0.5 MG PO TABS: 0.5000 mg | ORAL_TABLET | Freq: Two times a day (BID) | ORAL | Status: DC | PRN

## 2015-10-17 NOTE — Progress Notes (Signed)
Patient denies any allergies to egg or soy products. Patient denies complications with anesthesia/sedation.  Patient denies oxygen use at home.   Patient is taking phentermine and informed to stop the medication 10 days prior to procedure.  Patient verbalized understanding.  Emmi instructions for colonoscopy explained and given to patient.

## 2015-10-20 ENCOUNTER — Encounter: Payer: Self-pay | Admitting: Gastroenterology

## 2015-10-31 ENCOUNTER — Ambulatory Visit (AMBULATORY_SURGERY_CENTER): Payer: 59 | Admitting: Gastroenterology

## 2015-10-31 ENCOUNTER — Encounter: Payer: Self-pay | Admitting: Gastroenterology

## 2015-10-31 VITALS — BP 119/79 | HR 66 | Temp 97.7°F | Resp 16 | Ht 64.0 in | Wt 199.0 lb

## 2015-10-31 DIAGNOSIS — Z86010 Personal history of colonic polyps: Secondary | ICD-10-CM | POA: Diagnosis not present

## 2015-10-31 DIAGNOSIS — D122 Benign neoplasm of ascending colon: Secondary | ICD-10-CM | POA: Diagnosis not present

## 2015-10-31 MED ORDER — SODIUM CHLORIDE 0.9 % IV SOLN: 500.0000 mL | INTRAVENOUS | Status: DC

## 2015-10-31 NOTE — Progress Notes (Signed)
Patient denies any allergies to eggs or soy. 

## 2015-10-31 NOTE — Progress Notes (Signed)
No problems noted in the recovery room. maw 

## 2015-10-31 NOTE — Patient Instructions (Signed)
YOU HAD AN ENDOSCOPIC PROCEDURE TODAY AT [facility]:   Refer to the procedure report that was given to you for any specific questions about what was found during the examination.  If the procedure report does not answer your questions, please call your gastroenterologist to clarify.  If you requested that your care partner not be given the details of your procedure findings, then the procedure report has been included in a sealed envelope for you to review at your convenience later.  YOU SHOULD EXPECT: Some feelings of bloating in the abdomen. Passage of more gas than usual.  Walking can help get rid of the air that was put into your GI tract during the procedure and reduce the bloating. If you had a lower endoscopy (such as a colonoscopy or flexible sigmoidoscopy) you may notice spotting of blood in your stool or on the toilet paper. If you underwent a bowel prep for your procedure, you may not have a normal bowel movement for a few days.  Please Note:  You might notice some irritation and congestion in your nose or some drainage.  This is from the oxygen used during your procedure.  There is no need for concern and it should clear up in a day or so.  SYMPTOMS TO REPORT IMMEDIATELY:   Following lower endoscopy (colonoscopy or flexible sigmoidoscopy):  Excessive amounts of blood in the stool  Significant tenderness or worsening of abdominal pains  Swelling of the abdomen that is new, acute  Fever of 100F or higher   For urgent or emergent issues, a gastroenterologist can be reached at any hour by calling [phone].   DIET: Your first meal following the procedure should be a small meal and then it is ok to progress to your normal diet. Heavy or fried foods are harder to digest and may make you feel nauseous or bloated.  Likewise, meals heavy in dairy and vegetables can increase bloating.  Drink plenty of fluids but you should avoid alcoholic beverages for 24  hours.  ACTIVITY:  You should plan to take it easy for the rest of today and you should NOT DRIVE or use heavy machinery until tomorrow (because of the sedation medicines used during the test).    FOLLOW UP: Our staff will call the number listed on your records the next business day following your procedure to check on you and address any questions or concerns that you may have regarding the information given to you following your procedure. If we do not reach you, we will leave a message.  However, if you are feeling well and you are not experiencing any problems, there is no need to return our call.  We will assume that you have returned to your regular daily activities without incident.  If any biopsies were taken you will be contacted by phone or by letter within the next 1-3 weeks.  Please call us at [phone] if you have not heard about the biopsies in 3 weeks.    SIGNATURES/CONFIDENTIALITY: You and/or your care partner have signed paperwork which will be entered into your electronic medical record.  These signatures attest to the fact that that the information above on your After Visit Summary has been reviewed and is understood.  Full responsibility of the confidentiality of this discharge information lies with you and/or your care-partner.    Handouts were given to your care partner on polyps, hemorrhoids, and the [facility] system pamphlet on hemorrhoids. Please hold aspirin and  all NSAIDs for two weeks. You may resume your other current medications today. Await biopsy results. Please call if any questions or concerns.

## 2015-10-31 NOTE — Progress Notes (Signed)
Called to room to assist during endoscopic procedure.  Patient ID and intended procedure confirmed with present staff. Received instructions for my participation in the procedure from the performing physician.  

## 2015-10-31 NOTE — Progress Notes (Signed)
A/ox3, pleased with MAC, report to RN 

## 2015-10-31 NOTE — Op Note (Signed)
[place]  [address] [place], [id number]   COLONOSCOPY PROCEDURE REPORT  PATIENT: Shannon Pope, Shannon Pope  MR#: [id number] BIRTHDATE: 21-Apr-1961 , 54  yrs. old GENDER: female ENDOSCOPIST: Yetta Flock, MD REFERRED BY: Unk Pinto MD PROCEDURE DATE:  10/31/2015 PROCEDURE:   Colonoscopy, surveillance , Colonoscopy with snare polypectomy, and Submucosal injection, any substance First Screening Colonoscopy - Avg.  risk and is 50 yrs.  old or older - No.  Prior Negative Screening - Now for repeat screening. N/A  History of Adenoma - Now for follow-up colonoscopy & has been > or = to 3 yrs.  Yes hx of adenoma.  Has been 3 or more years since last colonoscopy.  Polyps removed today? Yes ASA CLASS:   Class II INDICATIONS:Surveillance due to prior colonic neoplasia and Colorectal Neoplasm Risk Assessment for this procedure is average risk. MEDICATIONS: Propofol 450 mg IV  DESCRIPTION OF PROCEDURE:   After the risks benefits and alternatives of the procedure were thoroughly explained, informed consent was obtained.  The digital rectal exam revealed no abnormalities of the rectum.   The LB [id number]  endoscope was introduced through the anus and advanced to the cecum, which was identified by both the appendix and ileocecal valve. No adverse events experienced.   The quality of the prep was adequate  The instrument was then slowly withdrawn as the colon was fully examined. Estimated blood loss is zero unless otherwise noted in this procedure report.   COLON FINDINGS: A roughly 8-31mm sessile polyp was noted in the ascending colon.  Using a stiff snare the polyp was difficult to grasp, and suspect this polyp may have been at a prior polypectomy site, thus a saline lift was used and the polyp was then easily removed with endocut snare.  The remainder of the examined colon was normal.  Retroflexed views revealed small internal hemorrhoids. The time to cecum  = 3.8 Withdrawal time = 18.5   The scope was withdrawn and the procedure completed. COMPLICATIONS: There were no immediate complications.  ENDOSCOPIC IMPRESSION: Ascending colon polyp as described above, removed with saline lift / endocut snare. Normal remainder of examined colon.  RECOMMENDATIONS: 1.  Hold Aspirin and all other NSAIDS for 2 weeks. 2.  Await pathology results 3.  Resume diet 4.  Resume medications  eSigned:  Yetta Flock, MD 10/31/2015 2:20 PM   cc: Unk Pinto MD, the patient

## 2015-11-01 ENCOUNTER — Telehealth: Payer: Self-pay

## 2015-11-01 NOTE — Telephone Encounter (Signed)
  Follow up Call-  Call back number 10/31/2015  Post procedure Call Back phone  # [phone]  Permission to leave phone message Yes     Patient questions:  Do you have a fever, pain , or abdominal swelling? No. Pain Score  0 *  Have you tolerated food without any problems? Yes.    Have you been able to return to your normal activities? Yes.    Do you have any questions about your discharge instructions: Diet   No. Medications  No. Follow up visit  No.  Do you have questions or concerns about your Care? No.  Actions: * If pain score is 4 or above: No action needed, pain <4.

## 2015-11-08 ENCOUNTER — Encounter: Payer: Self-pay | Admitting: Gastroenterology

## 2015-11-09 ENCOUNTER — Other Ambulatory Visit: Payer: Self-pay | Admitting: Internal Medicine

## 2015-11-09 ENCOUNTER — Other Ambulatory Visit: Payer: 59

## 2015-11-09 DIAGNOSIS — E079 Disorder of thyroid, unspecified: Secondary | ICD-10-CM

## 2015-11-09 LAB — TSH: TSH: 0.788 u[IU]/mL (ref 0.350–4.500)

## 2015-12-10 ENCOUNTER — Other Ambulatory Visit: Payer: Self-pay | Admitting: Internal Medicine

## 2015-12-14 ENCOUNTER — Other Ambulatory Visit: Payer: Self-pay | Admitting: Physician Assistant

## 2016-02-22 ENCOUNTER — Ambulatory Visit (INDEPENDENT_AMBULATORY_CARE_PROVIDER_SITE_OTHER): Payer: 59 | Admitting: Physician Assistant

## 2016-02-22 ENCOUNTER — Encounter: Payer: Self-pay | Admitting: Physician Assistant

## 2016-02-22 VITALS — BP 130/80 | HR 77 | Temp 97.9°F | Resp 16 | Ht 63.5 in | Wt 202.4 lb

## 2016-02-22 DIAGNOSIS — E559 Vitamin D deficiency, unspecified: Secondary | ICD-10-CM

## 2016-02-22 DIAGNOSIS — R7303 Prediabetes: Secondary | ICD-10-CM

## 2016-02-22 DIAGNOSIS — E785 Hyperlipidemia, unspecified: Secondary | ICD-10-CM

## 2016-02-22 DIAGNOSIS — K760 Fatty (change of) liver, not elsewhere classified: Secondary | ICD-10-CM | POA: Diagnosis not present

## 2016-02-22 DIAGNOSIS — I10 Essential (primary) hypertension: Secondary | ICD-10-CM

## 2016-02-22 DIAGNOSIS — Z79899 Other long term (current) drug therapy: Secondary | ICD-10-CM | POA: Diagnosis not present

## 2016-02-22 DIAGNOSIS — E079 Disorder of thyroid, unspecified: Secondary | ICD-10-CM | POA: Diagnosis not present

## 2016-02-22 DIAGNOSIS — IMO0001 Reserved for inherently not codable concepts without codable children: Secondary | ICD-10-CM

## 2016-02-22 LAB — CBC WITH DIFFERENTIAL/PLATELET
BASOS ABS: 88 {cells}/uL (ref 0–200)
Basophils Relative: 1 %
EOS ABS: 264 {cells}/uL (ref 15–500)
Eosinophils Relative: 3 %
HEMATOCRIT: 44.2 % (ref 35.0–45.0)
HEMOGLOBIN: 14.9 g/dL (ref 11.7–15.5)
Lymphocytes Relative: 34 %
Lymphs Abs: 2992 cells/uL (ref 850–3900)
MCH: 29.9 pg (ref 27.0–33.0)
MCHC: 33.7 g/dL (ref 32.0–36.0)
MCV: 88.8 fL (ref 80.0–100.0)
MONO ABS: 704 {cells}/uL (ref 200–950)
MONOS PCT: 8 %
MPV: 10.4 fL (ref 7.5–12.5)
NEUTROS ABS: 4752 {cells}/uL (ref 1500–7800)
Neutrophils Relative %: 54 %
Platelets: 357 10*3/uL (ref 140–400)
RBC: 4.98 MIL/uL (ref 3.80–5.10)
RDW: 13.7 % (ref 11.0–15.0)
WBC: 8.8 10*3/uL (ref 3.8–10.8)

## 2016-02-22 MED ORDER — PHENTERMINE HCL 37.5 MG PO TABS: 37.5000 mg | ORAL_TABLET | Freq: Every day | ORAL | Status: DC

## 2016-02-22 NOTE — Progress Notes (Signed)
Assessment and Plan:  Hypertension: Continue medication, monitor blood pressure at home. Continue DASH diet.  Reminder to go to the ER if any CP, SOB, nausea, dizziness, severe HA, changes vision/speech, left arm numbness and tingling, and jaw pain. Cholesterol: Continue diet and exercise. Check cholesterol.  Pre-diabetes-Continue diet and exercise. Check A1C Vitamin D Def- check level and continue medications.  Obesity with co morbidities- long discussion about weight loss, diet, and exercise, will start the patient on phentermine- hand out given and AE's discussed, will do close follow up.    Continue diet and meds as discussed. Further disposition pending results of labs.  HPI 55 y.o. female  presents for 3 month follow up with hypertension, hyperlipidemia, prediabetes and vitamin D. Her blood pressure has been controlled at home, today their BP is BP: 130/80 mmHg She does workout, walks daily, getting 10,000 steps.  She denies chest pain, shortness of breath, dizziness.  She is on cholesterol medication and denies myalgias. Her cholesterol is not at goal. The cholesterol last visit was:   Lab Results  Component Value Date   CHOL 161 08/24/2015   HDL 40* 08/24/2015   LDLCALC 94 08/24/2015   TRIG 133 08/24/2015   CHOLHDL 4.0 08/24/2015   She has been working on diet and exercise for prediabetes, and denies paresthesia of the feet, polydipsia, polyuria and visual disturbances. Last A1C in the office was:  Lab Results  Component Value Date   HGBA1C 6.1* 08/24/2015   Patient is on Vitamin D supplement.   Lab Results  Component Value Date   VD25OH 69 08/24/2015   Sister and mother passed in November 2015, this was first, she is very tearful, her sister had stroke and sudden death due to heart valve issues, mother got pneumonia.  BMI is Body mass index is 35.29 kg/(m^2)., she is working on diet and exercise, she is on phentermine x 1 month, states it is helping with weight loss.  Wt  Readings from Last 3 Encounters:  02/22/16 202 lb 6.4 oz (91.808 kg)  10/31/15 199 lb (90.266 kg)  10/17/15 199 lb 3.2 oz (90.357 kg)  She is on thyroid medication, on 1 pill daily except 1.5 pills 1 day a week. Her medication was not changed last visit.  Lab Results  Component Value Date   TSH 0.788 11/09/2015  .   Current Medications:  Current Outpatient Prescriptions on File Prior to Visit  Medication Sig Dispense Refill  . ALPRAZolam (XANAX) 0.5 MG tablet Take 1 tablet (0.5 mg total) by mouth 2 (two) times daily as needed for anxiety. 180 tablet 0  . aspirin 81 MG tablet Take 81 mg by mouth daily.    . cholecalciferol (VITAMIN D) 1000 UNITS tablet Take 10,000 Units by mouth daily.     Marland Kitchen diltiazem (CARDIZEM CD) 240 MG 24 hr capsule TAKE 1 CAPSULE BY MOUTH  EVERY DAY FOR BLOOD  PRESSURE 90 capsule 1  . hydrochlorothiazide (HYDRODIURIL) 25 MG tablet Take 1 tablet by mouth  daily 90 tablet 1  . levothyroxine (SYNTHROID, LEVOTHROID) 100 MCG tablet Take 1 tablet by mouth  daily before breakfast 90 tablet 2  . Multiple Vitamin (MULTIVITAMIN WITH MINERALS) TABS tablet Take 1 tablet by mouth daily.    . Omega-3 Fatty Acids (FISH OIL PO) Take 1 capsule by mouth daily.    . phentermine (ADIPEX-P) 37.5 MG tablet Take 1 tablet (37.5 mg total) by mouth daily before breakfast. 30 tablet 2  . quinapril (ACCUPRIL) 40 MG tablet  Take 1 tablet by mouth  daily 90 tablet 1   No current facility-administered medications on file prior to visit.   Medical History:  Past Medical History  Diagnosis Date  . Allergy   . Thyroid disease   . Hypertension   . Hyperlipidemia   . Insomnia   . Vitamin D deficiency   . Nephrolithiasis   . GERD (gastroesophageal reflux disease)   . Fatty liver disease, nonalcoholic [id number]    [facility]ia [facility]  . Prediabetes     no meds - diet controlled  . Arthritis     hip  . Anxiety    Allergies:  Allergies  Allergen Reactions  . Atenolol     Beta blockers. Pt doesn't  remember there being a problem  . Codeine Nausea Only  . Klonopin [Clonazepam] Rash  . Penicillins Rash    Review of Systems:  Review of Systems  Constitutional: Positive for malaise/fatigue. Negative for fever, chills, weight loss and diaphoresis.  HENT: Positive for congestion and sore throat. Negative for ear discharge, ear pain, hearing loss, nosebleeds and tinnitus.   Eyes: Negative.   Respiratory: Positive for cough. Negative for hemoptysis, sputum production, shortness of breath, wheezing and stridor.   Cardiovascular: Negative.   Gastrointestinal: Negative.   Genitourinary: Negative.   Musculoskeletal: Negative.   Skin: Negative.   Neurological: Positive for headaches. Negative for dizziness, tingling, tremors, sensory change, speech change, focal weakness, seizures, loss of consciousness and weakness.  Psychiatric/Behavioral: Positive for depression. Negative for suicidal ideas, hallucinations, memory loss and substance abuse. The patient is not nervous/anxious and does not have insomnia.     Family history- Review and unchanged Social history- Review and unchanged Physical Exam: BP 130/80 mmHg  Pulse 77  Temp(Src) 97.9 F (36.6 C) (Temporal)  Resp 16  Ht 5' 3.5" (1.613 m)  Wt 202 lb 6.4 oz (91.808 kg)  BMI 35.29 kg/m2  SpO2 99%  LMP 09/01/2013 Wt Readings from Last 3 Encounters:  02/22/16 202 lb 6.4 oz (91.808 kg)  10/31/15 199 lb (90.266 kg)  10/17/15 199 lb 3.2 oz (90.357 kg)   General Appearance: Well nourished, in no apparent distress. Eyes: PERRLA, EOMs, conjunctiva no swelling or erythema Sinuses: + Frontal/maxillary tenderness ENT/Mouth: Ext aud canals clear, TMs without erythema, bulging. No erythema, swelling, or exudate on post pharynx.  Tonsils not swollen or erythematous. Hearing normal.  Neck: Supple, thyroid normal.  Respiratory: Respiratory effort normal, BS equal bilaterally without rales, rhonchi, wheezing or stridor.  Cardio: RRR with no MRGs.  Brisk peripheral pulses without edema.  Abdomen: Soft, + BS.  Non tender, no guarding, rebound, hernias, masses. Lymphatics: Non tender without lymphadenopathy.  Musculoskeletal: Full ROM, 5/5 strength, normal gait.  Skin: Warm, dry without rashes, lesions, ecchymosis.  Neuro: Cranial nerves intact. Normal muscle tone, no cerebellar symptoms. Sensation intact.  Psych: Awake and oriented X 3, normal affect, Insight and Judgment appropriate.    Vicie Mutters, PA-C 4:30 PM [facility]

## 2016-02-22 NOTE — Patient Instructions (Signed)
We want weight loss that will last so you should lose 1-2 pounds a week.  THAT IS IT! Please pick THREE things a month to change. Once it is a habit check off the item. Then pick another three items off the list to become habits.  If you are already doing a habit on the list GREAT!  Cross that item off! o Don't drink your calories. Ie, alcohol, soda, fruit juice, and sweet tea.  o Drink more water. Drink a glass when you feel hungry or before each meal.  o Eat breakfast - Complex carb and protein (likeDannon light and fit yogurt, oatmeal, fruit, eggs, turkey bacon). o Measure your cereal.  Eat no more than one cup a day. (ie Kashi) o Eat an apple a day. o Add a vegetable a day. o Try a new vegetable a month. o Use Pam! Stop using oil or butter to cook. o Don't finish your plate or use smaller plates. o Share your dessert. o Eat sugar free Jello for dessert or frozen grapes. o Don't eat 2-3 hours before bed. o Switch to whole wheat bread, pasta, and brown rice. o Make healthier choices when you eat out. No fries! o Pick baked chicken, NOT fried. o Don't forget to SLOW DOWN when you eat. It is not going anywhere.  o Take the stairs. o Park far away in the parking lot o Lift soup cans (or weights) for 10 minutes while watching TV. o Walk at work for 10 minutes during break. o Walk outside 1 time a week with your friend, kids, dog, or significant other. o Start a walking group at church. o Walk the mall as much as you can tolerate.  o Keep a food diary. o Weigh yourself daily. o Walk for 15 minutes 3 days per week. o Cook at home more often and eat out less.  If life happens and you go back to old habits, it is okay.  Just start over. You can do it!   If you experience chest pain, get short of breath, or tired during the exercise, please stop immediately and inform your doctor.   Before you even begin to attack a weight-loss plan, it pays to remember this: You are not fat. You have fat.  Losing weight isn't about blame or shame; it's simply another achievement to accomplish. Dieting is like any other skill-you have to buckle down and work at it. As long as you act in a smart, reasonable way, you'll ultimately get where you want to be. Here are some weight loss pearls for you.  1. It's Not a Diet. It's a Lifestyle Thinking of a diet as something you're on and suffering through only for the short term doesn't work. To shed weight and keep it off, you need to make permanent changes to the way you eat. It's OK to indulge occasionally, of course, but if you cut calories temporarily and then revert to your old way of eating, you'll gain back the weight quicker than you can say yo-yo. Use it to lose it. Research shows that one of the best predictors of long-term weight loss is how many pounds you drop in the first month. For that reason, nutritionists often suggest being stricter for the first two weeks of your new eating strategy to build momentum. Cut out added sugar and alcohol and avoid unrefined carbs. After that, figure out how you can reincorporate them in a way that's healthy and maintainable.  2. There's a Right   Way to Exercise Working out burns calories and fat and boosts your metabolism by building muscle. But those trying to lose weight are notorious for overestimating the number of calories they burn and underestimating the amount they take in. Unfortunately, your system is biologically programmed to hold on to extra pounds and that means when you start exercising, your body senses the deficit and ramps up its hunger signals. If you're not diligent, you'll eat everything you burn and then some. Use it to lose it. Cardio gets all the exercise glory, but strength and interval training are the real heroes. They help you build lean muscle, which in turn increases your metabolism and calorie-burning ability 3. Don't Overreact to Mild Hunger Some people have a hard time losing weight because  of hunger anxiety. To them, being hungry is bad-something to be avoided at all costs-so they carry snacks with them and eat when they don't need to. Others eat because they're stressed out or bored. While you never want to get to the point of being ravenous (that's when bingeing is likely to happen), a hunger pang, a craving, or the fact that it's 3:00 p.m. should not send you racing for the vending machine or obsessing about the energy bar in your purse. Ideally, you should put off eating until your stomach is growling and it's difficult to concentrate.  Use it to lose it. When you feel the urge to eat, use the HALT method. Ask yourself, Am I really hungry? Or am I angry or anxious, lonely or bored, or tired? If you're still not certain, try the apple test. If you're truly hungry, an apple should seem delicious; if it doesn't, something else is going on. Or you can try drinking water and making yourself busy, if you are still hungry try a healthy snack.  4. Not All Calories Are Created Equal The mechanics of weight loss are pretty simple: Take in fewer calories than you use for energy. But the kind of food you eat makes all the difference. Processed food that's high in saturated fat and refined starch or sugar can cause inflammation that disrupts the hormone signals that tell your brain you're full. The result: You eat a lot more.  Use it to lose it. Clean up your diet. Swap in whole, unprocessed foods, including vegetables, lean protein, and healthy fats that will fill you up and give you the biggest nutritional bang for your calorie buck. In a few weeks, as your brain starts receiving regular hunger and fullness signals once again, you'll notice that you feel less hungry overall and naturally start cutting back on the amount you eat.  5. Protein, Produce, and Plant-Based Fats Are Your Weight-Loss Trinity Here's why eating the three Ps regularly will help you drop pounds. Protein fills you up. You need it  to build lean muscle, which keeps your metabolism humming so that you can torch more fat. People in a weight-loss program who ate double the recommended daily allowance for protein (about 110 grams for a 150-pound woman) lost 70 percent of their weight from fat, while people who ate the RDA lost only about 40 percent, one study found. Produce is packed with filling fiber. "It's very difficult to consume too many calories if you're eating a lot of vegetables. Example: Three cups of broccoli is a lot of food, yet only 93 calories. (Fruit is another story. It can be easy to overeat and can contain a lot of calories from sugar, so be sure to   monitor your intake.) Plant-based fats like olive oil and those in avocados and nuts are healthy and extra satiating.  Use it to lose it. Aim to incorporate each of the three Ps into every meal and snack. People who eat protein throughout the day are able to keep weight off, according to a study in the American Journal of Clinical Nutrition. In addition to meat, poultry and seafood, good sources are beans, lentils, eggs, tofu, and yogurt. As for fat, keep portion sizes in check by measuring out salad dressing, oil, and nut butters (shoot for one to two tablespoons). Finally, eat veggies or a little fruit at every meal. People who did that consumed 308 fewer calories but didn't feel any hungrier than when they didn't eat more produce.  7. How You Eat Is As Important As What You Eat In order for your brain to register that you're full, you need to focus on what you're eating. Sit down whenever you eat, preferably at a table. Turn off the TV or computer, put down your phone, and look at your food. Smell it. Chew slowly, and don't put another bite on your fork until you swallow. When women ate lunch this attentively, they consumed 30 percent less when snacking later than those who listened to an audiobook at lunchtime, according to a study in the British Journal of Nutrition. 8.  Weighing Yourself Really Works The scale provides the best evidence about whether your efforts are paying off. Seeing the numbers tick up or down or stagnate is motivation to keep going-or to rethink your approach. A 2015 study at [employer] found that daily weigh-ins helped people lose more weight, keep it off, and maintain that loss, even after two years. Use it to lose it. Step on the scale at the same time every day for the best results. If your weight shoots up several pounds from one weigh-in to the next, don't freak out. Eating a lot of salt the night before or having your period is the likely culprit. The number should return to normal in a day or two. It's a steady climb that you need to do something about. 9. Too Much Stress and Too Little Sleep Are Your Enemies When you're tired and frazzled, your body cranks up the production of cortisol, the stress hormone that can cause carb cravings. Not getting enough sleep also boosts your levels of ghrelin, a hormone associated with hunger, while suppressing leptin, a hormone that signals fullness and satiety. People on a diet who slept only five and a half hours a night for two weeks lost 55 percent less fat and were hungrier than those who slept eight and a half hours, according to a study in the [employer] Journal. Use it to lose it. Prioritize sleep, aiming for seven hours or more a night, which research shows helps lower stress. And make sure you're getting quality zzz's. If a snoring spouse or a fidgety cat wakes you up frequently throughout the night, you may end up getting the equivalent of just four hours of sleep, according to a study from [employer]. Keep pets out of the bedroom, and use a white-noise app to drown out snoring. 10. You Will Hit a plateau-And You Can Bust Through It As you slim down, your body releases much less leptin, the fullness hormone.  If you're not strength training, start right now.  Building muscle can raise your metabolism to help you overcome a plateau. To keep your body challenged   burning calories, incorporate new moves and more intense intervals into your workouts or add another sweat session to your weekly routine. Alternatively, cut an extra 100 calories or so a day from your diet. Now that you've lost weight, your body simply doesn't need as much fuel.      Bad carbs also include fruit juice, alcohol, and sweet tea. These are empty calories that do not signal to your brain that you are full.   Please remember the good carbs are still carbs which convert into sugar. So please measure them out no more than 1/2-1 cup of rice, oatmeal, pasta, and beans  Veggies are however free foods! Pile them on.   Not all fruit is created equal. Please see the list below, the fruit at the bottom is higher in sugars than the fruit at the top. Please avoid all dried fruits.     

## 2016-02-23 LAB — BASIC METABOLIC PANEL WITH GFR
BUN: 9 mg/dL (ref 7–25)
CO2: 25 mmol/L (ref 20–31)
Calcium: 10.1 mg/dL (ref 8.6–10.4)
Chloride: 98 mmol/L (ref 98–110)
Creat: 0.52 mg/dL (ref 0.50–1.05)
GLUCOSE: 96 mg/dL (ref 65–99)
POTASSIUM: 4.2 mmol/L (ref 3.5–5.3)
SODIUM: 137 mmol/L (ref 135–146)

## 2016-02-23 LAB — HEPATIC FUNCTION PANEL
ALK PHOS: 91 U/L (ref 33–130)
ALT: 50 U/L — AB (ref 6–29)
AST: 51 U/L — ABNORMAL HIGH (ref 10–35)
Albumin: 4.3 g/dL (ref 3.6–5.1)
BILIRUBIN DIRECT: 0.2 mg/dL (ref <=0.2)
BILIRUBIN INDIRECT: 0.5 mg/dL (ref 0.2–1.2)
TOTAL PROTEIN: 7.3 g/dL (ref 6.1–8.1)
Total Bilirubin: 0.7 mg/dL (ref 0.2–1.2)

## 2016-02-23 LAB — TSH: TSH: 2.51 mIU/L

## 2016-02-23 LAB — LIPID PANEL
CHOL/HDL RATIO: 4.5 ratio (ref <=5.0)
CHOLESTEROL: 176 mg/dL (ref 125–200)
HDL: 39 mg/dL — ABNORMAL LOW (ref >=46)
LDL CALC: 105 mg/dL (ref <130)
TRIGLYCERIDES: 162 mg/dL — AB (ref <150)
VLDL: 32 mg/dL — AB (ref <30)

## 2016-02-23 LAB — HEMOGLOBIN A1C
Hgb A1c MFr Bld: 5.9 % — ABNORMAL HIGH (ref <5.7)
Mean Plasma Glucose: 123 mg/dL

## 2016-02-23 LAB — VITAMIN D 25 HYDROXY (VIT D DEFICIENCY, FRACTURES): Vit D, 25-Hydroxy: 118 ng/mL — ABNORMAL HIGH (ref 30–100)

## 2016-02-23 LAB — MAGNESIUM: MAGNESIUM: 1.8 mg/dL (ref 1.5–2.5)

## 2016-02-27 ENCOUNTER — Encounter: Payer: Self-pay | Admitting: Physician Assistant

## 2016-03-26 ENCOUNTER — Encounter: Payer: Self-pay | Admitting: Gastroenterology

## 2016-04-27 ENCOUNTER — Other Ambulatory Visit: Payer: Self-pay | Admitting: Physician Assistant

## 2016-04-27 DIAGNOSIS — G4700 Insomnia, unspecified: Secondary | ICD-10-CM

## 2016-04-27 MED ORDER — ALPRAZOLAM 0.5 MG PO TABS: 0.5000 mg | ORAL_TABLET | Freq: Two times a day (BID) | ORAL | Status: DC | PRN

## 2016-05-31 ENCOUNTER — Ambulatory Visit: Payer: Self-pay | Admitting: Physician Assistant

## 2016-06-20 ENCOUNTER — Other Ambulatory Visit: Payer: Self-pay | Admitting: Internal Medicine

## 2016-07-16 ENCOUNTER — Encounter: Payer: Self-pay | Admitting: Physician Assistant

## 2016-08-27 ENCOUNTER — Encounter: Payer: Self-pay | Admitting: Physician Assistant

## 2016-09-21 ENCOUNTER — Other Ambulatory Visit: Payer: Self-pay | Admitting: Internal Medicine

## 2016-09-30 ENCOUNTER — Other Ambulatory Visit: Payer: Self-pay | Admitting: Internal Medicine

## 2016-10-09 ENCOUNTER — Other Ambulatory Visit: Payer: Self-pay | Admitting: Physician Assistant

## 2016-10-19 ENCOUNTER — Other Ambulatory Visit: Payer: Self-pay | Admitting: Physician Assistant

## 2016-11-07 ENCOUNTER — Ambulatory Visit (INDEPENDENT_AMBULATORY_CARE_PROVIDER_SITE_OTHER): Payer: 59 | Admitting: Physician Assistant

## 2016-11-07 ENCOUNTER — Encounter: Payer: Self-pay | Admitting: Physician Assistant

## 2016-11-07 VITALS — BP 128/74 | HR 75 | Temp 97.7°F | Resp 16 | Ht 64.5 in | Wt 222.8 lb

## 2016-11-07 DIAGNOSIS — N6011 Diffuse cystic mastopathy of right breast: Secondary | ICD-10-CM | POA: Diagnosis not present

## 2016-11-07 DIAGNOSIS — R7303 Prediabetes: Secondary | ICD-10-CM

## 2016-11-07 DIAGNOSIS — N6012 Diffuse cystic mastopathy of left breast: Secondary | ICD-10-CM

## 2016-11-07 DIAGNOSIS — E785 Hyperlipidemia, unspecified: Secondary | ICD-10-CM

## 2016-11-07 DIAGNOSIS — E079 Disorder of thyroid, unspecified: Secondary | ICD-10-CM

## 2016-11-07 DIAGNOSIS — K760 Fatty (change of) liver, not elsewhere classified: Secondary | ICD-10-CM

## 2016-11-07 DIAGNOSIS — E559 Vitamin D deficiency, unspecified: Secondary | ICD-10-CM | POA: Diagnosis not present

## 2016-11-07 DIAGNOSIS — E669 Obesity, unspecified: Secondary | ICD-10-CM

## 2016-11-07 DIAGNOSIS — I10 Essential (primary) hypertension: Secondary | ICD-10-CM | POA: Diagnosis not present

## 2016-11-07 DIAGNOSIS — IMO0001 Reserved for inherently not codable concepts without codable children: Secondary | ICD-10-CM

## 2016-11-07 DIAGNOSIS — Z0001 Encounter for general adult medical examination with abnormal findings: Secondary | ICD-10-CM | POA: Diagnosis not present

## 2016-11-07 DIAGNOSIS — Z136 Encounter for screening for cardiovascular disorders: Secondary | ICD-10-CM | POA: Diagnosis not present

## 2016-11-07 DIAGNOSIS — Z87442 Personal history of urinary calculi: Secondary | ICD-10-CM

## 2016-11-07 DIAGNOSIS — R6889 Other general symptoms and signs: Secondary | ICD-10-CM

## 2016-11-07 DIAGNOSIS — G4700 Insomnia, unspecified: Secondary | ICD-10-CM

## 2016-11-07 DIAGNOSIS — Z79899 Other long term (current) drug therapy: Secondary | ICD-10-CM

## 2016-11-07 MED ORDER — NALTREXONE-BUPROPION HCL ER 8-90 MG PO TB12: ORAL_TABLET | ORAL | 3 refills | Status: DC

## 2016-11-07 NOTE — Patient Instructions (Signed)
Who Qualifies for Obesity Medications? Although everyone is hopeful for a fast and easy way to lose weight, nothing has been shown to replace a prudent, calorie-controlled diet along with behavior modification as a cornerstone for all obesity treatments.  The next tool that can be used to achieve weight-loss and health improvement is medication. Pharmacotherapy may be offered to individuals affected by obesity who have failed to achieve weight-loss through diet and exercise alone. Currently there are several drugs that are approved by the FDA for weight-loss: phentermine products (Adipex-P or Suprenza)  lorcaserin HCI (Belviq)  phentermine- topiramate ER (Qsymia)  Bupropion; Naltrexone ER (Contrave)  Let's take a closer look at each of these medications and learn how they work:  Phentermine (Adipex-P or Suprenza) How does it work? Phentermine is a medication available by prescription that works on chemicals in the brain to decrease your appetite. It also has a mild stimulant component that adds extra energy. Phentermine is a pill that is taken once a day in the morning time. Tolerance to this medication can develop, so it can only be used for several months at a time. Common side effects are dry mouth, sleeplessness, constipation. Weight-loss: The average weight-loss is 4-5 percent of your weight after one-year. In a 200 pound person, this means about 10 pounds of weight-loss. Patients who receive phentermine can usually expect to see greater weight-loss than those who receive non-pharmacologic care, on average about 13 pounds difference over 12 weeks as reported in one study. Concerns: Due to its stimulant effect, a person's blood pressure and heart rate may increase when on this medication; therefore, you must be monitored closely by a physician who is experienced in prescribing this medication. It cannot be used in patients with some heart conditions (such as poorly controlled blood pressure),  glaucoma (increased pressure in your eye), stroke or overactive thyroid. There is some concern for abuse, but this is minimal if the medication is appropriately used as directed by a healthcare professional.  Lorcaserin (Belviq) How does it work? Lorcaserin was approved in June 2012 by the FDA and became commercially available in June 2013. It works by helping you feel full while eating less, and it works on the chemicals in your brain to help decrease your appetite. Weight-loss: In individuals who took the medication for one-year, it has been shown to have an average of 7 percent weight-loss. In a 200 pound person, this would mean a 14 pound weight-loss. Blood sugar, cholesterol and blood pressure levels have also been shown to improve. Concerns: The most common side effects are headache, dizziness, fatigue, dry mouth, upper [id number] tract infection and nausea.  Response to therapy should be evaluated by week 12.  If a patient has not lost at least 5% of baseline body weigh  Phentermine-Topiramate ER (Qsymia) How does it work? This combination medication was approved by the FDA in July 2012. Topiramate is a medication used to treat seizures. It was found that a common side effect of this medication was weight-loss. Phentermine, as described in this brochure, helps to increase your energy and decrease your appetite. Weight-loss: Among individuals who took the highest does of Qsymia (15 mg phentermine and 92 mg of topiramate ER) for one-year, they achieved an average of 14.4 percent weight-loss. In a 200 pound person, a 14.4 percent weight-loss would mean a loss of 29 pounds. Cholesterol levels have also been shown to improve. Concerns: The most common side effects were dry mouth, constipation and pins-and-needle feeling in extremities. Qsymia should  NOT be taken during pregnancy since Topiramate ER, a component of Qsymia, has been associated with an increased risk of birth  defects.  Bupropion; Naltrexone ER (Contrave) How does it work? Works in two areas of your brain, hunger center and reward center to reduce hunger and cravings.  Weight loss In a 56 week trial patients lost more than 5% of their body weight.  Concerns Most common side effects are dry mouth, constipation or diarrhea, headache.  Please take it with a full glass of water and low fat meal.    Follow-up Visits: Patients are given the opportunity to revisit a topic or obtain more information on an area of interest during follow-up visits. The frequency of and interval between follow-up visits is determined on a patient-by-patient basis. Frequent visits (every 3 to 4 weeks) are encouraged until initial weight-loss goals (5 to 10 percent of body weight) are achieved. At that point, less frequent visits are typically scheduled as needed for individual patients. However, since obesity is considered a chronic life-long problem for many individuals, periodic continual follow up is recommended.   Research has shown that weight-loss as low as 5 percent of initial body weight can lead to favorable improvements in blood pressure, cholesterol, glucose levels and insulin sensitivity. The risk of developing heart disease is reduced the most in patients who have impaired glucose tolerance, type 2 diabetes or high blood pressure.     Simple math prevails.    1st - exercise does not produce significant weight loss - at best one converts fat into muscle , "bulks up", loses inches, but usually stays "weight neutral"     2nd - think of your body weightas a check book: If you eat more calories than you burn up - you save money or gain weight .... Or if you spend more money than you put in the check book, ie burn up more calories than you eat, then you lose weight     3rd - if you walk or run 1 mile, you burn up 100 calories - you have to burn up 3,500 calories to lose 1 pound, ie you have to walk/run 35 miles to  lose 1 measly pound. So if you want to lose 10 #, then you have to walk/run 350 miles, so.... clearly exercise is not the solution.     4. So if you consume 1,500 calories, then you have to burn up the equivalent of 15 miles to stay weight neutral - It also stands to reason that if you consume 1,500 cal/day and don't lose weight, then you must be burning up about 1,500 cals/day to stay weight neutral.     5. If you really want to lose weight, you must cut your calorie intake 300 calories /day and at that rate you should lose about 1 # every 3 days.   6. Please purchase Dr Fara Olden Fuhrman's book(s) "The End of Dieting" & "Eat to Live" . It has some great concepts and recipes.

## 2016-11-07 NOTE — Progress Notes (Addendum)
Complete Physical  Assessment and Plan:  Essential hypertension - continue medications, DASH diet, exercise and monitor at home. Call if greater than 130/80.  - CBC with Differential/Platelet - BASIC METABOLIC PANEL WITH GFR - Hepatic function panel - Urinalysis, Routine w reflex microscopic (not at [facility]) - Microalbumin / creatinine urine ratio - EKG 12-Lead - DG Chest 2 View; Future  Thyroid disease Hypothyroidism-check TSH level, continue medications the same, reminded to take on an empty stomach 30-15mins before food.  Had recent decrease in TSH and has had weight gain, will check  - TSH  Prediabetes Discussed general issues about diabetes pathophysiology and management., Educational material distributed., Suggested low cholesterol diet., Encouraged aerobic exercise., Discussed foot care., Reminded to get yearly retinal exam. - Hemoglobin A1c - Insulin, fasting   Hyperlipidemia -continue medications, check lipids, decrease fatty foods, increase activity.  - Lipid panel  Vitamin D deficiency - Vit D  25 hydroxy (rtn osteoporosis monitoring)  Medication management - Magnesium   Bilateral fibrocystic breast disease (FCBD) suggest getting 3D, CATEGORY D BREAST   Nonalcoholic fatty liver disease Check labs, avoid tylenol, alcohol, weight loss advised.  - Hepatic function panel  History of nephrolithiasis Increase water  Insomnia - ALPRAZolam (XANAX) 0.5 MG tablet; Take 1 tablet (0.5 mg total) by mouth 2 (two) times daily as needed for anxiety.  Dispense: 60 tablet; Refill: 0  Obesity, Class II, BMI 35-39.9, with comorbidity ([place]) Obesity with co morbidities- long discussion about weight loss, diet, and exercise - contrave printed, could not tolerate phentermine   Encounter for general adult medical examination with abnormal findings get MGM   Discussed med's effects and SE's. Screening labs and tests as requested with regular follow-up as  recommended. Addendum: Put in new labs, possible lab error, will repeat labs due to this, had critical magnesium- will repeat STAT   HPI 55 y.o. female  presents for a complete physical. She has had elevated blood pressure since 2000. Her blood pressure has been controlled at home, today their BP is BP: 128/74  Has had 3 sisters with carotid artery disease, have had stroke, 70's, upper 60's, smokers, she denies symptoms of dizziness, vision changes- explained that we will monitor and control risk factors but no need for further testing at this time. .  She does workout, walks 5 miles a day. She denies chest pain, shortness of breath, dizziness.  She is on cholesterol medication and denies myalgias. Her cholesterol is at goal. The cholesterol last visit was:   Lab Results  Component Value Date   CHOL 176 02/22/2016   HDL 39 (L) 02/22/2016   LDLCALC 105 02/22/2016   TRIG 162 (H) 02/22/2016   CHOLHDL 4.5 02/22/2016    She has been working on diet and exercise for prediabetes, and denies paresthesia of the feet, polydipsia and polyuria. Last A1C in the office was:  Lab Results  Component Value Date   HGBA1C 5.9 (H) 02/22/2016   Lab Results  Component Value Date   GFRNONAA >89 02/22/2016   Patient is on Vitamin D supplement, 10,000 every other day, and 5000 every other day   Lab Results  Component Value Date   VD25OH 118 (H) 02/22/2016     She is on thyroid medication. Her medication was changed last visit, she is on 1.5 on tues and 1 pill everyday.  Lab Results  Component Value Date   TSH 2.51 02/22/2016  .  Her BMI is Body mass index is 37.65 kg/m., she is working  on diet and exercise and has done well. She was on phentermine before but was unable to sleep while taking it, belviq was not covered.  Wt Readings from Last 3 Encounters:  11/07/16 222 lb 12.8 oz (101.1 kg)  02/22/16 202 lb 6.4 oz (91.8 kg)  10/31/15 199 lb (90.3 kg)   Has a history of kidney stones, follows with  Dr. Matilde Sprang.  Very rare trouble sleeping, takes xanax PRN if this happens.  Sister and mother passed in 10/20/14, stroke and sudden death due heart valve disease.   Current Medications:  Current Outpatient Prescriptions on File Prior to Visit  Medication Sig  . ALPRAZolam (XANAX) 0.5 MG tablet Take 1 tablet (0.5 mg total) by mouth 2 (two) times daily as needed for anxiety.  Marland Kitchen aspirin 81 MG tablet Take 81 mg by mouth daily.  . cholecalciferol (VITAMIN D) 1000 UNITS tablet Take 10,000 Units by mouth daily.   Marland Kitchen diltiazem (CARDIZEM CD) 240 MG 24 hr capsule TAKE 1 CAPSULE BY MOUTH  EVERY DAY FOR BLOOD  PRESSURE  . hydrochlorothiazide (HYDRODIURIL) 25 MG tablet Take 1 tablet by mouth  daily  . levothyroxine (SYNTHROID, LEVOTHROID) 100 MCG tablet TAKE 1 TABLET BY MOUTH  DAILY BEFORE BREAKFAST  . Multiple Vitamin (MULTIVITAMIN WITH MINERALS) TABS tablet Take 1 tablet by mouth daily.  . Omega-3 Fatty Acids (FISH OIL PO) Take 1 capsule by mouth daily.  . quinapril (ACCUPRIL) 40 MG tablet Take 1 tablet by mouth  daily   No current facility-administered medications on file prior to visit.     Health Maintenance:   Immunization History  Administered Date(s) Administered  . Pneumococcal-Unspecified 04/10/2010  . Td 11/03/2008   Tetanus: 2009 Pneumovax: 2011 Prevnar 13: age 46 Flu vaccine: declines Zostavax: N/A  Pap: 06/2014 normal, due 2018 MGM: 2016/10/20 suggest 3D, CAT D- DUE DEXA: N/A Colonoscopy:12/ 2016 Dr. Deatra Ina, 3 years EGD: N/A CXR: DUE  Medical History:  Past Medical History:  Diagnosis Date  . Allergy   . Anxiety   . Arthritis    hip  . Fatty liver disease, nonalcoholic 99991111   Via U/S  . GERD (gastroesophageal reflux disease)   . Hyperlipidemia   . Hypertension   . Insomnia   . Nephrolithiasis   . Prediabetes    no meds - diet controlled  . Thyroid disease   . Vitamin D deficiency    Allergies Allergies  Allergen Reactions  . Atenolol     Beta blockers.  Pt doesn't remember there being a problem  . Codeine Nausea Only  . Klonopin [Clonazepam] Rash  . Penicillins Rash    SURGICAL HISTORY She  has a past surgical history that includes Cholecystectomy; Tubal ligation; and Lithotripsy (2016). FAMILY HISTORY Her family history includes Heart Problems in her mother; Hypertension in her mother. SOCIAL HISTORY She  reports that she quit smoking about 23 years ago. She has a 10.00 pack-year smoking history. She has never used smokeless tobacco. She reports that she drinks about 1.2 oz of alcohol per week . She reports that she does not use drugs.  Review of Systems  Constitutional: Negative for chills, diaphoresis, fever, malaise/fatigue and weight loss.  HENT: Negative.   Eyes: Negative.   Respiratory: Negative.  Negative for cough and shortness of breath.   Cardiovascular: Negative.  Negative for chest pain.  Genitourinary: Negative.  Negative for dysuria, frequency and urgency.  Musculoskeletal: Positive for joint pain (right knee, has OA, has had shot before). Negative for back  pain, falls, myalgias and neck pain.  Skin: Negative.   Neurological: Negative.  Negative for dizziness and weakness.  Psychiatric/Behavioral: Negative for depression, hallucinations, memory loss, substance abuse and suicidal ideas. The patient has insomnia (occ will take xanax). The patient is not nervous/anxious.     Physical Exam: Estimated body mass index is 37.65 kg/m as calculated from the following:   Height as of this encounter: 5' 4.5" (1.638 m).   Weight as of this encounter: 222 lb 12.8 oz (101.1 kg). BP 128/74   Pulse 75   Temp 97.7 F (36.5 C)   Resp 16   Ht 5' 4.5" (1.638 m)   Wt 222 lb 12.8 oz (101.1 kg)   LMP 09/01/2013   SpO2 96%   BMI 37.65 kg/m  General Appearance: Well nourished, in no apparent distress. Eyes: PERRLA, EOMs, conjunctiva no swelling or erythema, normal fundi and vessels. Sinuses: No Frontal/maxillary  tenderness ENT/Mouth: Ext aud canals clear, normal light reflex with TMs without erythema, bulging.  Good dentition. No erythema, swelling, or exudate on post pharynx. Tonsils not swollen or erythematous. Hearing normal.  Neck: Supple, thyroid normal. No bruits Respiratory: Respiratory effort normal, BS equal bilaterally without rales, rhonchi, wheezing or stridor. Cardio: RRR without murmurs, rubs or gallops. Brisk peripheral pulses without edema.  Chest: symmetric, with normal excursions and percussion. Breasts: Symmetric lumps bilateral breast, without nipple discharge, retractions. Abdomen: Soft, +BS. Non tender, no guarding, rebound, hernias, masses, or organomegaly.  Lymphatics: Non tender without lymphadenopathy.  Genitourinary: defer, next year Musculoskeletal: Full ROM all peripheral extremities,5/5 strength, and normal gait. Skin: Warm, dry without rashes, lesions, ecchymosis.  Neuro: Cranial nerves intact, reflexes equal bilaterally. Normal muscle tone, no cerebellar symptoms. Sensation intact.  Psych: Awake and oriented X 3, normal affect, Insight and Judgment appropriate.   EKG: WNL no changes. AORTA SCAN:  WNL   Vicie Mutters 3:04 PM

## 2016-11-08 LAB — LIPID PANEL
CHOL/HDL RATIO: 5.8 ratio — AB (ref <5.0)
Cholesterol: 197 mg/dL (ref <200)
HDL: 34 mg/dL — AB (ref >50)
LDL CALC: 99 mg/dL (ref <100)
TRIGLYCERIDES: 322 mg/dL — AB (ref <150)
VLDL: 64 mg/dL — AB (ref <30)

## 2016-11-08 LAB — CBC WITH DIFFERENTIAL/PLATELET
BASOS ABS: 95 {cells}/uL (ref 0–200)
BASOS PCT: 1 %
EOS ABS: 380 {cells}/uL (ref 15–500)
EOS PCT: 4 %
HCT: 44.4 % (ref 35.0–45.0)
HEMOGLOBIN: 14.8 g/dL (ref 11.7–15.5)
LYMPHS ABS: 2660 {cells}/uL (ref 850–3900)
Lymphocytes Relative: 28 %
MCH: 30.1 pg (ref 27.0–33.0)
MCHC: 33.3 g/dL (ref 32.0–36.0)
MCV: 90.2 fL (ref 80.0–100.0)
MPV: 10 fL (ref 7.5–12.5)
Monocytes Absolute: 855 cells/uL (ref 200–950)
Monocytes Relative: 9 %
NEUTROS ABS: 5510 {cells}/uL (ref 1500–7800)
Neutrophils Relative %: 58 %
Platelets: 333 10*3/uL (ref 140–400)
RBC: 4.92 MIL/uL (ref 3.80–5.10)
RDW: 13.5 % (ref 11.0–15.0)
WBC: 9.5 10*3/uL (ref 3.8–10.8)

## 2016-11-08 LAB — BASIC METABOLIC PANEL WITH GFR
BUN: 12 mg/dL (ref 7–25)
CALCIUM: 9.9 mg/dL (ref 8.6–10.4)
CO2: 29 mmol/L (ref 20–31)
CREATININE: 0.76 mg/dL (ref 0.50–1.05)
Chloride: 99 mmol/L (ref 98–110)
GFR, Est African American: >89 mL/min (ref >=60)
GFR, Est Non African American: 87 mL/min (ref >=60)
GLUCOSE: 94 mg/dL (ref 65–99)
Potassium: 4.4 mmol/L (ref 3.5–5.3)
SODIUM: 138 mmol/L (ref 135–146)

## 2016-11-08 LAB — HEPATIC FUNCTION PANEL
ALT: 43 U/L — AB (ref 6–29)
AST: 79 U/L — ABNORMAL HIGH (ref 10–35)
Albumin: 4.3 g/dL (ref 3.6–5.1)
Alkaline Phosphatase: 91 U/L (ref 33–130)
BILIRUBIN DIRECT: 0.1 mg/dL (ref <=0.2)
Indirect Bilirubin: 0.3 mg/dL (ref 0.2–1.2)
TOTAL PROTEIN: 7.5 g/dL (ref 6.1–8.1)
Total Bilirubin: 0.4 mg/dL (ref 0.2–1.2)

## 2016-11-08 LAB — URINALYSIS, ROUTINE W REFLEX MICROSCOPIC
Bilirubin Urine: NEGATIVE
GLUCOSE, UA: NEGATIVE
Hgb urine dipstick: NEGATIVE
Ketones, ur: NEGATIVE
LEUKOCYTES UA: NEGATIVE
Nitrite: NEGATIVE
PH: 7 (ref 5.0–8.0)
Protein, ur: NEGATIVE
Specific Gravity, Urine: 1.008 (ref 1.001–1.035)

## 2016-11-08 LAB — MICROALBUMIN / CREATININE URINE RATIO
Creatinine, Urine: 36 mg/dL (ref 20–320)
Microalb, Ur: <0.2 mg/dL

## 2016-11-08 LAB — TSH: TSH: 10.33 mIU/L — ABNORMAL HIGH

## 2016-11-08 LAB — HEMOGLOBIN A1C
HEMOGLOBIN A1C: 5.8 % — AB (ref <5.7)
Mean Plasma Glucose: 120 mg/dL

## 2016-11-08 LAB — MAGNESIUM: Magnesium: 2 mg/dL (ref 1.5–2.5)

## 2016-11-08 NOTE — Addendum Note (Signed)
Addended by: Vicie Mutters R on: 11/08/2016 01:41 PM   Modules accepted: Orders

## 2016-11-08 NOTE — Addendum Note (Signed)
Addended by: Maeola Sarah on: 11/08/2016 02:21 PM   Modules accepted: Orders

## 2016-11-09 ENCOUNTER — Other Ambulatory Visit: Payer: Self-pay

## 2016-11-09 ENCOUNTER — Other Ambulatory Visit: Payer: Self-pay | Admitting: Physician Assistant

## 2016-11-09 DIAGNOSIS — E079 Disorder of thyroid, unspecified: Secondary | ICD-10-CM

## 2016-11-09 DIAGNOSIS — K760 Fatty (change of) liver, not elsewhere classified: Secondary | ICD-10-CM

## 2016-11-14 LAB — TSH

## 2016-11-14 LAB — VITAMIN D 25 HYDROXY (VIT D DEFICIENCY, FRACTURES)

## 2016-11-14 LAB — MAGNESIUM

## 2016-11-15 ENCOUNTER — Encounter: Payer: Self-pay | Admitting: *Deleted

## 2016-11-17 ENCOUNTER — Other Ambulatory Visit: Payer: Self-pay | Admitting: Physician Assistant

## 2016-11-20 ENCOUNTER — Other Ambulatory Visit: Payer: Self-pay | Admitting: Physician Assistant

## 2016-11-20 DIAGNOSIS — G4700 Insomnia, unspecified: Secondary | ICD-10-CM

## 2016-11-20 MED ORDER — ALPRAZOLAM 0.5 MG PO TABS: 0.5000 mg | ORAL_TABLET | Freq: Two times a day (BID) | ORAL | 0 refills | Status: DC | PRN

## 2016-11-29 ENCOUNTER — Other Ambulatory Visit: Payer: Self-pay | Admitting: Physician Assistant

## 2016-11-29 ENCOUNTER — Telehealth: Payer: Self-pay | Admitting: Physician Assistant

## 2016-11-29 MED ORDER — PREDNISONE 20 MG PO TABS: ORAL_TABLET | ORAL | 0 refills | Status: DC

## 2016-11-29 NOTE — Telephone Encounter (Signed)
Patient is on ACE and was recently started on levothyroxine. States has been having lip peeling/swelling rash on leg. Suggest stopping thyroid medication and lisinopril for now, need follow up in the office.  If any trouble swallowing, drooling, trouble breathing, need to go to the ER  Lab Results  Component Value Date   TSH 10.33 (H) 11/08/2016

## 2016-11-29 NOTE — Telephone Encounter (Signed)
Can take benadryl/zyrtec and prednisone sent in

## 2016-12-03 ENCOUNTER — Ambulatory Visit: Payer: Self-pay | Admitting: Physician Assistant

## 2016-12-04 ENCOUNTER — Ambulatory Visit (INDEPENDENT_AMBULATORY_CARE_PROVIDER_SITE_OTHER): Payer: 59 | Admitting: Physician Assistant

## 2016-12-04 ENCOUNTER — Encounter: Payer: Self-pay | Admitting: Physician Assistant

## 2016-12-04 VITALS — BP 124/88 | HR 74 | Temp 97.9°F | Resp 16 | Ht 64.5 in | Wt 222.0 lb

## 2016-12-04 DIAGNOSIS — E079 Disorder of thyroid, unspecified: Secondary | ICD-10-CM | POA: Diagnosis not present

## 2016-12-04 DIAGNOSIS — R21 Rash and other nonspecific skin eruption: Secondary | ICD-10-CM

## 2016-12-04 NOTE — Patient Instructions (Signed)
Drug Rash Introduction A drug rash is a change in the color or texture of the skin that is caused by a drug. It can develop minutes, hours, or days after the person takes the drug. What are the causes? This condition is usually caused by a drug allergy. It can also be caused by exposure to sunlight after taking a drug that makes the skin sensitive to light. Drugs that commonly cause rashes include:  Penicillin.  Antibiotic medicines.  Medicines that treat seizures.  Medicines that treat cancer (chemotherapy).  Aspirin and other nonsteroidal anti-inflammatory drugs (NSAIDs).  Injectable dyes that contain iodine.  Insulin. What are the signs or symptoms? Symptoms of this condition include:  Redness.  Tiny bumps.  Peeling.  Itching.  Itchy welts (hives).  Swelling. The rash may appear on a small area of skin or all over the body. How is this diagnosed? To diagnose the condition, your health care provider will do a physical exam. He or she may also order tests to find out which drug caused the rash. Tests to find the cause of a rash include:  Skin tests.  Blood tests.  Drug challenge. For this test, you stop taking all of the drugs that you do not need to take, and then you start taking them again by adding back one of the drugs at a time. How is this treated? A drug rash may be treated with medicines, including:  Antihistamines. These may be given to relieve itching.  An NSAID. This may be given to reduce swelling and treat pain.  A steroid drug. This may be given to reduce swelling. The rash usually goes away when the person stops taking the drug that caused it. Follow these instructions at home:  Take medicines only as directed by your health care provider.  Let all of your health care providers know about any drug reactions you have had in the past.  If you have hives, take a cool shower or use a cool compress to relieve itchiness. Contact a health care  provider if:  You have a fever.  Your rash is not going away.  Your rash gets worse.  Your rash comes back.  You have wheezing or coughing. Get help right away if:  You start to have breathing problems.  You start to have shortness of breath.  You face or throat starts to swell.  You have severe weakness with dizziness or fainting.  You have chest pain. This information is not intended to replace advice given to you by your health care provider. Make sure you discuss any questions you have with your health care provider. Document Released: 12/13/2004 Document Revised: 04/12/2016 Document Reviewed: 09/01/2014  2017 Elsevier

## 2016-12-04 NOTE — Progress Notes (Signed)
   Subjective:    Patient ID: Shannon Pope, female    DOB: 24-Aug-1961, 56 y.o.   MRN: [id number]  HPI 56 y.o. WF presents for rash. She has been on ACE and on thyroid generic medication presents with rash. She states with the increase in thyroid medication that she started to have a rash/swelling/peeling on her lips and rash on bilateral legs. She did not take any medications like benadryl, stopped all of her lip balms/makeup. She related the dose increase of her thyroid medication with the swelling, stopped and states that she is doing better.   Lab Results  Component Value Date   TSH 10.33 (H) 11/08/2016   BMI is Body mass index is 37.52 kg/m., she is working on diet and exercise. Wt Readings from Last 3 Encounters:  12/04/16 222 lb (100.7 kg)  11/07/16 222 lb 12.8 oz (101.1 kg)  02/22/16 202 lb 6.4 oz (91.8 kg)    Review of Systems  Constitutional: Negative.   HENT: Negative.   Respiratory: Negative.   Cardiovascular: Negative.   Gastrointestinal: Negative.   Genitourinary: Negative.   Musculoskeletal: Negative.   Skin: Positive for rash.  Neurological: Negative.   Hematological: Negative.   Psychiatric/Behavioral: Negative.        Objective:   Physical Exam  Constitutional: She is oriented to person, place, and time. She appears well-developed and well-nourished.  HENT:  Head: Normocephalic and atraumatic.  Right Ear: External ear normal.  Left Ear: External ear normal.  Mouth/Throat: Oropharynx is clear and moist. No trismus in the jaw. No uvula swelling. No posterior oropharyngeal edema.  Upper lip with minor erythema and peeling, no visible rash, swelling at this time.   Eyes: Conjunctivae and EOM are normal. Pupils are equal, round, and reactive to light.  Neck: Normal range of motion. Neck supple. No thyromegaly present.  Cardiovascular: Normal rate, regular rhythm and normal heart sounds.  Exam reveals no gallop and no friction rub.   No murmur  heard. Pulmonary/Chest: Effort normal and breath sounds normal. No respiratory distress. She has no wheezes.  Abdominal: Soft. Bowel sounds are normal. She exhibits no distension and no mass. There is no tenderness. There is no rebound and no guarding.  Musculoskeletal: Normal range of motion.  Lymphadenopathy:    She has no cervical adenopathy.  Neurological: She is alert and oriented to person, place, and time. She displays normal reflexes. No cranial nerve deficit. Coordination normal.  Skin: Skin is warm and dry.  Psychiatric: She has a normal mood and affect.       Assessment & Plan:  Rash ? hypersensitivity reaction to generic thyroid, new manufacture and correlated with new med/increase of medication, does not appear to be from [place] Will give brand name synthroid only Samples given will do 150 daily but 147mcg on Sunday 2 week samples Need 2 week labs for thyroid If any further lip swelling, swelling of tongue, throat, trouble with swallowing, SOB go to ER, patient understands  Future Appointments Date Time Provider [place]  02/05/2017 3:00 PM Vicie Mutters, PA-C [facility] None  11/20/2017 3:00 PM Vicie Mutters, PA-C [facility] None

## 2016-12-18 ENCOUNTER — Encounter: Payer: Self-pay | Admitting: Physician Assistant

## 2016-12-18 ENCOUNTER — Other Ambulatory Visit: Payer: 59

## 2016-12-18 DIAGNOSIS — E079 Disorder of thyroid, unspecified: Secondary | ICD-10-CM

## 2016-12-18 DIAGNOSIS — K760 Fatty (change of) liver, not elsewhere classified: Secondary | ICD-10-CM

## 2016-12-18 LAB — TSH: TSH: 4.44 mIU/L

## 2016-12-19 LAB — HEPATIC FUNCTION PANEL
ALK PHOS: 91 U/L (ref 33–130)
ALT: 38 U/L — AB (ref 6–29)
AST: 53 U/L — ABNORMAL HIGH (ref 10–35)
Albumin: 4.3 g/dL (ref 3.6–5.1)
Bilirubin, Direct: 0.1 mg/dL (ref <=0.2)
Indirect Bilirubin: 0.4 mg/dL (ref 0.2–1.2)
TOTAL PROTEIN: 7.5 g/dL (ref 6.1–8.1)
Total Bilirubin: 0.5 mg/dL (ref 0.2–1.2)

## 2016-12-19 NOTE — Progress Notes (Unsigned)
Future Appointments Date Time Provider [place]  02/05/2017 3:00 PM Vicie Mutters, PA-C [facility] None  11/20/2017 3:00 PM Vicie Mutters, PA-C [facility] None

## 2017-01-02 ENCOUNTER — Encounter: Payer: Self-pay | Admitting: Physician Assistant

## 2017-01-02 MED ORDER — LEVOTHYROXINE SODIUM 100 MCG PO TABS: ORAL_TABLET | ORAL | 1 refills | Status: DC

## 2017-01-14 ENCOUNTER — Encounter: Payer: Self-pay | Admitting: Physician Assistant

## 2017-01-14 ENCOUNTER — Other Ambulatory Visit: Payer: Self-pay | Admitting: Physician Assistant

## 2017-01-14 MED ORDER — LEVOTHYROXINE SODIUM 150 MCG PO TABS: ORAL_TABLET | ORAL | 1 refills | Status: DC

## 2017-02-05 ENCOUNTER — Ambulatory Visit: Payer: Self-pay | Admitting: Physician Assistant

## 2017-02-12 ENCOUNTER — Encounter: Payer: Self-pay | Admitting: Physician Assistant

## 2017-02-12 ENCOUNTER — Ambulatory Visit (INDEPENDENT_AMBULATORY_CARE_PROVIDER_SITE_OTHER): Payer: 59 | Admitting: Physician Assistant

## 2017-02-12 VITALS — BP 124/82 | HR 86 | Temp 97.3°F | Resp 16 | Ht 64.5 in | Wt 228.0 lb

## 2017-02-12 DIAGNOSIS — Z79899 Other long term (current) drug therapy: Secondary | ICD-10-CM

## 2017-02-12 DIAGNOSIS — E785 Hyperlipidemia, unspecified: Secondary | ICD-10-CM

## 2017-02-12 DIAGNOSIS — R7303 Prediabetes: Secondary | ICD-10-CM | POA: Diagnosis not present

## 2017-02-12 DIAGNOSIS — E079 Disorder of thyroid, unspecified: Secondary | ICD-10-CM

## 2017-02-12 DIAGNOSIS — I10 Essential (primary) hypertension: Secondary | ICD-10-CM | POA: Diagnosis not present

## 2017-02-12 DIAGNOSIS — K760 Fatty (change of) liver, not elsewhere classified: Secondary | ICD-10-CM

## 2017-02-12 DIAGNOSIS — E669 Obesity, unspecified: Secondary | ICD-10-CM | POA: Diagnosis not present

## 2017-02-12 DIAGNOSIS — IMO0001 Reserved for inherently not codable concepts without codable children: Secondary | ICD-10-CM

## 2017-02-12 LAB — CBC WITH DIFFERENTIAL/PLATELET
BASOS PCT: 1 %
Basophils Absolute: 112 cells/uL (ref 0–200)
Eosinophils Absolute: 448 cells/uL (ref 15–500)
Eosinophils Relative: 4 %
HCT: 45 % (ref 35.0–45.0)
HEMOGLOBIN: 15.1 g/dL (ref 11.7–15.5)
LYMPHS ABS: 2912 {cells}/uL (ref 850–3900)
Lymphocytes Relative: 26 %
MCH: 30.6 pg (ref 27.0–33.0)
MCHC: 33.6 g/dL (ref 32.0–36.0)
MCV: 91.3 fL (ref 80.0–100.0)
MONOS PCT: 7 %
MPV: 10.2 fL (ref 7.5–12.5)
Monocytes Absolute: 784 cells/uL (ref 200–950)
NEUTROS ABS: 6944 {cells}/uL (ref 1500–7800)
Neutrophils Relative %: 62 %
PLATELETS: 355 10*3/uL (ref 140–400)
RBC: 4.93 MIL/uL (ref 3.80–5.10)
RDW: 13.9 % (ref 11.0–15.0)
WBC: 11.2 10*3/uL — ABNORMAL HIGH (ref 3.8–10.8)

## 2017-02-12 LAB — BASIC METABOLIC PANEL WITH GFR
BUN: 12 mg/dL (ref 7–25)
CHLORIDE: 97 mmol/L — AB (ref 98–110)
CO2: 21 mmol/L (ref 20–31)
CREATININE: 0.72 mg/dL (ref 0.50–1.05)
Calcium: 10.5 mg/dL — ABNORMAL HIGH (ref 8.6–10.4)
GFR, Est African American: >89 mL/min (ref >=60)
Glucose, Bld: 93 mg/dL (ref 65–99)
Potassium: 3.5 mmol/L (ref 3.5–5.3)
SODIUM: 132 mmol/L — AB (ref 135–146)

## 2017-02-12 LAB — TSH: TSH: 7.4 m[IU]/L — AB

## 2017-02-12 LAB — HEPATIC FUNCTION PANEL
ALT: 45 U/L — ABNORMAL HIGH (ref 6–29)
AST: 79 U/L — AB (ref 10–35)
Albumin: 4.3 g/dL (ref 3.6–5.1)
Alkaline Phosphatase: 98 U/L (ref 33–130)
BILIRUBIN DIRECT: 0.1 mg/dL (ref <=0.2)
BILIRUBIN INDIRECT: 0.4 mg/dL (ref 0.2–1.2)
Total Bilirubin: 0.5 mg/dL (ref 0.2–1.2)
Total Protein: 7.6 g/dL (ref 6.1–8.1)

## 2017-02-12 LAB — LIPID PANEL
CHOL/HDL RATIO: 6.1 ratio — AB (ref <5.0)
CHOLESTEROL: 201 mg/dL — AB (ref <200)
HDL: 33 mg/dL — ABNORMAL LOW (ref >50)
LDL Cholesterol: 118 mg/dL — ABNORMAL HIGH (ref <100)
Triglycerides: 249 mg/dL — ABNORMAL HIGH (ref <150)
VLDL: 50 mg/dL — ABNORMAL HIGH (ref <30)

## 2017-02-12 MED ORDER — LEVOTHYROXINE SODIUM 150 MCG PO TABS: ORAL_TABLET | ORAL | 1 refills | Status: DC

## 2017-02-12 NOTE — Progress Notes (Signed)
Assessment and Plan:   Essential hypertension - continue medications, DASH diet, exercise and monitor at home. Call if greater than 130/80.  -     CBC with Differential/Platelet -     BASIC METABOLIC PANEL WITH GFR -     Hepatic function panel  Nonalcoholic fatty liver disease Check labs, avoid tylenol, alcohol, weight loss advised.  -     Hepatic function panel  Thyroid disease Hypothyroidism-check TSH level, continue medications the same, reminded to take on an empty stomach 30-23mins before food.  GOAL IS AROUND 2, MAY HAVE TO ADJUST -     levothyroxine (SYNTHROID, LEVOTHROID) 150 MCG tablet; TAKE 1 TABLET BY MOUTH  DAILY BEFORE BREAKFAST, BRAND NAME ONLY, HX OF ANGEIOEDEMA TO GENERIC -     TSH  Hyperlipidemia, unspecified hyperlipidemia type -continue medications, check lipids, decrease fatty foods, increase activity.  -     Hemoglobin A1c  Medication management -     Magnesium  Obesity, Class II, BMI 35-39.9, with comorbidity ([facility]) Start on contrave, diet discussed -     Lipid panel -     Hemoglobin A1c  Prediabetes Discussed general issues about diabetes pathophysiology and management., Educational material distributed., Suggested low cholesterol diet., Encouraged aerobic exercise., Discussed foot care., Reminded to get yearly retinal exam. -     Hemoglobin A1c   Continue diet and meds as discussed. Further disposition pending results of labs. Future Appointments Date Time Provider [place]  11/20/2017 3:00 PM Vicie Mutters, PA-C [facility] None    HPI 56 y.o. female  presents for 3 month follow up with hypertension, hyperlipidemia, prediabetes and vitamin D. Her blood pressure has been controlled at home, today their BP is BP: 124/82 She does workout, walks daily, getting 10,000 steps.  She denies chest pain, shortness of breath, dizziness.  She is on cholesterol medication and denies myalgias. Her cholesterol is not at goal. The cholesterol last visit was:    Lab Results  Component Value Date   CHOL 197 11/08/2016   HDL 34 (L) 11/08/2016   LDLCALC 99 11/08/2016   TRIG 322 (H) 11/08/2016   CHOLHDL 5.8 (H) 11/08/2016   She has been working on diet and exercise for prediabetes, and denies paresthesia of the feet, polydipsia, polyuria and visual disturbances. Last A1C in the office was:  Lab Results  Component Value Date   HGBA1C 5.8 (H) 11/08/2016   BMI is Body mass index is 38.53 kg/m., she is working on diet and exercise. Phentermine did not help, has [employer] but has not started it yet.   Wt Readings from Last 3 Encounters:  02/12/17 228 lb (103.4 kg)  12/04/16 222 lb (100.7 kg)  11/07/16 222 lb 12.8 oz (101.1 kg)  She is on thyroid medication, on 1 pill daily of 140mcg.  Her medication was not changed last visit. She is on brand name and doing well.  Lab Results  Component Value Date   TSH 4.44 12/18/2016  .   Current Medications:  Current Outpatient Prescriptions on File Prior to Visit  Medication Sig Dispense Refill  . ALPRAZolam (XANAX) 0.5 MG tablet Take 1 tablet (0.5 mg total) by mouth 2 (two) times daily as needed for anxiety. 180 tablet 0  . aspirin 81 MG tablet Take 81 mg by mouth daily.    . cholecalciferol (VITAMIN D) 1000 UNITS tablet Take 10,000 Units by mouth daily.     Marland Kitchen diltiazem (CARDIZEM CD) 240 MG 24 hr capsule TAKE 1 CAPSULE BY MOUTH  EVERY DAY FOR BLOOD  PRESSURE 90 capsule 1  . hydrochlorothiazide (HYDRODIURIL) 25 MG tablet TAKE 1 TABLET BY MOUTH  DAILY 90 tablet 1  . Multiple Vitamin (MULTIVITAMIN WITH MINERALS) TABS tablet Take 1 tablet by mouth daily.    . Naltrexone-Bupropion HCl ER (CONTRAVE) 8-90 MG TB12 WK 1: take 1 tablet daily, WK 2: 1 tablet BId, WK 3: 2 tablets Q AM , 1 tablet Q PM, WK4: 2 tablets BID, Maintenance dose 2 tablets BID. 120 tablet 3  . Omega-3 Fatty Acids (FISH OIL PO) Take 1 capsule by mouth daily.    . quinapril (ACCUPRIL) 40 MG tablet TAKE 1 TABLET BY MOUTH  DAILY 90 tablet 1    No current facility-administered medications on file prior to visit.    Medical History:  Past Medical History:  Diagnosis Date  . Allergy   . Anxiety   . Arthritis    hip  . Fatty liver disease, nonalcoholic 68/3419   Via U/S  . GERD (gastroesophageal reflux disease)   . Hyperlipidemia   . Hypertension   . Insomnia   . Nephrolithiasis   . Prediabetes    no meds - diet controlled  . Thyroid disease   . Vitamin D deficiency    Allergies:  Allergies  Allergen Reactions  . Atenolol     Beta blockers. Pt doesn't remember there being a problem  . Codeine Nausea Only  . Klonopin [Clonazepam] Rash  . Penicillins Rash    Review of Systems:  Review of Systems  Constitutional: Positive for malaise/fatigue. Negative for chills, diaphoresis, fever and weight loss.  HENT: Negative for congestion, ear discharge, ear pain, hearing loss, nosebleeds, sore throat and tinnitus.   Eyes: Negative.   Respiratory: Negative for cough, hemoptysis, sputum production, shortness of breath, wheezing and stridor.   Cardiovascular: Negative.   Gastrointestinal: Negative.   Genitourinary: Negative.   Musculoskeletal: Negative.   Skin: Negative.   Neurological: Negative for dizziness, tingling, tremors, sensory change, speech change, focal weakness, seizures, loss of consciousness, weakness and headaches.  Psychiatric/Behavioral: Positive for depression. Negative for hallucinations, memory loss, substance abuse and suicidal ideas. The patient is not nervous/anxious and does not have insomnia.     Family history- Review and unchanged Social history- Review and unchanged Physical Exam: BP 124/82   Pulse 86   Temp 97.3 F (36.3 C)   Resp 16   Ht 5' 4.5" (1.638 m)   Wt 228 lb (103.4 kg)   LMP 09/01/2013   SpO2 96%   BMI 38.53 kg/m  Wt Readings from Last 3 Encounters:  02/12/17 228 lb (103.4 kg)  12/04/16 222 lb (100.7 kg)  11/07/16 222 lb 12.8 oz (101.1 kg)   General Appearance: Well  nourished, in no apparent distress. Eyes: PERRLA, EOMs, conjunctiva no swelling or erythema Sinuses: + Frontal/maxillary tenderness ENT/Mouth: Ext aud canals clear, TMs without erythema, bulging. No erythema, swelling, or exudate on post pharynx.  Tonsils not swollen or erythematous. Hearing normal.  Neck: Supple, thyroid normal.  Respiratory: Respiratory effort normal, BS equal bilaterally without rales, rhonchi, wheezing or stridor.  Cardio: RRR with no MRGs. Brisk peripheral pulses without edema.  Abdomen: Soft, + BS.  Non tender, no guarding, rebound, hernias, masses. Lymphatics: Non tender without lymphadenopathy.  Musculoskeletal: Full ROM, 5/5 strength, normal gait.  Skin: Warm, dry without rashes, lesions, ecchymosis.  Neuro: Cranial nerves intact. Normal muscle tone, no cerebellar symptoms. Sensation intact.  Psych: Awake and oriented X 3, normal affect, Insight and Judgment  appropriate.    Vicie Mutters, PA-C 2:56 PM [facility]

## 2017-02-12 NOTE — Patient Instructions (Addendum)
[place], [place] Can get brand name cheap from here   Simple math prevails.    1st - exercise does not produce significant weight loss - at best one converts fat into muscle , "bulks up", loses inches, but usually stays "weight neutral"     2nd - think of your body weightas a check book: If you eat more calories than you burn up - you save money or gain weight .... Or if you spend more money than you put in the check book, ie burn up more calories than you eat, then you lose weight     3rd - if you walk or run 1 mile, you burn up 100 calories - you have to burn up 3,500 calories to lose 1 pound, ie you have to walk/run 35 miles to lose 1 measly pound. So if you want to lose 10 #, then you have to walk/run 350 miles, so.... clearly exercise is not the solution.     4. So if you consume 1,500 calories, then you have to burn up the equivalent of 15 miles to stay weight neutral - It also stands to reason that if you consume 1,500 cal/day and don't lose weight, then you must be burning up about 1,500 cals/day to stay weight neutral.     5. If you really want to lose weight, you must cut your calorie intake 300 calories /day and at that rate you should lose about 1 # every 3 days.   6. Please purchase Dr Fara Olden Fuhrman's book(s) "The End of Dieting" & "Eat to Live" . It has some great concepts and recipes.      Who Qualifies for Obesity Medications? Although everyone is hopeful for a fast and easy way to lose weight, nothing has been shown to replace a prudent, calorie-controlled diet along with behavior modification as a cornerstone for all obesity treatments.  The next tool that can be used to achieve weight-loss and health improvement is medication. Pharmacotherapy may be offered to individuals affected by obesity who have failed to achieve weight-loss through diet and exercise alone. Currently there are several drugs that are approved by the  FDA for weight-loss: phentermine products (Adipex-P or Suprenza)  lorcaserin HCI (Belviq) phentermine- topiramate ER (Qsymia)  Bupropion; Naltrexone ER (Contrave)  Let's take a closer look at each of these medications and learn how they work:  Phentermine (Adipex-P or Suprenza) How does it work? Phentermine is a medication available by prescription that works on chemicals in the brain to decrease your appetite. It also has a mild stimulant component that adds extra energy. Phentermine is a pill that is taken once a day in the morning time. Tolerance to this medication can develop, so it can only be used for several months at a time. Common side effects are dry mouth, sleeplessness, constipation. Weight-loss: The average weight-loss is 4-5 percent of your weight after one-year. In a 200 pound person, this means about 10 pounds of weight-loss. Patients who receive phentermine can usually expect to see greater weight-loss than those who receive non-pharmacologic care, on average about 13 pounds difference over 12 weeks as reported in one study. Concerns: Due to its stimulant effect, a person's blood pressure and heart rate may increase when on this medication; therefore, you must be monitored closely by a physician who is experienced in prescribing this medication. It cannot be used in patients with some heart conditions (such as poorly controlled blood pressure), glaucoma (increased pressure  in your eye), stroke or overactive thyroid. There is some concern for abuse, but this is minimal if the medication is appropriately used as directed by a healthcare professional.  Lorcaserin (Belviq) How does it work? Lorcaserin was approved in June 2012 by the FDA and became commercially available in June 2013. It works by helping you feel full while eating less, and it works on the chemicals in your brain to help decrease your appetite. Weight-loss: In individuals who took the medication for one-year, it  has been shown to have an average of 7 percent weight-loss. In a 200 pound person, this would mean a 14 pound weight-loss. Blood sugar, cholesterol and blood pressure levels have also been shown to improve. Concerns: The most common side effects are headache, dizziness, fatigue, dry mouth, upper WHQ:PRFFMBWG tract infection and nausea.  Response to therapy should be evaluated by week 12.  If a patient has not lost at least 5% of baseline body weigh  Phentermine-Topiramate ER (Qsymia) How does it work? This combination medication was approved by the FDA in July 2012. Topiramate is a medication used to treat seizures. It was found that a common side effect of this medication was weight-loss. Phentermine, as described in this brochure, helps to increase your energy and decrease your appetite. Weight-loss: Among individuals who took the highest does of Qsymia (15 mg phentermine and 92 mg of topiramate ER) for one-year, they achieved an average of 14.4 percent weight-loss. In a 200 pound person, a 14.4 percent weight-loss would mean a loss of 29 pounds. Cholesterol levels have also been shown to improve. Concerns: The most common side effects were dry mouth, constipation and pins-and-needle feeling in extremities. Qsymia should NOT be taken during pregnancy since Topiramate ER, a component of Qsymia, has been associated with an increased risk of birth defects.  Bupropion; Naltrexone ER (Contrave) How does it work? Works in two areas of your brain, hunger center and reward center to reduce hunger and cravings.  Weight loss In a 56 week trial patients lost more than 5% of their body weight.  Concerns Most common side effects are dry mouth, constipation or diarrhea, headache.  Please take it with a full glass of water and low fat meal.    Follow-up Visits: Patients are given the opportunity to revisit a topic or obtain more information on an area of interest during follow-up visits. The frequency  of and interval between follow-up visits is determined on a patient-by-patient basis. Frequent visits (every 3 to 4 weeks) are encouraged until initial weight-loss goals (5 to 10 percent of body weight) are achieved. At that point, less frequent visits are typically scheduled as needed for individual patients. However, since obesity is considered a chronic life-long problem for many individuals, periodic continual follow up is recommended.   Research has shown that weight-loss as low as 5 percent of initial body weight can lead to favorable improvements in blood pressure, cholesterol, glucose levels and insulin sensitivity. The risk of developing heart disease is reduced the most in patients who have impaired glucose tolerance, type 2 diabetes or high blood pressure.

## 2017-02-13 ENCOUNTER — Other Ambulatory Visit: Payer: Self-pay | Admitting: Physician Assistant

## 2017-02-13 DIAGNOSIS — E039 Hypothyroidism, unspecified: Secondary | ICD-10-CM

## 2017-02-13 DIAGNOSIS — E079 Disorder of thyroid, unspecified: Secondary | ICD-10-CM

## 2017-02-13 LAB — MAGNESIUM: MAGNESIUM: 2 mg/dL (ref 1.5–2.5)

## 2017-02-13 LAB — HEMOGLOBIN A1C
Hgb A1c MFr Bld: 5.9 % — ABNORMAL HIGH (ref <5.7)
MEAN PLASMA GLUCOSE: 123 mg/dL

## 2017-02-13 MED ORDER — LEVOTHYROXINE SODIUM 175 MCG PO TABS: ORAL_TABLET | ORAL | 0 refills | Status: DC

## 2017-03-14 ENCOUNTER — Other Ambulatory Visit: Payer: 59

## 2017-03-14 DIAGNOSIS — E039 Hypothyroidism, unspecified: Secondary | ICD-10-CM

## 2017-03-15 ENCOUNTER — Encounter: Payer: Self-pay | Admitting: Physician Assistant

## 2017-03-15 ENCOUNTER — Other Ambulatory Visit: Payer: Self-pay | Admitting: Physician Assistant

## 2017-03-15 DIAGNOSIS — E079 Disorder of thyroid, unspecified: Secondary | ICD-10-CM

## 2017-03-15 LAB — TSH: TSH: 4.45 mIU/L

## 2017-03-15 MED ORDER — LEVOTHYROXINE SODIUM 175 MCG PO TABS: ORAL_TABLET | ORAL | 0 refills | Status: DC

## 2017-03-15 NOTE — Progress Notes (Unsigned)
Future Appointments Date Time Provider [place]  06/03/2017 2:30 PM Vicie Mutters, PA-C [facility] None  11/20/2017 3:00 PM Vicie Mutters, PA-C [facility] None

## 2017-04-16 ENCOUNTER — Other Ambulatory Visit: Payer: 59

## 2017-04-16 DIAGNOSIS — E079 Disorder of thyroid, unspecified: Secondary | ICD-10-CM

## 2017-04-16 LAB — TSH: TSH: 4.74 mIU/L — ABNORMAL HIGH

## 2017-04-26 ENCOUNTER — Other Ambulatory Visit: Payer: Self-pay | Admitting: Physician Assistant

## 2017-04-26 ENCOUNTER — Other Ambulatory Visit: Payer: Self-pay

## 2017-04-26 DIAGNOSIS — G4700 Insomnia, unspecified: Secondary | ICD-10-CM

## 2017-04-26 MED ORDER — ALPRAZOLAM 0.5 MG PO TABS: 0.5000 mg | ORAL_TABLET | Freq: Two times a day (BID) | ORAL | 0 refills | Status: DC | PRN

## 2017-05-22 ENCOUNTER — Other Ambulatory Visit: Payer: Self-pay | Admitting: Physician Assistant

## 2017-06-02 NOTE — Progress Notes (Signed)
Assessment and Plan:   Essential hypertension - continue medications, DASH diet, exercise and monitor at home. Call if greater than 130/80.  -     CBC with Differential/Platelet -     BASIC METABOLIC PANEL WITH GFR -     Hepatic function panel  Nonalcoholic fatty liver disease Check labs, avoid tylenol, alcohol, weight loss advised.  -     Hepatic function panel  Thyroid disease Hypothyroidism-check TSH level, continue medications the same, reminded to take on an empty stomach 30-18mins before food.  GOAL IS AROUND 2, MAY HAVE TO ADJUST -     levothyroxine (SYNTHROID, LEVOTHROID) 150 MCG tablet; TAKE 1 TABLET BY MOUTH  DAILY BEFORE BREAKFAST, BRAND NAME ONLY, HX OF ANGEIOEDEMA TO GENERIC -     TSH -  If normal will send to mail order  Hyperlipidemia, unspecified hyperlipidemia type -continue medications, check lipids, decrease fatty foods, increase activity.  -     Hemoglobin A1c  Medication management -     Magnesium  Obesity, Class II, BMI 35-39.9, with comorbidity (HCC) Start on contrave, diet discussed -     Lipid panel -     Hemoglobin A1c  Prediabetes Discussed general issues about diabetes pathophysiology and management., Educational material distributed., Suggested low cholesterol diet., Encouraged aerobic exercise., Discussed foot care., Reminded to get yearly retinal exam. -     Hemoglobin A1c   Continue diet and meds as discussed. Further disposition pending results of labs. Future Appointments Date Time Provider [place]  11/20/2017 3:00 PM Vicie Mutters, PA-C [facility] None    HPI 56 y.o. female  presents for 3 month follow up with hypertension, hyperlipidemia, prediabetes and vitamin D. Her blood pressure has been controlled at home, today their BP is BP: 136/78 She does workout, walks daily, getting 10,000 steps.  She denies chest pain, shortness of breath, dizziness.  She is on cholesterol medication and denies myalgias. Her cholesterol is not  at goal. The cholesterol last visit was:   Lab Results  Component Value Date   CHOL 201 (H) 02/12/2017   HDL 33 (L) 02/12/2017   LDLCALC 118 (H) 02/12/2017   TRIG 249 (H) 02/12/2017   CHOLHDL 6.1 (H) 02/12/2017   She has been working on diet and exercise for prediabetes, and denies paresthesia of the feet, polydipsia, polyuria and visual disturbances. Last A1C in the office was:  Lab Results  Component Value Date   HGBA1C 5.9 (H) 02/12/2017   BMI is Body mass index is 37.89 kg/m., she is working on diet and exercise. Phentermine and contrave did not help  Wt Readings from Last 3 Encounters:  06/03/17 224 lb 3.2 oz (101.7 kg)  02/12/17 228 lb (103.4 kg)  12/04/16 222 lb (100.7 kg)   She is on thyroid medication, brand name due to allergy and is on 154mcg daily  Her medication was not changed last visit.  Lab Results  Component Value Date   TSH 4.74 (H) 04/16/2017  .   Current Medications:  Current Outpatient Prescriptions on File Prior to Visit  Medication Sig Dispense Refill  . ALPRAZolam (XANAX) 0.5 MG tablet Take 1 tablet (0.5 mg total) by mouth 2 (two) times daily as needed for anxiety. 180 tablet 0  . aspirin 81 MG tablet Take 81 mg by mouth daily.    . cholecalciferol (VITAMIN D) 1000 UNITS tablet Take 10,000 Units by mouth daily.     Marland Kitchen diltiazem (CARDIZEM CD) 240 MG 24 hr capsule TAKE 1 CAPSULE BY  MOUTH  EVERY DAY FOR BLOOD  PRESSURE 90 capsule 0  . hydrochlorothiazide (HYDRODIURIL) 25 MG tablet TAKE 1 TABLET BY MOUTH  DAILY 90 tablet 0  . levothyroxine (SYNTHROID, LEVOTHROID) 175 MCG tablet TAKE 1 TABLET BY MOUTH  DAILY BEFORE BREAKFAST, BRAND NAME ONLY, HX OF ANGEIOEDEMA TO GENERIC 90 tablet 0  . Multiple Vitamin (MULTIVITAMIN WITH MINERALS) TABS tablet Take 1 tablet by mouth daily.    . Naltrexone-Bupropion HCl ER (CONTRAVE) 8-90 MG TB12 WK 1: take 1 tablet daily, WK 2: 1 tablet BId, WK 3: 2 tablets Q AM , 1 tablet Q PM, WK4: 2 tablets BID, Maintenance dose 2 tablets  BID. 120 tablet 3  . Omega-3 Fatty Acids (FISH OIL PO) Take 1 capsule by mouth daily.    . quinapril (ACCUPRIL) 40 MG tablet TAKE 1 TABLET BY MOUTH  DAILY 90 tablet 1   No current facility-administered medications on file prior to visit.    Medical History:  Past Medical History:  Diagnosis Date  . Allergy   . Anxiety   . Arthritis    hip  . Fatty liver disease, nonalcoholic 12/7251   Via [facility]  . GERD (gastroesophageal reflux disease)   . Hyperlipidemia   . Hypertension   . Insomnia   . Nephrolithiasis   . Prediabetes    no meds - diet controlled  . Thyroid disease   . Vitamin D deficiency    Allergies:  Allergies  Allergen Reactions  . Atenolol     Beta blockers. Pt doesn't remember there being a problem  . Codeine Nausea Only  . Klonopin [Clonazepam] Rash  . Penicillins Rash    Review of Systems:  Review of Systems  Constitutional: Negative for chills, diaphoresis, fever, malaise/fatigue and weight loss.  HENT: Negative for congestion, ear discharge, ear pain, hearing loss, nosebleeds, sore throat and tinnitus.   Eyes: Negative.   Respiratory: Negative for cough, hemoptysis, sputum production, shortness of breath, wheezing and stridor.   Cardiovascular: Negative.   Gastrointestinal: Negative.   Genitourinary: Negative.   Musculoskeletal: Negative.   Skin: Negative.   Neurological: Negative for dizziness, tingling, tremors, sensory change, speech change, focal weakness, seizures, loss of consciousness, weakness and headaches.  Psychiatric/Behavioral: Negative for depression, hallucinations, memory loss, substance abuse and suicidal ideas. The patient is not nervous/anxious and does not have insomnia.     Family history- Review and unchanged Social history- Review and unchanged Physical Exam: BP 136/78   Pulse 72   Temp (!) 97.5 F (36.4 C)   Resp 16   Ht 5' 4.5" (1.638 m)   Wt 224 lb 3.2 oz (101.7 kg)   LMP 09/01/2013   BMI 37.89 kg/m  Wt Readings from  Last 3 Encounters:  06/03/17 224 lb 3.2 oz (101.7 kg)  02/12/17 228 lb (103.4 kg)  12/04/16 222 lb (100.7 kg)   General Appearance: Well nourished, in no apparent distress. Eyes: PERRLA, EOMs, conjunctiva no swelling or erythema Sinuses: + Frontal/maxillary tenderness ENT/Mouth: Ext aud canals clear, TMs without erythema, bulging. No erythema, swelling, or exudate on post pharynx.  Tonsils not swollen or erythematous. Hearing normal.  Neck: Supple, thyroid normal.  Respiratory: Respiratory effort normal, BS equal bilaterally without rales, rhonchi, wheezing or stridor.  Cardio: RRR with no MRGs. Brisk peripheral pulses without edema.  Abdomen: Soft, + BS.  Non tender, no guarding, rebound, hernias, masses. Lymphatics: Non tender without lymphadenopathy.  Musculoskeletal: Full ROM, 5/5 strength, normal gait.  Skin: Warm, dry without rashes, lesions, ecchymosis.  Neuro: Cranial nerves intact. Normal muscle tone, no cerebellar symptoms. Sensation intact.  Psych: Awake and oriented X 3, normal affect, Insight and Judgment appropriate.    Vicie Mutters, PA-C 2:54 PM [facility]

## 2017-06-03 ENCOUNTER — Ambulatory Visit (INDEPENDENT_AMBULATORY_CARE_PROVIDER_SITE_OTHER): Payer: 59 | Admitting: Physician Assistant

## 2017-06-03 ENCOUNTER — Encounter: Payer: Self-pay | Admitting: Physician Assistant

## 2017-06-03 VITALS — BP 136/78 | HR 72 | Temp 97.5°F | Resp 16 | Ht 64.5 in | Wt 224.2 lb

## 2017-06-03 DIAGNOSIS — IMO0001 Reserved for inherently not codable concepts without codable children: Secondary | ICD-10-CM

## 2017-06-03 DIAGNOSIS — I10 Essential (primary) hypertension: Secondary | ICD-10-CM | POA: Diagnosis not present

## 2017-06-03 DIAGNOSIS — E785 Hyperlipidemia, unspecified: Secondary | ICD-10-CM | POA: Diagnosis not present

## 2017-06-03 DIAGNOSIS — E669 Obesity, unspecified: Secondary | ICD-10-CM | POA: Diagnosis not present

## 2017-06-03 DIAGNOSIS — Z79899 Other long term (current) drug therapy: Secondary | ICD-10-CM

## 2017-06-03 DIAGNOSIS — R7303 Prediabetes: Secondary | ICD-10-CM

## 2017-06-03 DIAGNOSIS — K760 Fatty (change of) liver, not elsewhere classified: Secondary | ICD-10-CM

## 2017-06-03 DIAGNOSIS — E079 Disorder of thyroid, unspecified: Secondary | ICD-10-CM

## 2017-06-03 LAB — BASIC METABOLIC PANEL WITH GFR
BUN: 12 mg/dL (ref 7–25)
CO2: 28 mmol/L (ref 20–31)
CREATININE: 0.81 mg/dL (ref 0.50–1.05)
Calcium: 9.5 mg/dL (ref 8.6–10.4)
Chloride: 100 mmol/L (ref 98–110)
GFR, Est Non African American: 81 mL/min (ref >=60)
Glucose, Bld: 99 mg/dL (ref 65–99)
Potassium: 4.1 mmol/L (ref 3.5–5.3)
Sodium: 139 mmol/L (ref 135–146)

## 2017-06-03 LAB — CBC WITH DIFFERENTIAL/PLATELET
BASOS ABS: 108 {cells}/uL (ref 0–200)
Basophils Relative: 1 %
EOS ABS: 324 {cells}/uL (ref 15–500)
Eosinophils Relative: 3 %
HCT: 42.2 % (ref 35.0–45.0)
Hemoglobin: 13.6 g/dL (ref 11.7–15.5)
LYMPHS PCT: 26 %
Lymphs Abs: 2808 cells/uL (ref 850–3900)
MCH: 29.4 pg (ref 27.0–33.0)
MCHC: 32.2 g/dL (ref 32.0–36.0)
MCV: 91.1 fL (ref 80.0–100.0)
MONOS PCT: 8 %
MPV: 10.1 fL (ref 7.5–12.5)
Monocytes Absolute: 864 cells/uL (ref 200–950)
NEUTROS PCT: 62 %
Neutro Abs: 6696 cells/uL (ref 1500–7800)
PLATELETS: 335 10*3/uL (ref 140–400)
RBC: 4.63 MIL/uL (ref 3.80–5.10)
RDW: 13.8 % (ref 11.0–15.0)
WBC: 10.8 10*3/uL (ref 3.8–10.8)

## 2017-06-03 LAB — HEPATIC FUNCTION PANEL
ALBUMIN: 4 g/dL (ref 3.6–5.1)
ALT: 30 U/L — ABNORMAL HIGH (ref 6–29)
AST: 61 U/L — ABNORMAL HIGH (ref 10–35)
Alkaline Phosphatase: 106 U/L (ref 33–130)
BILIRUBIN TOTAL: 0.5 mg/dL (ref 0.2–1.2)
Bilirubin, Direct: 0.1 mg/dL (ref <=0.2)
Indirect Bilirubin: 0.4 mg/dL (ref 0.2–1.2)
Total Protein: 7.4 g/dL (ref 6.1–8.1)

## 2017-06-03 LAB — LIPID PANEL
Cholesterol: 191 mg/dL (ref <200)
HDL: 33 mg/dL — AB (ref >50)
LDL Cholesterol: 126 mg/dL — ABNORMAL HIGH (ref <100)
Total CHOL/HDL Ratio: 5.8 Ratio — ABNORMAL HIGH (ref <5.0)
Triglycerides: 159 mg/dL — ABNORMAL HIGH (ref <150)
VLDL: 32 mg/dL — ABNORMAL HIGH (ref <30)

## 2017-06-03 LAB — TSH: TSH: 8.13 m[IU]/L — AB

## 2017-06-03 NOTE — Patient Instructions (Signed)
Simple math prevails.    1st - exercise does not produce significant weight loss - at best one converts fat into muscle , "bulks up", loses inches, but usually stays "weight neutral"     2nd - think of your body weightas a check book: If you eat more calories than you burn up - you save money or gain weight .... Or if you spend more money than you put in the check book, ie burn up more calories than you eat, then you lose weight     3rd - if you walk or run 1 mile, you burn up 100 calories - you have to burn up 3,500 calories to lose 1 pound, ie you have to walk/run 35 miles to lose 1 measly pound. So if you want to lose 10 #, then you have to walk/run 350 miles, so.... clearly exercise is not the solution.     4. So if you consume 1,500 calories, then you have to burn up the equivalent of 15 miles to stay weight neutral - It also stands to reason that if you consume 1,500 cal/day and don't lose weight, then you must be burning up about 1,500 cals/day to stay weight neutral.     5. If you really want to lose weight, you must cut your calorie intake 300 calories /day and at that rate you should lose about 1 # every 3 days.   6. Please purchase Dr Fara Olden Fuhrman's book(s) "The End of Dieting" & "Eat to Live" . It has some great concepts and recipes.      We want weight loss that will last so you should lose 1-2 pounds a week.  THAT IS IT! Please pick THREE things a month to change. Once it is a habit check off the item. Then pick another three items off the list to become habits.  If you are already doing a habit on the list GREAT!  Cross that item off! o Don't drink your calories. Ie, alcohol, soda, fruit juice, and sweet tea.  o Drink more water. Drink a glass when you feel hungry or before each meal.  o Eat breakfast - Complex carb and protein (likeDannon light and fit yogurt, oatmeal, fruit, eggs, [place] bacon). o Measure your cereal.  Eat no more than one cup a day. (ie [place]) o Eat an apple  a day. o Add a vegetable a day. o Try a new vegetable a month. o Use Pam! Stop using oil or butter to cook. o Don't finish your plate or use smaller plates. o Share your dessert. o Eat sugar free Jello for dessert or frozen grapes. o Don't eat 2-3 hours before bed. o Switch to whole wheat bread, pasta, and brown rice. o Make healthier choices when you eat out. No fries! o Pick baked chicken, NOT fried. o Don't forget to SLOW DOWN when you eat. It is not going anywhere.  o Take the stairs. o Park far away in the parking lot o [employer] (or weights) for 10 minutes while watching TV. o Walk at work for 10 minutes during break. o Walk outside 1 time a week with your friend, kids, dog, or significant other. o Start a walking group at [place] the mall as much as you can tolerate.  o Keep a food diary. o Weigh yourself daily. o Walk for 15 minutes 3 days per week. o Cook at home more often and eat out less.  If life happens and you  go back to old habits, it is okay.  Just start over. You can do it!   If you experience chest pain, get short of breath, or tired during the exercise, please stop immediately and inform your doctor.      Bad carbs also include fruit juice, alcohol, and sweet tea. These are empty calories that do not signal to your brain that you are full.   Please remember the good carbs are still carbs which convert into sugar. So please measure them out no more than 1/2-1 cup of rice, oatmeal, pasta, and beans  Veggies are however free foods! Pile them on.   Not all fruit is created equal. Please see the list below, the fruit at the bottom is higher in sugars than the fruit at the top. Please avoid all dried fruits.

## 2017-06-04 ENCOUNTER — Other Ambulatory Visit: Payer: Self-pay | Admitting: Physician Assistant

## 2017-06-04 DIAGNOSIS — E079 Disorder of thyroid, unspecified: Secondary | ICD-10-CM

## 2017-06-04 LAB — HEMOGLOBIN A1C
HEMOGLOBIN A1C: 6.7 % — AB (ref <5.7)
Mean Plasma Glucose: 146 mg/dL

## 2017-06-04 LAB — MAGNESIUM: Magnesium: 1.9 mg/dL (ref 1.5–2.5)

## 2017-06-04 MED ORDER — LEVOTHYROXINE SODIUM 200 MCG PO TABS: ORAL_TABLET | ORAL | 3 refills | Status: DC

## 2017-06-10 ENCOUNTER — Encounter: Payer: Self-pay | Admitting: Physician Assistant

## 2017-07-03 ENCOUNTER — Other Ambulatory Visit: Payer: Self-pay | Admitting: Internal Medicine

## 2017-07-06 NOTE — Progress Notes (Signed)
Diabetes Education and Follow-Up Visit  56 y.o.female presents for diabetic education. She has Diabetes Mellitus type 2:  with CKD stage 2 GFR 60-89.  Patient denies paresthesia of the feet, polydipsia, polyuria and visual disturbances. The patient is on a low dose aspirin at this time.   She is on thyroid medication. Her medication was changed last visit. She is taking it in the AM, waiting 1 hour.    Lab Results  Component Value Date   TSH 8.13 (H) 06/03/2017  .  Last hemoglobin A1c was: Lab Results  Component Value Date   HGBA1C 6.7 (H) 06/03/2017   HGBA1C 5.9 (H) 02/12/2017   HGBA1C 5.8 (H) 11/08/2016   BMI is Body mass index is 37.35 kg/m., she is working on diet and exercise. Wt Readings from Last 3 Encounters:  07/08/17 221 lb (100.2 kg)  06/03/17 224 lb 3.2 oz (101.7 kg)  02/12/17 228 lb (103.4 kg)   Pt is on a regimen of none at this time, just had DM diagnosis last visit.   No glucometer.    Exercise: started walking everyday during the week  Patient does have CKD She is on ACE/ARB Lab Results  Component Value Date   GFRNONAA 81 06/03/2017   Lab Results  Component Value Date   CREATININE 0.81 06/03/2017   BUN 12 06/03/2017   NA 139 06/03/2017   K 4.1 06/03/2017   CL 100 06/03/2017   CO2 28 06/03/2017   Lab Results  Component Value Date   MICROALBUR <0.2 11/07/2016    She is not on a Statin.  Lab Results  Component Value Date   CHOL 191 06/03/2017   HDL 33 (L) 06/03/2017   LDLCALC 126 (H) 06/03/2017   TRIG 159 (H) 06/03/2017   CHOLHDL 5.8 (H) 06/03/2017    Problem List has Thyroid disease; Hypertension; Hyperlipidemia; Vitamin D deficiency; Prediabetes; Medication management; Bilateral fibrocystic breast disease (FCBD); Nonalcoholic fatty liver disease; History of nephrolithiasis; Insomnia; and Obesity, Class II, BMI 35-39.9, with comorbidity ([place]) on her problem list.  Medications Current Outpatient Prescriptions on File Prior to Visit   Medication Sig  . ALPRAZolam (XANAX) 0.5 MG tablet Take 1 tablet (0.5 mg total) by mouth 2 (two) times daily as needed for anxiety.  Marland Kitchen aspirin 81 MG tablet Take 81 mg by mouth daily.  . cholecalciferol (VITAMIN D) 1000 UNITS tablet Take 10,000 Units by mouth daily.   Marland Kitchen diltiazem (CARDIZEM CD) 240 MG 24 hr capsule TAKE 1 CAPSULE BY MOUTH  EVERY DAY FOR BLOOD  PRESSURE  . hydrochlorothiazide (HYDRODIURIL) 25 MG tablet TAKE 1 TABLET BY MOUTH  DAILY  . levothyroxine (SYNTHROID, LEVOTHROID) 200 MCG tablet TAKE 1 TABLET BY MOUTH  DAILY BEFORE BREAKFAST, BRAND NAME ONLY, HX OF ANGEIOEDEMA TO GENERIC  . Multiple Vitamin (MULTIVITAMIN WITH MINERALS) TABS tablet Take 1 tablet by mouth daily.  . Omega-3 Fatty Acids (FISH OIL PO) Take 1 capsule by mouth daily.  . quinapril (ACCUPRIL) 40 MG tablet TAKE 1 TABLET BY MOUTH  DAILY   No current facility-administered medications on file prior to visit.     Review of Systems  Constitutional: Negative.   HENT: Negative.   Eyes: Negative.   Respiratory: Negative.   Cardiovascular: Negative.   Gastrointestinal: Negative.   Genitourinary: Negative.   Musculoskeletal: Negative.   Skin: Negative.   Neurological: Negative.   Endo/Heme/Allergies: Negative.   Psychiatric/Behavioral: Negative.     Physical Exam: Blood pressure 126/80, pulse 66, temperature (!) 97.3 F (36.3 C),  resp. rate 14, height 5' 4.5" (1.638 m), weight 221 lb (100.2 kg), last menstrual period 09/01/2013, SpO2 96 %. Body mass index is 37.35 kg/m. General Appearance: Well nourished, in no apparent distress. Eyes: PERRLA, EOMs, conjunctiva no swelling or erythema ENT/Mouth: Ext aud canals clear, TMs without erythema, bulging. No erythema, swelling, or exudate on post pharynx.  Tonsils not swollen or erythematous. Hearing normal.  Respiratory: Respiratory effort normal, BS equal bilaterally without rales, rhonchi, wheezing or stridor.  Cardio: RRR with no MRGs. Brisk peripheral pulses  without edema.  Abdomen: Soft, + BS.  Non tender, no guarding, rebound, hernias, masses. Musculoskeletal: Full ROM, 5/5 strength, normal gait.  Skin: Warm, dry without rashes, lesions, ecchymosis.  Neuro: Cranial nerves intact. Normal muscle tone, no cerebellar symptoms. Sensation intact.    Plan and Assessment: Diabetes Education: Reviewed 'ABCs' of diabetes management (respective goals in parentheses):  A1C (<7), blood pressure (<130/80), and cholesterol (LDL <70) Eye Exam yearly and Dental Exam every 6 months. Dietary recommendations Physical Activity recommendations - Strongly advised her to start checking sugars at different times of the day - check 2 times a day,   Hypothyroidism -check TSH level, continue medications the same, reminded to take on an empty stomach 30-57mins before food.    Future Appointments Date Time Provider [place]  09/09/2017 2:30 PM Vicie Mutters, PA-C [facility] None  11/20/2017 3:00 PM Vicie Mutters, PA-C [facility] None

## 2017-07-08 ENCOUNTER — Ambulatory Visit (INDEPENDENT_AMBULATORY_CARE_PROVIDER_SITE_OTHER): Payer: 59 | Admitting: Physician Assistant

## 2017-07-08 ENCOUNTER — Encounter: Payer: Self-pay | Admitting: Physician Assistant

## 2017-07-08 VITALS — BP 126/80 | HR 66 | Temp 97.3°F | Resp 14 | Ht 64.5 in | Wt 221.0 lb

## 2017-07-08 DIAGNOSIS — E785 Hyperlipidemia, unspecified: Secondary | ICD-10-CM

## 2017-07-08 DIAGNOSIS — I10 Essential (primary) hypertension: Secondary | ICD-10-CM | POA: Diagnosis not present

## 2017-07-08 DIAGNOSIS — R7303 Prediabetes: Secondary | ICD-10-CM

## 2017-07-08 DIAGNOSIS — E039 Hypothyroidism, unspecified: Secondary | ICD-10-CM | POA: Diagnosis not present

## 2017-07-08 LAB — TSH: TSH: 4.14 mIU/L (ref 0.40–4.50)

## 2017-07-08 NOTE — Patient Instructions (Addendum)
Your A1C is a measure of your sugar over the past 3 months and is not affected by what you have eaten over the past few days. Diabetes increases your chances of stroke and heart attack over 300 % and is the leading cause of blindness and kidney failure in the [place]. Please make sure you decrease bad carbs like white bread, white rice, potatoes, corn, soft drinks, pasta, cereals, refined sugars, sweet tea, dried fruits, and fruit juice. Good carbs are okay to eat in moderation like sweet potatoes, brown rice, whole grain pasta/bread, most fruit (except dried fruit) and you can eat as many veggies as you want.   Greater than 6.5 is considered diabetic. Between 6.4 and 5.7 is prediabetic If your A1C is less than 5.7 you are NOT diabetic.  Targets for Glucose Readings: Time of Check Target for patients WITHOUT Diabetes Target for DIABETICS  Before Meals Less than 100  less than 150  Two hours after meals Less than 200  Less than 250    Diabetes is a very complicated disease...lets simplify it.  An easy way to look at it to understand the complications is if you think of the extra sugar floating in your blood stream as glass shards floating through your blood stream.    Diabetes affects your small vessels first: 1) The glass shards (sugar) scraps down the tiny blood vessels in your eyes and lead to diabetic retinopathy, the leading cause of blindness in the [place]. Diabetes is the leading cause of newly diagnosed adult (62 to 58 years of age) blindness in the [place].  2) The glass shards scratches down the tiny vessels of your legs leading to nerve damage called neuropathy and can lead to amputations of your feet. More than 60% of all non-traumatic amputations of lower limbs occur in people with diabetes.  3) Over time the small vessels in your brain are shredded and closed off, individually this does not cause any problems but over a long period of time many of the small vessels being  blocked can lead to Vascular Dementia.   4) Your kidney's are a filter system and have a "net" that keeps certain things in the body and lets bad things out. Sugar shreds this net and leads to kidney damage and eventually failure. Decreasing the sugar that is destroying the net and certain blood pressure medications can help stop or decrease progression of kidney disease. Diabetes was the primary cause of kidney failure in 44 percent of all new cases in 2011.  5) Diabetes also destroys the small vessels in your penis that lead to erectile dysfunction. Eventually the vessels are so damaged that you may not be responsive to cialis or viagra.   Diabetes and your large vessels: Your larger vessels consist of your coronary arteries in your heart and the carotid vessels to your brain. Diabetes or even increased sugars put you at 300% increased risk of heart attack and stroke and this is why.. The sugar scrapes down your large blood vessels and your body sees this as an internal injury and tries to repair itself. Just like you get a scab on your skin, your platelets will stick to the blood vessel wall trying to heal it. This is why we have diabetics on low dose aspirin daily, this prevents the platelets from sticking and can prevent plaque formation. In addition, your body takes cholesterol and tries to shove it into the open wound. This is why we want your LDL, or  bad cholesterol, below 70.   The combination of platelets and cholesterol over 5-10 years forms plaque that can break off and cause a heart attack or stroke.   PLEASE REMEMBER:  Diabetes is preventable! Up to 74 percent of complications and morbidities among individuals with type 2 diabetes can be prevented, delayed, or effectively treated and minimized with regular visits to a health professional, appropriate monitoring and medication, and a healthy diet and lifestyle.     Bad carbs also include fruit juice, alcohol, and sweet tea. These are  empty calories that do not signal to your brain that you are full.   Please remember the good carbs are still carbs which convert into sugar. So please measure them out no more than 1/2-1 cup of rice, oatmeal, pasta, and beans  Veggies are however free foods! Pile them on.   Not all fruit is created equal. Please see the list below, the fruit at the bottom is higher in sugars than the fruit at the top. Please avoid all dried fruits.    Benefiber is good for constipation/diarrhea/irritable bowel syndrome, it helps with weight loss and can help lower your bad cholesterol. Please do 1 TBSP in the morning in water, coffee, or tea. It can take up to a month before you can see a difference with your bowel movements. It is cheapest from [employer], [employer], [employer].

## 2017-08-19 ENCOUNTER — Other Ambulatory Visit: Payer: Self-pay | Admitting: Physician Assistant

## 2017-08-19 ENCOUNTER — Encounter: Payer: Self-pay | Admitting: Physician Assistant

## 2017-08-19 MED ORDER — PREDNISONE 20 MG PO TABS: ORAL_TABLET | ORAL | 0 refills | Status: DC

## 2017-08-22 MED ORDER — AZITHROMYCIN 250 MG PO TABS: ORAL_TABLET | ORAL | 1 refills | Status: AC

## 2017-09-09 ENCOUNTER — Encounter: Payer: Self-pay | Admitting: Physician Assistant

## 2017-09-09 ENCOUNTER — Ambulatory Visit (INDEPENDENT_AMBULATORY_CARE_PROVIDER_SITE_OTHER): Payer: 59 | Admitting: Physician Assistant

## 2017-09-09 VITALS — BP 128/70 | HR 63 | Temp 97.5°F | Resp 16 | Ht 64.5 in | Wt 212.2 lb

## 2017-09-09 DIAGNOSIS — R7303 Prediabetes: Secondary | ICD-10-CM

## 2017-09-09 DIAGNOSIS — Z79899 Other long term (current) drug therapy: Secondary | ICD-10-CM

## 2017-09-09 DIAGNOSIS — E079 Disorder of thyroid, unspecified: Secondary | ICD-10-CM

## 2017-09-09 DIAGNOSIS — E785 Hyperlipidemia, unspecified: Secondary | ICD-10-CM | POA: Diagnosis not present

## 2017-09-09 DIAGNOSIS — K760 Fatty (change of) liver, not elsewhere classified: Secondary | ICD-10-CM

## 2017-09-09 DIAGNOSIS — Z6835 Body mass index (BMI) 35.0-35.9, adult: Secondary | ICD-10-CM

## 2017-09-09 DIAGNOSIS — I10 Essential (primary) hypertension: Secondary | ICD-10-CM

## 2017-09-09 NOTE — Patient Instructions (Signed)
Before you even begin to attack a weight-loss plan, it pays to remember this: You are not fat. You have fat. Losing weight isn't about blame or shame; it's simply another achievement to accomplish. Dieting is like any other skill-you have to buckle down and work at it. As long as you act in a smart, reasonable way, you'll ultimately get where you want to be. Here are some weight loss pearls for you.  1. It's Not a Diet. It's a Lifestyle Thinking of a diet as something you're on and suffering through only for the short term doesn't work. To shed weight and keep it off, you need to make permanent changes to the way you eat. It's OK to indulge occasionally, of course, but if you cut calories temporarily and then revert to your old way of eating, you'll gain back the weight quicker than you can say yo-yo. Use it to lose it. Research shows that one of the best predictors of long-term weight loss is how many pounds you drop in the first month. For that reason, nutritionists often suggest being stricter for the first two weeks of your new eating strategy to build momentum. Cut out added sugar and alcohol and avoid unrefined carbs. After that, figure out how you can reincorporate them in a way that's healthy and maintainable.  2. There's a Right Way to Exercise Working out burns calories and fat and boosts your metabolism by building muscle. But those trying to lose weight are notorious for overestimating the number of calories they burn and underestimating the amount they take in. Unfortunately, your system is biologically programmed to hold on to extra pounds and that means when you start exercising, your body senses the deficit and ramps up its hunger signals. If you're not diligent, you'll eat everything you burn and then some. Use it to lose it. Cardio gets all the exercise glory, but strength and interval training are the real heroes. They help you build lean muscle, which in turn increases your metabolism and  calorie-burning ability 3. Don't Overreact to Mild Hunger Some people have a hard time losing weight because of hunger anxiety. To them, being hungry is bad-something to be avoided at all costs-so they carry snacks with them and eat when they don't need to. Others eat because they're stressed out or bored. While you never want to get to the point of being ravenous (that's when bingeing is likely to happen), a hunger pang, a craving, or the fact that it's 3:00 p.m. should not send you racing for the vending machine or obsessing about the energy bar in your purse. Ideally, you should put off eating until your stomach is growling and it's difficult to concentrate.  Use it to lose it. When you feel the urge to eat, use the HALT method. Ask yourself, Am I really hungry? Or am I angry or anxious, lonely or bored, or tired? If you're still not certain, try the apple test. If you're truly hungry, an apple should seem delicious; if it doesn't, something else is going on. Or you can try drinking water and making yourself busy, if you are still hungry try a healthy snack.  4. Not All Calories Are Created Equal The mechanics of weight loss are pretty simple: Take in fewer calories than you use for energy. But the kind of food you eat makes all the difference. Processed food that's high in saturated fat and refined starch or sugar can cause inflammation that disrupts the hormone signals that tell  your brain you're full. The result: You eat a lot more.  Use it to lose it. Clean up your diet. Swap in whole, unprocessed foods, including vegetables, lean protein, and healthy fats that will fill you up and give you the biggest nutritional bang for your calorie buck. In a few weeks, as your brain starts receiving regular hunger and fullness signals once again, you'll notice that you feel less hungry overall and naturally start cutting back on the amount you eat.  5. Protein, Produce, and Plant-Based Fats Are Your Weight-Loss  [place] Here's why eating the three Ps regularly will help you drop pounds. Protein fills you up. You need it to build lean muscle, which keeps your metabolism humming so that you can torch more fat. People in a weight-loss program who ate double the recommended daily allowance for protein (about 110 grams for a 150-pound woman) lost 70 percent of their weight from fat, while people who ate the RDA lost only about 40 percent, one study found. Produce is packed with filling fiber. "It's very difficult to consume too many calories if you're eating a lot of vegetables. Example: Three cups of broccoli is a lot of food, yet only 93 calories. (Fruit is another story. It can be easy to overeat and can contain a lot of calories from sugar, so be sure to monitor your intake.) Plant-based fats like olive oil and those in avocados and nuts are healthy and extra satiating.  Use it to lose it. Aim to incorporate each of the three Ps into every meal and snack. People who eat protein throughout the day are able to keep weight off, according to a study in the American Journal of Clinical Nutrition. In addition to meat, poultry and seafood, good sources are beans, lentils, eggs, tofu, and yogurt. As for fat, keep portion sizes in check by measuring out salad dressing, oil, and nut butters (shoot for one to two tablespoons). Finally, eat veggies or a little fruit at every meal. People who did that consumed 308 fewer calories but didn't feel any hungrier than when they didn't eat more produce.  7. How You Eat Is As Important As What You Eat In order for your brain to register that you're full, you need to focus on what you're eating. Sit down whenever you eat, preferably at a table. Turn off the TV or computer, put down your phone, and look at your food. Smell it. Chew slowly, and don't put another bite on your fork until you swallow. When women ate lunch this attentively, they consumed 30 percent less when snacking later than  those who listened to an audiobook at lunchtime, according to a study in the British Journal of Nutrition. 8. Weighing Yourself Really Works The scale provides the best evidence about whether your efforts are paying off. Seeing the numbers tick up or down or stagnate is motivation to keep going-or to rethink your approach. A 2015 study at [employer] found that daily weigh-ins helped people lose more weight, keep it off, and maintain that loss, even after two years. Use it to lose it. Step on the scale at the same time every day for the best results. If your weight shoots up several pounds from one weigh-in to the next, don't freak out. Eating a lot of salt the night before or having your period is the likely culprit. The number should return to normal in a day or two. It's a steady climb that you need to do something about.   9. Too Much Stress and Too Little Sleep Are Your Enemies When you're tired and frazzled, your body cranks up the production of cortisol, the stress hormone that can cause carb cravings. Not getting enough sleep also boosts your levels of ghrelin, a hormone associated with hunger, while suppressing leptin, a hormone that signals fullness and satiety. People on a diet who slept only five and a half hours a night for two weeks lost 55 percent less fat and were hungrier than those who slept eight and a half hours, according to a study in the [place]. Use it to lose it. Prioritize sleep, aiming for seven hours or more a night, which research shows helps lower stress. And make sure you're getting quality zzz's. If a snoring spouse or a fidgety cat wakes you up frequently throughout the night, you may end up getting the equivalent of just four hours of sleep, according to a study from [facility]. Keep pets out of the bedroom, and use a white-noise app to drown out snoring. 10. You Will Hit a plateau-And You Can Bust Through It As you slim down, your  body releases much less leptin, the fullness hormone.  If you're not strength training, start right now. Building muscle can raise your metabolism to help you overcome a plateau. To keep your body challenged and burning calories, incorporate new moves and more intense intervals into your workouts or add another sweat session to your weekly routine. Alternatively, cut an extra 100 calories or so a day from your diet. Now that you've lost weight, your body simply doesn't need as much fuel.   Drink 80-100 oz a day of water, measure it out Eat 3 meals a day, have to do breakfast, eat protein- hard boiled eggs, protein bar like [place] protein bar, greek yogurt like oikos triple zero, chobani 100, or light n fit greek

## 2017-09-09 NOTE — Progress Notes (Signed)
Assessment and Plan:   Essential hypertension - continue medications, DASH diet, exercise and monitor at home. Call if greater than 130/80.  -     CBC with Differential/Platelet -     BASIC METABOLIC PANEL WITH GFR -     Hepatic function panel  Nonalcoholic fatty liver disease Check labs, avoid tylenol, alcohol, weight loss advised.  -     Hepatic function panel  Thyroid disease Hypothyroidism-check TSH level, continue medications the same, reminded to take on an empty stomach 30-21mins before food.  -     levothyroxine (SYNTHROID, LEVOTHROID) 150 MCG tablet; TAKE 1 TABLET BY MOUTH  DAILY BEFORE BREAKFAST, BRAND NAME ONLY, HX OF ANGEIOEDEMA TO GENERIC -     TSH -  If normal will send to mail order  Hyperlipidemia, unspecified hyperlipidemia type -continue medications, check lipids, decrease fatty foods, increase activity.  -     Hemoglobin A1c  Medication management -     Magnesium  Obesity, Class II, BMI 35-39.9, with comorbidity (HCC) diet discussed, continue low calorie diet and walking, follow up 3 months -     Lipid panel -     Hemoglobin A1c  Prediabetes Discussed general issues about diabetes pathophysiology and management., Educational material distributed., Suggested low cholesterol diet., Encouraged aerobic exercise., Discussed foot care., Reminded to get yearly retinal exam. -     Hemoglobin A1c   Continue diet and meds as discussed. Further disposition pending results of labs. Future Appointments Date Time Provider [place]  11/20/2017 3:00 PM Vicie Mutters, PA-C [id number] None    HPI 56 y.o. female  presents for 3 month follow up with hypertension, hyperlipidemia, prediabetes and vitamin D. Her blood pressure has been controlled at home, today their BP is BP: 128/70 She does workout, walks daily, getting 10,000 steps.  She denies chest pain, shortness of breath, dizziness.  She is on cholesterol medication and denies myalgias. Her cholesterol is not  at goal. The cholesterol last visit was:   Lab Results  Component Value Date   CHOL 191 06/03/2017   HDL 33 (L) 06/03/2017   LDLCALC 126 (H) 06/03/2017   TRIG 159 (H) 06/03/2017   CHOLHDL 5.8 (H) 06/03/2017   She has been working on diet and exercise for diabetes has lost 12 lbs from July, and denies paresthesia of the feet, polydipsia, polyuria and visual disturbances. Last A1C in the office was:  Lab Results  Component Value Date   HGBA1C 6.7 (H) 06/03/2017   BMI is Body mass index is 35.86 kg/m., she is working on diet and exercise. Phentermine and contrave did not help She has started to walk and trying to cut out sugar, drinking lots of water.  Wt Readings from Last 3 Encounters:  09/09/17 212 lb 3.2 oz (96.3 kg)  07/08/17 221 lb (100.2 kg)  06/03/17 224 lb 3.2 oz (101.7 kg)   She is on thyroid medication, brand name due to allergy and is on 117mcg daily  Her medication was not changed last visit.  Lab Results  Component Value Date   TSH 4.14 07/08/2017  .   Current Medications:  Current Outpatient Prescriptions on File Prior to Visit  Medication Sig Dispense Refill  . ALPRAZolam (XANAX) 0.5 MG tablet Take 1 tablet (0.5 mg total) by mouth 2 (two) times daily as needed for anxiety. 180 tablet 0  . aspirin 81 MG tablet Take 81 mg by mouth daily.    . cholecalciferol (VITAMIN D) 1000 UNITS tablet Take 10,000 Units  by mouth daily.     Marland Kitchen diltiazem (CARDIZEM CD) 240 MG 24 hr capsule TAKE 1 CAPSULE BY MOUTH  EVERY DAY FOR BLOOD  PRESSURE 90 capsule 1  . hydrochlorothiazide (HYDRODIURIL) 25 MG tablet TAKE 1 TABLET BY MOUTH  DAILY 90 tablet 1  . levothyroxine (SYNTHROID, LEVOTHROID) 200 MCG tablet TAKE 1 TABLET BY MOUTH  DAILY BEFORE BREAKFAST, BRAND NAME ONLY, HX OF ANGEIOEDEMA TO GENERIC 30 tablet 3  . Multiple Vitamin (MULTIVITAMIN WITH MINERALS) TABS tablet Take 1 tablet by mouth daily.    . Omega-3 Fatty Acids (FISH OIL PO) Take 1 capsule by mouth daily.    . quinapril  (ACCUPRIL) 40 MG tablet TAKE 1 TABLET BY MOUTH  DAILY 90 tablet 1   No current facility-administered medications on file prior to visit.    Medical History:  Past Medical History:  Diagnosis Date  . Allergy   . Anxiety   . Arthritis    hip  . Fatty liver disease, nonalcoholic 28/4132   Via [facility]  . GERD (gastroesophageal reflux disease)   . Hyperlipidemia   . Hypertension   . Insomnia   . Nephrolithiasis   . Prediabetes    no meds - diet controlled  . Thyroid disease   . Vitamin D deficiency    Allergies:  Allergies  Allergen Reactions  . Atenolol     Beta blockers. Pt doesn't remember there being a problem  . Codeine Nausea Only  . Klonopin [Clonazepam] Rash  . Penicillins Rash    Review of Systems:  Review of Systems  Constitutional: Negative for chills, diaphoresis, fever, malaise/fatigue and weight loss.  HENT: Negative for congestion, ear discharge, ear pain, hearing loss, nosebleeds, sore throat and tinnitus.   Eyes: Negative.   Respiratory: Negative for cough, hemoptysis, sputum production, shortness of breath, wheezing and stridor.   Cardiovascular: Negative.   Gastrointestinal: Negative.   Genitourinary: Negative.   Musculoskeletal: Negative.   Skin: Negative.   Neurological: Negative for dizziness, tingling, tremors, sensory change, speech change, focal weakness, seizures, loss of consciousness, weakness and headaches.  Psychiatric/Behavioral: Negative for depression, hallucinations, memory loss, substance abuse and suicidal ideas. The patient is not nervous/anxious and does not have insomnia.     Family history- Review and unchanged Social history- Review and unchanged Physical Exam: BP 128/70   Pulse 63   Temp (!) 97.5 F (36.4 C)   Resp 16   Ht 5' 4.5" (1.638 m)   Wt 212 lb 3.2 oz (96.3 kg)   LMP 09/01/2013   SpO2 92%   BMI 35.86 kg/m  Wt Readings from Last 3 Encounters:  09/09/17 212 lb 3.2 oz (96.3 kg)  07/08/17 221 lb (100.2 kg)  06/03/17  224 lb 3.2 oz (101.7 kg)   General Appearance: Well nourished, in no apparent distress. Eyes: PERRLA, EOMs, conjunctiva no swelling or erythema Sinuses: + Frontal/maxillary tenderness ENT/Mouth: Ext aud canals clear, TMs without erythema, bulging. No erythema, swelling, or exudate on post pharynx.  Tonsils not swollen or erythematous. Hearing normal.  Neck: Supple, thyroid normal.  Respiratory: Respiratory effort normal, BS equal bilaterally without rales, rhonchi, wheezing or stridor.  Cardio: RRR with no MRGs. Brisk peripheral pulses without edema.  Abdomen: Soft, + BS.  Non tender, no guarding, rebound, hernias, masses. Lymphatics: Non tender without lymphadenopathy.  Musculoskeletal: Full ROM, 5/5 strength, normal gait.  Skin: Warm, dry without rashes, lesions, ecchymosis.  Neuro: Cranial nerves intact. Normal muscle tone, no cerebellar symptoms. Sensation intact.  Psych: Awake and  oriented X 3, normal affect, Insight and Judgment appropriate.    Vicie Mutters, PA-C 2:41 PM [facility]

## 2017-09-10 ENCOUNTER — Encounter: Payer: Self-pay | Admitting: Physician Assistant

## 2017-09-10 LAB — HEPATIC FUNCTION PANEL
AG RATIO: 1.4 (calc) (ref 1.0–2.5)
ALBUMIN MSPROF: 4.2 g/dL (ref 3.6–5.1)
ALT: 29 U/L (ref 6–29)
AST: 45 U/L — ABNORMAL HIGH (ref 10–35)
Alkaline phosphatase (APISO): 98 U/L (ref 33–130)
Bilirubin, Direct: 0.1 mg/dL (ref 0.0–0.2)
GLOBULIN: 3.1 g/dL (ref 1.9–3.7)
Indirect Bilirubin: 0.4 mg/dL (calc) (ref 0.2–1.2)
TOTAL PROTEIN: 7.3 g/dL (ref 6.1–8.1)
Total Bilirubin: 0.5 mg/dL (ref 0.2–1.2)

## 2017-09-10 LAB — CBC WITH DIFFERENTIAL/PLATELET
BASOS PCT: 1 %
Basophils Absolute: 82 cells/uL (ref 0–200)
Eosinophils Absolute: 369 cells/uL (ref 15–500)
Eosinophils Relative: 4.5 %
HCT: 40.2 % (ref 35.0–45.0)
Hemoglobin: 13.3 g/dL (ref 11.7–15.5)
Lymphs Abs: 2526 cells/uL (ref 850–3900)
MCH: 28.7 pg (ref 27.0–33.0)
MCHC: 33.1 g/dL (ref 32.0–36.0)
MCV: 86.6 fL (ref 80.0–100.0)
MONOS PCT: 7.8 %
MPV: 11.2 fL (ref 7.5–12.5)
NEUTROS PCT: 55.9 %
Neutro Abs: 4584 cells/uL (ref 1500–7800)
PLATELETS: 327 10*3/uL (ref 140–400)
RBC: 4.64 10*6/uL (ref 3.80–5.10)
RDW: 13.1 % (ref 11.0–15.0)
TOTAL LYMPHOCYTE: 30.8 %
WBC mixed population: 640 cells/uL (ref 200–950)
WBC: 8.2 10*3/uL (ref 3.8–10.8)

## 2017-09-10 LAB — BASIC METABOLIC PANEL WITH GFR
BUN: 9 mg/dL (ref 7–25)
CHLORIDE: 99 mmol/L (ref 98–110)
CO2: 29 mmol/L (ref 20–32)
Calcium: 9.8 mg/dL (ref 8.6–10.4)
Creat: 0.79 mg/dL (ref 0.50–1.05)
GFR, EST AFRICAN AMERICAN: 96 mL/min/{1.73_m2} (ref >=60)
GFR, Est Non African American: 83 mL/min/{1.73_m2} (ref >=60)
GLUCOSE: 94 mg/dL (ref 65–99)
POTASSIUM: 4.1 mmol/L (ref 3.5–5.3)
SODIUM: 137 mmol/L (ref 135–146)

## 2017-09-10 LAB — HEMOGLOBIN A1C
EAG (MMOL/L): 7 (calc)
Hgb A1c MFr Bld: 6 % of total Hgb — ABNORMAL HIGH (ref <5.7)
MEAN PLASMA GLUCOSE: 126 (calc)

## 2017-09-10 LAB — LIPID PANEL
CHOLESTEROL: 166 mg/dL (ref <200)
HDL: 32 mg/dL — ABNORMAL LOW (ref >50)
LDL Cholesterol (Calc): 106 mg/dL (calc) — ABNORMAL HIGH
Non-HDL Cholesterol (Calc): 134 mg/dL (calc) — ABNORMAL HIGH (ref <130)
Total CHOL/HDL Ratio: 5.2 (calc) — ABNORMAL HIGH (ref <5.0)
Triglycerides: 158 mg/dL — ABNORMAL HIGH (ref <150)

## 2017-09-10 LAB — TSH: TSH: 1.51 m[IU]/L (ref 0.40–4.50)

## 2017-09-10 LAB — MAGNESIUM: Magnesium: 2 mg/dL (ref 1.5–2.5)

## 2017-09-30 ENCOUNTER — Other Ambulatory Visit: Payer: Self-pay | Admitting: Physician Assistant

## 2017-09-30 DIAGNOSIS — E079 Disorder of thyroid, unspecified: Secondary | ICD-10-CM

## 2017-10-08 ENCOUNTER — Other Ambulatory Visit: Payer: Self-pay | Admitting: Physician Assistant

## 2017-10-08 DIAGNOSIS — Z139 Encounter for screening, unspecified: Secondary | ICD-10-CM

## 2017-10-16 DIAGNOSIS — M25562 Pain in left knee: Secondary | ICD-10-CM | POA: Diagnosis not present

## 2017-10-16 DIAGNOSIS — G8929 Other chronic pain: Secondary | ICD-10-CM | POA: Diagnosis not present

## 2017-10-16 DIAGNOSIS — M170 Bilateral primary osteoarthritis of knee: Secondary | ICD-10-CM | POA: Diagnosis not present

## 2017-10-16 DIAGNOSIS — H2513 Age-related nuclear cataract, bilateral: Secondary | ICD-10-CM | POA: Diagnosis not present

## 2017-11-03 ENCOUNTER — Other Ambulatory Visit: Payer: Self-pay | Admitting: Internal Medicine

## 2017-11-06 ENCOUNTER — Ambulatory Visit
Admission: RE | Admit: 2017-11-06 | Discharge: 2017-11-06 | Disposition: A | Discharge status: Home / Self Care | Payer: 59 | Source: Ambulatory Visit | Attending: Physician Assistant | Admitting: Physician Assistant

## 2017-11-06 DIAGNOSIS — Z1231 Encounter for screening mammogram for malignant neoplasm of breast: Secondary | ICD-10-CM | POA: Diagnosis not present

## 2017-11-06 DIAGNOSIS — Z139 Encounter for screening, unspecified: Secondary | ICD-10-CM

## 2017-11-15 DIAGNOSIS — G8929 Other chronic pain: Secondary | ICD-10-CM | POA: Diagnosis not present

## 2017-11-15 DIAGNOSIS — M25562 Pain in left knee: Secondary | ICD-10-CM | POA: Diagnosis not present

## 2017-11-20 ENCOUNTER — Encounter: Payer: Self-pay | Admitting: Physician Assistant

## 2017-11-27 DIAGNOSIS — S83209D Unspecified tear of unspecified meniscus, current injury, unspecified knee, subsequent encounter: Secondary | ICD-10-CM | POA: Diagnosis not present

## 2017-11-27 DIAGNOSIS — M1712 Unilateral primary osteoarthritis, left knee: Secondary | ICD-10-CM | POA: Diagnosis not present

## 2017-11-27 DIAGNOSIS — M25562 Pain in left knee: Secondary | ICD-10-CM | POA: Diagnosis not present

## 2017-12-11 ENCOUNTER — Other Ambulatory Visit: Payer: Self-pay | Admitting: Internal Medicine

## 2017-12-11 DIAGNOSIS — G4700 Insomnia, unspecified: Secondary | ICD-10-CM

## 2017-12-17 ENCOUNTER — Encounter: Payer: Self-pay | Admitting: Internal Medicine

## 2017-12-18 NOTE — Progress Notes (Signed)
Complete Physical  Assessment and Plan:  Essential hypertension - continue medications, DASH diet, exercise and monitor at home. Call if greater than 130/80.  - CBC with Differential/Platelet - BASIC METABOLIC PANEL WITH GFR - Hepatic function panel - Urinalysis, Routine w reflex microscopic (not at [facility]) - Microalbumin / creatinine urine ratio - EKG 12-Lead  Thyroid disease Hypothyroidism-check TSH level, continue medications the same, reminded to take on an empty stomach 30-35mins before food.  Had recent decrease in TSH and has had weight gain, will check  - TSH  Prediabetes Discussed general issues about diabetes pathophysiology and management., Educational material distributed., Suggested low cholesterol diet., Encouraged aerobic exercise., Discussed foot care., Reminded to get yearly retinal exam. - Hemoglobin A1c   Hyperlipidemia -continue medications, check lipids, decrease fatty foods, increase activity.  - Lipid panel  Vitamin D deficiency - Vit D  25 hydroxy (rtn osteoporosis monitoring)  Medication management - Magnesium   Bilateral fibrocystic breast disease (FCBD) suggest getting 3D, CATEGORY D BREAST   Nonalcoholic fatty liver disease Check labs, avoid tylenol, alcohol, weight loss advised.  - Hepatic function panel  History of nephrolithiasis Increase water  Insomnia - ALPRAZolam (XANAX) PRN  Obesity, Class II, BMI 35-39.9, with comorbidity ([place]) - follow up 6 months for progress monitoring - increase veggies, decrease carbs - long discussion about weight loss, diet, and exercise   Encounter for general adult medical examination with abnormal findings Continue 3D MGM TDAP today  Discussed med's effects and SE's. Screening labs and tests as requested with regular follow-up as recommended.   HPI 57 y.o. female  presents for a complete physical. She has had elevated blood pressure since 2000. Her blood pressure has been controlled at home, today  their BP is BP: 122/78   She does workout, walks 5 miles a day. She denies chest pain, shortness of breath, dizziness.  Has had left knee pain so not walking as much, see Dr. Tonita Cong and going to have arthroscopy on Feb 25th.  She is not on cholesterol medication and denies myalgias. Her cholesterol is at goal. The cholesterol last visit was:   Lab Results  Component Value Date   CHOL 166 09/09/2017   HDL 32 (L) 09/09/2017   LDLCALC 126 (H) 06/03/2017   TRIG 158 (H) 09/09/2017   CHOLHDL 5.2 (H) 09/09/2017    She has been working on diet and exercise for prediabetes, and denies paresthesia of the feet, polydipsia and polyuria. Last A1C in the office was:  Lab Results  Component Value Date   HGBA1C 6.0 (H) 09/09/2017   Lab Results  Component Value Date   GFRNONAA 83 09/09/2017   Patient is on Vitamin D supplement,  5000 every day.    She is on thyroid medication. Her medication was changed last visit, she is on 200 mcg daily.  Lab Results  Component Value Date   TSH 1.51 09/09/2017  .  Her BMI is Body mass index is 35.05 kg/m., she is working on diet and exercise and has done well. She was on phentermine before but was unable to sleep while taking it, belviq was not covered. She eats 3 meals a day. Wt Readings from Last 3 Encounters:  12/20/17 210 lb 9.6 oz (95.5 kg)  09/09/17 212 lb 3.2 oz (96.3 kg)  07/08/17 221 lb (100.2 kg)   Has a history of kidney stones, follows with Dr. Matilde Sprang.  Very rare trouble sleeping, takes xanax PRN if this happens.  Sister and mother passed in  Nov 2015, stroke and sudden death due heart valve disease.  Has had 3 sisters with carotid artery disease, have had stroke, 70's, upper 60's, smokers, she denies symptoms of dizziness, vision changes- explained that we will monitor and control risk factors but no need for further testing at this time. .  Current Medications:  Current Outpatient Medications on File Prior to Visit  Medication Sig  .  ALPRAZolam (XANAX) 0.5 MG tablet TAKE 1 TABLET BY MOUTH TWO  TIMES DAILY AS NEEDED FOR  ANXIETY  . aspirin 81 MG tablet Take 81 mg by mouth daily.  . cholecalciferol (VITAMIN D) 1000 UNITS tablet Take 10,000 Units by mouth daily.   Marland Kitchen diltiazem (CARDIZEM CD) 240 MG 24 hr capsule TAKE 1 CAPSULE BY MOUTH  EVERY DAY FOR BLOOD  PRESSURE  . hydrochlorothiazide (HYDRODIURIL) 25 MG tablet TAKE 1 TABLET BY MOUTH  DAILY  . Multiple Vitamin (MULTIVITAMIN WITH MINERALS) TABS tablet Take 1 tablet by mouth daily.  . Omega-3 Fatty Acids (FISH OIL PO) Take 1 capsule by mouth daily.  . quinapril (ACCUPRIL) 40 MG tablet TAKE 1 TABLET BY MOUTH  DAILY  . SYNTHROID 200 MCG tablet TAKE 1 TABLET BY MOUTH ONCE DAILY BEFORE BREAKFAST   No current facility-administered medications on file prior to visit.     Health Maintenance:   Immunization History  Administered Date(s) Administered  . Pneumococcal-Unspecified 04/10/2010  . Td 11/03/2008   Tetanus: 2009 DUE Pneumovax: 2011 Prevnar 13: age 16 Flu vaccine: declines Zostavax: N/A  Pap: 06/2014 normal neg HPV, due 2020 MGM: 11/06/2017 suggest 3D, CAT D- DUE DEXA: N/A Colonoscopy:12/ 2016 Dr. Deatra Ina, 3 years EGD: N/A CXR: DUE CT AB 2014 [place] AB 2014 fatty liver  Medical History:  Past Medical History:  Diagnosis Date  . Allergy   . Anxiety   . Arthritis    hip  . Fatty liver disease, nonalcoholic 56/3875   Via U/S  . GERD (gastroesophageal reflux disease)   . Hyperlipidemia   . Hypertension   . Insomnia   . Nephrolithiasis   . Prediabetes    no meds - diet controlled  . Thyroid disease   . Vitamin D deficiency    Allergies Allergies  Allergen Reactions  . Atenolol     Beta blockers. Pt doesn't remember there being a problem  . Codeine Nausea Only  . Klonopin [Clonazepam] Rash  . Penicillins Rash    SURGICAL HISTORY She  has a past surgical history that includes Cholecystectomy; Tubal ligation; and Lithotripsy (2016). FAMILY  HISTORY Her family history includes Heart Problems in her mother; Hypertension in her mother; Peripheral Artery Disease in her sister. SOCIAL HISTORY She  reports that she quit smoking about 24 years ago. She has a 10.00 pack-year smoking history. she has never used smokeless tobacco. She reports that she drinks about 1.2 oz of alcohol per week. She reports that she does not use drugs.  Review of Systems  Constitutional: Negative for chills, diaphoresis, fever, malaise/fatigue and weight loss.  HENT: Negative.   Eyes: Negative.   Respiratory: Negative.  Negative for cough and shortness of breath.   Cardiovascular: Negative.  Negative for chest pain.  Genitourinary: Negative.  Negative for dysuria, frequency and urgency.  Musculoskeletal: Positive for joint pain (right knee, has OA, has had shot before). Negative for back pain, falls, myalgias and neck pain.  Skin: Negative.   Neurological: Negative.  Negative for dizziness and weakness.  Psychiatric/Behavioral: Negative for depression, hallucinations, memory loss, substance abuse and  suicidal ideas. The patient has insomnia (occ will take xanax). The patient is not nervous/anxious.     Physical Exam: Estimated body mass index is 35.05 kg/m as calculated from the following:   Height as of this encounter: 5\' 5"  (1.651 m).   Weight as of this encounter: 210 lb 9.6 oz (95.5 kg). BP 122/78   Pulse 87   Temp (!) 97.5 F (36.4 C)   Resp 16   Ht 5\' 5"  (1.651 m) Comment: w/shoes  Wt 210 lb 9.6 oz (95.5 kg)   LMP 09/01/2013   SpO2 98%   BMI 35.05 kg/m  General Appearance: Well nourished, in no apparent distress. Eyes: PERRLA, EOMs, conjunctiva no swelling or erythema, normal fundi and vessels. Sinuses: No Frontal/maxillary tenderness ENT/Mouth: Ext aud canals clear, normal light reflex with TMs without erythema, bulging.  Good dentition. No erythema, swelling, or exudate on post pharynx. Tonsils not swollen or erythematous. Hearing normal.   Neck: Supple, thyroid normal. No bruits Respiratory: Respiratory effort normal, BS equal bilaterally without rales, rhonchi, wheezing or stridor. Cardio: RRR without murmurs, rubs or gallops. Brisk peripheral pulses without edema.  Chest: symmetric, with normal excursions and percussion. Breasts: Symmetric lumps bilateral breast, without nipple discharge, retractions. Abdomen: Soft, +BS. Non tender, no guarding, rebound, hernias, masses, or organomegaly.  Lymphatics: Non tender without lymphadenopathy.  Genitourinary: defer, next year Musculoskeletal: Full ROM all peripheral extremities,5/5 strength, and normal gait. Skin: Warm, dry without rashes, lesions, ecchymosis.  Neuro: Cranial nerves intact, reflexes equal bilaterally. Normal muscle tone, no cerebellar symptoms. Sensation intact.  Psych: Awake and oriented X 3, normal affect, Insight and Judgment appropriate.   EKG: WNL no changes. AORTA SCAN:  defer   Shannon Pope 10:30 AM

## 2017-12-20 ENCOUNTER — Ambulatory Visit: Payer: 59 | Admitting: Physician Assistant

## 2017-12-20 ENCOUNTER — Encounter: Payer: Self-pay | Admitting: Physician Assistant

## 2017-12-20 VITALS — BP 122/78 | HR 87 | Temp 97.5°F | Resp 16 | Ht 65.0 in | Wt 210.6 lb

## 2017-12-20 DIAGNOSIS — Z79899 Other long term (current) drug therapy: Secondary | ICD-10-CM

## 2017-12-20 DIAGNOSIS — Z6835 Body mass index (BMI) 35.0-35.9, adult: Secondary | ICD-10-CM

## 2017-12-20 DIAGNOSIS — E559 Vitamin D deficiency, unspecified: Secondary | ICD-10-CM

## 2017-12-20 DIAGNOSIS — R6889 Other general symptoms and signs: Secondary | ICD-10-CM

## 2017-12-20 DIAGNOSIS — E079 Disorder of thyroid, unspecified: Secondary | ICD-10-CM

## 2017-12-20 DIAGNOSIS — Z87442 Personal history of urinary calculi: Secondary | ICD-10-CM | POA: Diagnosis not present

## 2017-12-20 DIAGNOSIS — N6012 Diffuse cystic mastopathy of left breast: Secondary | ICD-10-CM | POA: Diagnosis not present

## 2017-12-20 DIAGNOSIS — Z130 Encounter for screening for diseases of the blood and blood-forming organs and certain disorders involving the immune mechanism: Secondary | ICD-10-CM | POA: Diagnosis not present

## 2017-12-20 DIAGNOSIS — G4700 Insomnia, unspecified: Secondary | ICD-10-CM

## 2017-12-20 DIAGNOSIS — N6011 Diffuse cystic mastopathy of right breast: Secondary | ICD-10-CM

## 2017-12-20 DIAGNOSIS — R7303 Prediabetes: Secondary | ICD-10-CM

## 2017-12-20 DIAGNOSIS — I10 Essential (primary) hypertension: Secondary | ICD-10-CM | POA: Diagnosis not present

## 2017-12-20 DIAGNOSIS — K760 Fatty (change of) liver, not elsewhere classified: Secondary | ICD-10-CM | POA: Diagnosis not present

## 2017-12-20 DIAGNOSIS — Z23 Encounter for immunization: Secondary | ICD-10-CM

## 2017-12-20 DIAGNOSIS — Z0001 Encounter for general adult medical examination with abnormal findings: Secondary | ICD-10-CM | POA: Diagnosis not present

## 2017-12-20 DIAGNOSIS — E785 Hyperlipidemia, unspecified: Secondary | ICD-10-CM | POA: Diagnosis not present

## 2017-12-20 NOTE — Patient Instructions (Addendum)
Fatty liver or Nonalcoholic fatty liver disease (NASH) is now the leading cause of liver failure in the [place]. It is normally from such risk factors as obesity, diabetes, insulin resistance, high cholesterol, or metabolic syndrome. The only definitive therapy is weight loss and exercise.  Suggest walking 20-30 mins daily.  Decreasing carbohydrates, increasing veggies.  Vitamin E 800 IU a day may be beneficial. Liver cancer has been noted in patient with fatty liver without cirrhosis.  Will monitor closely   Fatty Liver Fatty liver is the accumulation of fat in liver cells. It is also called hepatosteatosis or steatohepatitis. It is normal for your liver to contain some fat. If fat is more than 5 to 10% of your liver's weight, you have fatty liver.  There are often no symptoms (problems) for years while damage is still occurring. People often learn about their fatty liver when they have medical tests for other reasons. Fat can damage your liver for years or even decades without causing problems. When it becomes severe, it can cause fatigue, weight loss, weakness, and confusion. This makes you more likely to develop more serious liver problems. The liver is the largest organ in the body. It does a lot of work and often gives no warning signs when it is sick until late in a disease. The liver has many important jobs including:  Breaking down foods.  Storing vitamins, iron, and other minerals.  Making proteins.  Making bile for food digestion.  Breaking down many products including medications, alcohol and some poisons.  PROGNOSIS  Fatty liver may cause no damage or it can lead to an inflammation of the liver. This is, called steatohepatitis.  Over time the liver may become scarred and hardened. This condition is called cirrhosis. Cirrhosis is serious and may lead to liver failure or cancer. NASH is one of the leading causes of cirrhosis. About 10-20% of Americans have fatty liver and a  smaller 2-5% has NASH.  TREATMENT   Weight loss, fat restriction, and exercise in overweight patients produces inconsistent results but is worth trying.  Good control of diabetes may reduce fatty liver.  Eat a balanced, healthy diet.  Increase your physical activity.  There are no medical or surgical treatments for a fatty liver or NASH, but improving your diet and increasing your exercise may help prevent or reverse some of the damage.  Being dehydrated can hurt your kidneys, cause fatigue, headaches, muscle aches, joint pain, and dry skin/nails so please increase your fluids.   Drink 80-100 oz a day of water, measure it out! Eat 3 meals a day, have to do breakfast, eat protein- hard boiled eggs, protein bar like [place] protein bar, greek yogurt like oikos triple zero, chobani 100, or light n fit greek  Intermittent fasting is more about strategy than starvation. It's meant to reset your body in different ways, hopefully with fitness and nutrition changes as a result.  Like any big switchover, though, results may vary when it comes down to the individual level. What works for your friends may not work for you, or vice versa. That's why it's helpful to play around with variations on intermittent fasting and healthy habits and find what works best for you.  WHAT IS INTERMITTENT FASTING AND WHY DO IT?  Intermittent fasting doesn't involve specific foods, but rather, a strict schedule regarding when you eat. Also called "time-restricted eating," the tactic has been praised for its contribution to weight loss, improved body composition, and decreased cravings.  Preliminary research also suggests it may be beneficial for glucose tolerance, hormone regulation, better muscle mass and lower body fat.  Part of its appeal is the simplicity of the effort. Unlike some other trends, there's no calculations to intermittent fasting.  You simply eat within a certain block of time, usually a window  of 8-10 hours. In the other big block of time - about 14-16 hours, including when you're asleep - you don't eat anything, not even snacks. You can drink water, coffee, tea or any other beverage that doesn't have calories.  For example, if you like having a late dinner, you might skip breakfast and have your first meal at noon and your last meal of the day at 8 p.m., and then not eat until noon again the next day.  IDEAS FOR GETTING STARTED  If you're new to the strategy, it may be helpful to eat within the typical circadian rhythm and keep eating within daylight hours. This can be especially beneficial if you're looking at intermittent fasting for weight-loss goals.  So first try only eating between 12pm to 8pm.  Outside of this time you may have water, black coffee, and hot tea. You may not eat it drink anything that has carbs, sugars, OR artificial sugars like diet soda.   Like any major eating and fitness shift, it can take time to find the perfect fit, so don't be afraid to experiment with different options - including ditching intermittent fasting altogether if it's simply not for you. But if it is, you may be surprised by some of the benefits that come along with the strategy.  Are you an emotional eater? Do you eat more when you're feeling stressed? Do you eat when you're not hungry or when you're full? Do you eat to feel better (to calm and soothe yourself when you're sad, mad, bored, anxious, etc.)? Do you reward yourself with food? Do you regularly eat until you've stuffed yourself? Does food make you feel safe? Do you feel like food is a friend? Do you feel powerless or out of control around food?  If you answered yes to some of these questions than it is likely that you are an emotional eater. This is normally a learned behavior and can take time to first recognize the signs and second BREAK THE HABIT. But here is more information and tips to help.   The difference between  emotional hunger and physical hunger Emotional hunger can be powerful, so it's easy to mistake it for physical hunger. But there are clues you can look for to help you tell physical and emotional hunger apart.  Emotional hunger comes on suddenly. It hits you in an instant and feels overwhelming and urgent. Physical hunger, on the other hand, comes on more gradually. The urge to eat doesn't feel as dire or demand instant satisfaction (unless you haven't eaten for a very long time).  Emotional hunger craves specific comfort foods. When you're physically hungry, almost anything sounds good-including healthy stuff like vegetables. But emotional hunger craves junk food or sugary snacks that provide an instant rush. You feel like you need cheesecake or pizza, and nothing else will do.  Emotional hunger often leads to mindless eating. Before you know it, you've eaten a whole bag of chips or an entire pint of ice cream without really paying attention or fully enjoying it. When you're eating in response to physical hunger, you're typically more aware of what you're doing.  Emotional hunger isn't satisfied once  you're full. You keep wanting more and more, often eating until you're uncomfortably stuffed. Physical hunger, on the other hand, doesn't need to be stuffed. You feel satisfied when your stomach is full.  Emotional hunger isn't located in the stomach. Rather than a growling belly or a pang in your stomach, you feel your hunger as a craving you can't get out of your head. You're focused on specific textures, tastes, and smells.  Emotional hunger often leads to regret, guilt, or shame. When you eat to satisfy physical hunger, you're unlikely to feel guilty or ashamed because you're simply giving your body what it needs. If you feel guilty after you eat, it's likely because you know deep down that you're not eating for nutritional reasons.  Identify your emotional eating triggers What situations, places,  or feelings make you reach for the comfort of food? Most emotional eating is linked to unpleasant feelings, but it can also be triggered by positive emotions, such as rewarding yourself for achieving a goal or celebrating a holiday or happy event. Common causes of emotional eating include:  Stuffing emotions - Eating can be a way to temporarily silence or "stuff down" uncomfortable emotions, including anger, fear, sadness, anxiety, loneliness, resentment, and shame. While you're numbing yourself with food, you can avoid the difficult emotions you'd rather not feel.  Boredom or feelings of emptiness - Do you ever eat simply to give yourself something to do, to relieve boredom, or as a way to fill a void in your life? You feel unfulfilled and empty, and food is a way to occupy your mouth and your time. In the moment, it fills you up and distracts you from underlying feelings of purposelessness and dissatisfaction with your life.  Childhood habits - Think back to your childhood memories of food. Did your parents reward good behavior with ice cream, take you out for pizza when you got a good report card, or serve you sweets when you were feeling sad? These habits can often carry over into adulthood. Or your eating may be driven by nostalgia-for cherished memories of grilling burgers in the backyard with your dad or baking and eating cookies with your mom.  Social influences - Getting together with other people for a meal is a great way to relieve stress, but it can also lead to overeating. It's easy to overindulge simply because the food is there or because everyone else is eating. You may also overeat in social situations out of nervousness. Or perhaps your family or circle of friends encourages you to overeat, and it's easier to go along with the group.  Stress - Ever notice how stress makes you hungry? It's not just in your mind. When stress is chronic, as it so often is in our chaotic, fast-paced world,  your body produces high levels of the stress hormone, cortisol. Cortisol triggers cravings for salty, sweet, and fried foods-foods that give you a burst of energy and pleasure. The more uncontrolled stress in your life, the more likely you are to turn to food for emotional relief.  Find other ways to feed your feelings If you don't know how to manage your emotions in a way that doesn't involve food, you won't be able to control your eating habits for very long. Diets so often fail because they offer logical nutritional advice which only works if you have conscious control over your eating habits. It doesn't work when emotions hijack the process, demanding an immediate payoff with food.  In order  to stop emotional eating, you have to find other ways to fulfill yourself emotionally. It's not enough to understand the cycle of emotional eating or even to understand your triggers, although that's a huge first step. You need alternatives to food that you can turn to for emotional fulfillment.  Alternatives to emotional eating If you're depressed or lonely, call someone who always makes you feel better, play with your dog or cat, or look at a favorite photo or cherished memento.  If you're anxious, expend your nervous energy by dancing to your favorite song, squeezing a stress ball, or taking a brisk walk.  If you're exhausted, treat yourself with a hot cup of tea, take a bath, light some scented candles, or wrap yourself in a warm blanket.  If you're bored, read a good book, watch a comedy show, explore the outdoors, or turn to an activity you enjoy (woodworking, playing the guitar, shooting hoops, scrapbooking, etc.).  What is mindful eating? Mindful eating is a practice that develops your awareness of eating habits and allows you to pause between your triggers and your actions. Most emotional eaters feel powerless over their food cravings. When the urge to eat hits, you feel an almost unbearable tension  that demands to be fed, right now. Because you've tried to resist in the past and failed, you believe that your willpower just isn't up to snuff. But the truth is that you have more power over your cravings than you think.  Take 5 before you give in to a craving Emotional eating tends to be automatic and virtually mindless. Before you even realize what you're doing, you've reached for a tub of ice cream and polished off half of it. But if you can take a moment to pause and reflect when you're hit with a craving, you give yourself the opportunity to make a different decision.  Can you put off eating for five minutes? Or just start with one minute. Don't tell yourself you can't give in to the craving; remember, the forbidden is extremely tempting. Just tell yourself to wait.  While you're waiting, check in with yourself. How are you feeling? What's going on emotionally? Even if you end up eating, you'll have a better understanding of why you did it. This can help you set yourself up for a different response next time.  How to practice mindful eating Eating while you're also doing other things-such as watching TV, driving, or playing with your phone-can prevent you from fully enjoying your food. Since your mind is elsewhere, you may not feel satisfied or continue eating even though you're no longer hungry. Eating more mindfully can help focus your mind on your food and the pleasure of a meal and curb overeating.   Eat your meals in a calm place with no distractions, aside from any dining companions.  Try eating with your non-dominant hand or using chopsticks instead of a knife and fork. Eating in such a non-familiar way can slow down how fast you eat and ensure your mind stays focused on your food.  Allow yourself enough time not to have to rush your meal. Set a timer for 20 minutes and pace yourself so you spend at least that much time eating.  Take small bites and chew them well, taking time to  notice the different flavors and textures of each mouthful.  Put your utensils down between bites. Take time to consider how you feel-hungry, satiated-before picking up your utensils again.  Try to stop eating  before you are full.It takes time for the signal to reach your brain that you've had enough. Don't feel obligated to always clean your plate.  When you've finished your food, take a few moments to assess if you're really still hungry before opting for an extra serving or dessert.  Learn to accept your feelings-even the bad ones  While it may seem that the core problem is that you're powerless over food, emotional eating actually stems from feeling powerless over your emotions. You don't feel capable of dealing with your feelings head on, so you avoid them with food.  Recommended reading  Mini Habits for weight loss  Healthy Eating: A guide to the new nutrition - [place] Report  10 Tips for Mindful Eating - How mindfulness can help you fully enjoy a meal and the experience of eating-with moderation and restraint. ([place])  Weight Loss: Gain Control of Emotional Eating - Tips to regain control of your eating habits. [facility])  Why Stress Causes People to Overeat -Tips on controlling stress eating. ([place])  Mindful Eating Meditations -Free online mindfulness meditations. ([facility] [employer])    Dining out: help on how to choose  It is better if you can meal plan and eat at your house but sometimes life happens so here are some tips to help you make healthier choices while eating out:  1) Ask for your server to pack up 1/2 of your meal to take home for lunch or later. Portion sizes are huge so if you don't have all of it in front of you, you are less likely to eat it all.    2) Ask if they have a smaller portion or lunch portion available, this is often cheaper as well!  3) Ask to not have the bread  basket brought to the table  4) Ask for the dressing on the side.   5) Look at the nutrition menu BEFORE you go to a restaurant or fast food chain and have what you will eat in mind. You can also look it up on food tracking ups like Myfitness Pal or Lost it. However the web site for the restaurant is usually the best bet.   6) Don't forget that you are the costumer, it is okay to ask them to leave things out, ask about substituting, or ask them to cook with less oil!  Here is some more general information for you!

## 2017-12-21 LAB — URINALYSIS, ROUTINE W REFLEX MICROSCOPIC
Bilirubin Urine: NEGATIVE
Glucose, UA: NEGATIVE
HGB URINE DIPSTICK: NEGATIVE
KETONES UR: NEGATIVE
Leukocytes, UA: NEGATIVE
Nitrite: NEGATIVE
PROTEIN: NEGATIVE
Specific Gravity, Urine: 1.018 (ref 1.001–1.03)
pH: 6.5 (ref 5.0–8.0)

## 2017-12-21 LAB — CBC WITH DIFFERENTIAL/PLATELET
Basophils Absolute: 118 cells/uL (ref 0–200)
Basophils Relative: 1.3 %
EOS PCT: 4.4 %
Eosinophils Absolute: 400 cells/uL (ref 15–500)
HCT: 42.9 % (ref 35.0–45.0)
HEMOGLOBIN: 14.4 g/dL (ref 11.7–15.5)
Lymphs Abs: 2421 cells/uL (ref 850–3900)
MCH: 29.1 pg (ref 27.0–33.0)
MCHC: 33.6 g/dL (ref 32.0–36.0)
MCV: 86.8 fL (ref 80.0–100.0)
MONOS PCT: 8 %
MPV: 11.2 fL (ref 7.5–12.5)
NEUTROS ABS: 5433 {cells}/uL (ref 1500–7800)
Neutrophils Relative %: 59.7 %
PLATELETS: 305 10*3/uL (ref 140–400)
RBC: 4.94 10*6/uL (ref 3.80–5.10)
RDW: 13.6 % (ref 11.0–15.0)
TOTAL LYMPHOCYTE: 26.6 %
WBC mixed population: 728 cells/uL (ref 200–950)
WBC: 9.1 10*3/uL (ref 3.8–10.8)

## 2017-12-21 LAB — HEPATIC FUNCTION PANEL
AG Ratio: 1.5 (calc) (ref 1.0–2.5)
ALKALINE PHOSPHATASE (APISO): 107 U/L (ref 33–130)
ALT: 42 U/L — AB (ref 6–29)
AST: 60 U/L — ABNORMAL HIGH (ref 10–35)
Albumin: 4.2 g/dL (ref 3.6–5.1)
BILIRUBIN INDIRECT: 0.6 mg/dL (ref 0.2–1.2)
Bilirubin, Direct: 0.1 mg/dL (ref 0.0–0.2)
Globulin: 2.8 g/dL (calc) (ref 1.9–3.7)
TOTAL PROTEIN: 7 g/dL (ref 6.1–8.1)
Total Bilirubin: 0.7 mg/dL (ref 0.2–1.2)

## 2017-12-21 LAB — BASIC METABOLIC PANEL WITH GFR
BUN: 12 mg/dL (ref 7–25)
CHLORIDE: 100 mmol/L (ref 98–110)
CO2: 28 mmol/L (ref 20–32)
Calcium: 9.6 mg/dL (ref 8.6–10.4)
Creat: 0.6 mg/dL (ref 0.50–1.05)
GFR, Est African American: 116 mL/min/{1.73_m2} (ref >=60)
GFR, Est Non African American: 100 mL/min/{1.73_m2} (ref >=60)
GLUCOSE: 102 mg/dL — AB (ref 65–99)
POTASSIUM: 4.3 mmol/L (ref 3.5–5.3)
SODIUM: 136 mmol/L (ref 135–146)

## 2017-12-21 LAB — HEMOGLOBIN A1C
HEMOGLOBIN A1C: 6.4 %{Hb} — AB (ref <5.7)
Mean Plasma Glucose: 137 (calc)
eAG (mmol/L): 7.6 (calc)

## 2017-12-21 LAB — MAGNESIUM: MAGNESIUM: 1.9 mg/dL (ref 1.5–2.5)

## 2017-12-21 LAB — IRON, TOTAL/TOTAL IRON BINDING CAP
%SAT: 26 % (calc) (ref 11–50)
IRON: 89 ug/dL (ref 45–160)
TIBC: 349 ug/dL (ref 250–450)

## 2017-12-21 LAB — VITAMIN D 25 HYDROXY (VIT D DEFICIENCY, FRACTURES): VIT D 25 HYDROXY: 77 ng/mL (ref 30–100)

## 2017-12-21 LAB — LIPID PANEL
CHOL/HDL RATIO: 4.7 (calc) (ref <5.0)
Cholesterol: 177 mg/dL (ref <200)
HDL: 38 mg/dL — ABNORMAL LOW (ref >50)
LDL Cholesterol (Calc): 114 mg/dL (calc) — ABNORMAL HIGH
NON-HDL CHOLESTEROL (CALC): 139 mg/dL — AB (ref <130)
Triglycerides: 141 mg/dL (ref <150)

## 2017-12-21 LAB — MICROALBUMIN / CREATININE URINE RATIO
CREATININE, URINE: 119 mg/dL (ref 20–275)
MICROALB UR: 0.7 mg/dL
MICROALB/CREAT RATIO: 6 ug/mg{creat} (ref <30)

## 2017-12-21 LAB — VITAMIN B12: VITAMIN B 12: 341 pg/mL (ref 200–1100)

## 2017-12-21 LAB — TSH: TSH: 0.15 mIU/L — ABNORMAL LOW (ref 0.40–4.50)

## 2018-01-13 DIAGNOSIS — M23322 Other meniscus derangements, posterior horn of medial meniscus, left knee: Secondary | ICD-10-CM | POA: Diagnosis not present

## 2018-01-13 DIAGNOSIS — G8918 Other acute postprocedural pain: Secondary | ICD-10-CM | POA: Diagnosis not present

## 2018-01-13 DIAGNOSIS — S83262A Peripheral tear of lateral meniscus, current injury, left knee, initial encounter: Secondary | ICD-10-CM | POA: Diagnosis not present

## 2018-01-13 DIAGNOSIS — S83232A Complex tear of medial meniscus, current injury, left knee, initial encounter: Secondary | ICD-10-CM | POA: Diagnosis not present

## 2018-01-13 DIAGNOSIS — M23301 Other meniscus derangements, unspecified lateral meniscus, left knee: Secondary | ICD-10-CM | POA: Diagnosis not present

## 2018-01-13 DIAGNOSIS — M1712 Unilateral primary osteoarthritis, left knee: Secondary | ICD-10-CM | POA: Diagnosis not present

## 2018-01-27 DIAGNOSIS — M1712 Unilateral primary osteoarthritis, left knee: Secondary | ICD-10-CM | POA: Insufficient documentation

## 2018-01-27 DIAGNOSIS — Z9889 Other specified postprocedural states: Secondary | ICD-10-CM | POA: Insufficient documentation

## 2018-01-30 ENCOUNTER — Other Ambulatory Visit: Payer: Self-pay | Admitting: Adult Health

## 2018-01-30 DIAGNOSIS — E079 Disorder of thyroid, unspecified: Secondary | ICD-10-CM

## 2018-02-23 ENCOUNTER — Other Ambulatory Visit: Payer: Self-pay | Admitting: Physician Assistant

## 2018-04-03 ENCOUNTER — Other Ambulatory Visit: Payer: Self-pay | Admitting: Physician Assistant

## 2018-04-03 ENCOUNTER — Other Ambulatory Visit: Payer: Self-pay | Admitting: Internal Medicine

## 2018-04-03 DIAGNOSIS — G4700 Insomnia, unspecified: Secondary | ICD-10-CM

## 2018-04-03 MED ORDER — ALPRAZOLAM 0.5 MG PO TABS: ORAL_TABLET | ORAL | 0 refills | Status: DC

## 2018-04-09 ENCOUNTER — Other Ambulatory Visit: Payer: Self-pay | Admitting: Adult Health

## 2018-05-30 ENCOUNTER — Other Ambulatory Visit: Payer: Self-pay | Admitting: Internal Medicine

## 2018-05-30 DIAGNOSIS — E079 Disorder of thyroid, unspecified: Secondary | ICD-10-CM

## 2018-07-07 NOTE — Progress Notes (Signed)
Assessment and Plan:   Essential hypertension - continue medications, DASH diet, exercise and monitor at home. Call if greater than 130/80.  -     CBC with Differential/Platelet -     BASIC METABOLIC PANEL WITH GFR -     Hepatic function panel  Nonalcoholic fatty liver disease Check labs, avoid tylenol, alcohol, weight loss advised.  -     Hepatic function panel  Thyroid disease Hypothyroidism-check TSH level, continue medications the same, reminded to take on an empty stomach 30-15mins before food.  -     levothyroxine (SYNTHROID, LEVOTHROID) 150 MCG tablet; TAKE 1 TABLET BY MOUTH  DAILY BEFORE BREAKFAST, BRAND NAME ONLY, HX OF ANGEIOEDEMA TO GENERIC -     TSH -  If normal will send to mail order  Hyperlipidemia, unspecified hyperlipidemia type -continue medications, check lipids, decrease fatty foods, increase activity.  -     Hemoglobin A1c  Medication management -     Magnesium  Obesity, Class II, BMI 35-39.9, with comorbidity (HCC) diet discussed, continue low calorie diet and walking, follow up 3 months -     Lipid panel -     Hemoglobin A1c  Prediabetes Discussed general issues about diabetes pathophysiology and management., Educational material distributed., Suggested low cholesterol diet., Encouraged aerobic exercise., Discussed foot care., Reminded to get yearly retinal exam. -     Hemoglobin A1c   Continue diet and meds as discussed. Further disposition pending results of labs. Future Appointments  Date Time Provider [place]  01/01/2019  9:00 AM Vicie Mutters, PA-C [id number] None    HPI 57 y.o. female  presents for 3 month follow up with hypertension, hyperlipidemia, prediabetes and vitamin D. Her blood pressure has been controlled at home, today their BP is BP: 111/62 She does workout, walks daily, starting again.  She denies chest pain, shortness of breath, dizziness.  She is on cholesterol medication and denies myalgias. Her cholesterol is not at  goal. The cholesterol last visit was:   Lab Results  Component Value Date   CHOL 177 12/20/2017   HDL 38 (L) 12/20/2017   LDLCALC 114 (H) 12/20/2017   TRIG 141 12/20/2017   CHOLHDL 4.7 12/20/2017   She has been working on diet and exercise for diabetes has lost 12 lbs from July, and denies paresthesia of the feet, polydipsia, polyuria and visual disturbances. Last A1C in the office was:  Lab Results  Component Value Date   HGBA1C 6.4 (H) 12/20/2017   BMI is Body mass index is 36.67 kg/m., she is working on diet and exercise. Phentermine and contrave did not help. Had knee surgery and just now starting to walk again, had some weight gain.  Wt Readings from Last 3 Encounters:  07/09/18 217 lb (98.4 kg)  12/20/17 210 lb 9.6 oz (95.5 kg)  09/09/17 212 lb 3.2 oz (96.3 kg)   She is on thyroid medication, brand name due to allergy and is on 230mcg daily  Her medication was changed last visit.  Lab Results  Component Value Date   TSH 0.15 (L) 12/20/2017  .   Current Medications:  Current Outpatient Medications on File Prior to Visit  Medication Sig Dispense Refill  . ALPRAZolam (XANAX) 0.5 MG tablet Take 1/2 to 1 tablet 1 to 2 x / day ONLY if needed for Anxiety Attack & please try to limit to 5 days /week to avoid addiction 90 tablet 0  . aspirin 81 MG tablet Take 81 mg by mouth daily.    Marland Kitchen  cholecalciferol (VITAMIN D) 1000 UNITS tablet Take 10,000 Units by mouth daily.     Marland Kitchen diltiazem (CARDIZEM CD) 240 MG 24 hr capsule TAKE 1 CAPSULE BY MOUTH  EVERY DAY FOR BLOOD  PRESSURE 90 capsule 1  . hydrochlorothiazide (HYDRODIURIL) 25 MG tablet TAKE 1 TABLET BY MOUTH  DAILY 90 tablet 1  . Multiple Vitamin (MULTIVITAMIN WITH MINERALS) TABS tablet Take 1 tablet by mouth daily.    . Omega-3 Fatty Acids (FISH OIL PO) Take 1 capsule by mouth daily.    . quinapril (ACCUPRIL) 40 MG tablet TAKE 1 TABLET BY MOUTH  DAILY 90 tablet 1  . SYNTHROID 200 MCG tablet TAKE 1 TABLET BY MOUTH ONCE DAILY BEFORE  BREAKFAST 30 tablet 1   No current facility-administered medications on file prior to visit.    Medical History:  Past Medical History:  Diagnosis Date  . Allergy   . Anxiety   . Arthritis    hip  . Fatty liver disease, nonalcoholic 25/4270   Via [facility]  . GERD (gastroesophageal reflux disease)   . Hyperlipidemia   . Hypertension   . Insomnia   . Nephrolithiasis   . Prediabetes    no meds - diet controlled  . Thyroid disease   . Vitamin D deficiency    Allergies:  Allergies  Allergen Reactions  . Atenolol     Beta blockers. Pt doesn't remember there being a problem  . Codeine Nausea Only  . Klonopin [Clonazepam] Rash  . Penicillins Rash    Review of Systems:  Review of Systems  Constitutional: Negative for chills, diaphoresis, fever, malaise/fatigue and weight loss.  HENT: Negative for congestion, ear discharge, ear pain, hearing loss, nosebleeds, sore throat and tinnitus.   Eyes: Negative.   Respiratory: Negative for cough, hemoptysis, sputum production, shortness of breath, wheezing and stridor.   Cardiovascular: Negative.   Gastrointestinal: Negative.   Genitourinary: Negative.   Musculoskeletal: Negative.   Skin: Negative.   Neurological: Negative for dizziness, tingling, tremors, sensory change, speech change, focal weakness, seizures, loss of consciousness, weakness and headaches.  Psychiatric/Behavioral: Negative for depression, hallucinations, memory loss, substance abuse and suicidal ideas. The patient is not nervous/anxious and does not have insomnia.     Family history- Review and unchanged Social history- Review and unchanged Physical Exam: BP 111/62   Pulse 65   Temp 98 F (36.7 C)   Resp 16   Ht 5' 4.5" (1.638 m)   Wt 217 lb (98.4 kg)   LMP 09/01/2013   SpO2 97%   BMI 36.67 kg/m  Wt Readings from Last 3 Encounters:  07/09/18 217 lb (98.4 kg)  12/20/17 210 lb 9.6 oz (95.5 kg)  09/09/17 212 lb 3.2 oz (96.3 kg)   General Appearance: Well  nourished, in no apparent distress. Eyes: PERRLA, EOMs, conjunctiva no swelling or erythema Sinuses: + Frontal/maxillary tenderness ENT/Mouth: Ext aud canals clear, TMs without erythema, bulging. No erythema, swelling, or exudate on post pharynx.  Tonsils not swollen or erythematous. Hearing normal.  Neck: Supple, thyroid normal.  Respiratory: Respiratory effort normal, BS equal bilaterally without rales, rhonchi, wheezing or stridor.  Cardio: RRR with no MRGs. Brisk peripheral pulses without edema.  Abdomen: Soft, + BS.  Non tender, no guarding, rebound, hernias, masses. Lymphatics: Non tender without lymphadenopathy.  Musculoskeletal: Full ROM, 5/5 strength, normal gait.  Skin: Warm, dry without rashes, lesions, ecchymosis.  Neuro: Cranial nerves intact. Normal muscle tone, no cerebellar symptoms. Sensation intact.  Psych: Awake and oriented X 3, normal  affect, Insight and Judgment appropriate.    Vicie Mutters, PA-C 2:44 PM [facility]

## 2018-07-09 ENCOUNTER — Encounter: Payer: Self-pay | Admitting: Physician Assistant

## 2018-07-09 ENCOUNTER — Ambulatory Visit (INDEPENDENT_AMBULATORY_CARE_PROVIDER_SITE_OTHER): Payer: 59 | Admitting: Physician Assistant

## 2018-07-09 VITALS — BP 111/62 | HR 65 | Temp 98.0°F | Resp 16 | Ht 64.5 in | Wt 217.0 lb

## 2018-07-09 DIAGNOSIS — R7303 Prediabetes: Secondary | ICD-10-CM

## 2018-07-09 DIAGNOSIS — E079 Disorder of thyroid, unspecified: Secondary | ICD-10-CM

## 2018-07-09 DIAGNOSIS — E785 Hyperlipidemia, unspecified: Secondary | ICD-10-CM | POA: Diagnosis not present

## 2018-07-09 DIAGNOSIS — Z79899 Other long term (current) drug therapy: Secondary | ICD-10-CM

## 2018-07-09 DIAGNOSIS — I10 Essential (primary) hypertension: Secondary | ICD-10-CM

## 2018-07-09 NOTE — Patient Instructions (Signed)
Being dehydrated can hurt your kidneys, cause fatigue, headaches, muscle aches, joint pain, and dry skin/nails so please increase your fluids.   Drink 80-100 oz a day of water, measure it out! Eat 3 meals a day, have to do breakfast, eat protein- hard boiled eggs, protein bar like [place] protein bar, greek yogurt like oikos triple zero, chobani 100, or light n fit greek  Can check out plantnanny app on your phone to help you keep track of your water  Check out  Mini habits for weight loss book  2 apps for tracking food is myfitness pal  loseit OR can take picture of your food  Are you an emotional eater? Do you eat more when you're feeling stressed? Do you eat when you're not hungry or when you're full? Do you eat to feel better (to calm and soothe yourself when you're sad, mad, bored, anxious, etc.)? Do you reward yourself with food? Do you regularly eat until you've stuffed yourself? Does food make you feel safe? Do you feel like food is a friend? Do you feel powerless or out of control around food?  If you answered yes to some of these questions than it is likely that you are an emotional eater. This is normally a learned behavior and can take time to first recognize the signs and second BREAK THE HABIT. But here is more information and tips to help.   The difference between emotional hunger and physical hunger Emotional hunger can be powerful, so it's easy to mistake it for physical hunger. But there are clues you can look for to help you tell physical and emotional hunger apart.  Emotional hunger comes on suddenly. It hits you in an instant and feels overwhelming and urgent. Physical hunger, on the other hand, comes on more gradually. The urge to eat doesn't feel as dire or demand instant satisfaction (unless you haven't eaten for a very long time).  Emotional hunger craves specific comfort foods. When you're physically hungry, almost anything sounds good-including healthy  stuff like vegetables. But emotional hunger craves junk food or sugary snacks that provide an instant rush. You feel like you need cheesecake or pizza, and nothing else will do.  Emotional hunger often leads to mindless eating. Before you know it, you've eaten a whole bag of chips or an entire pint of ice cream without really paying attention or fully enjoying it. When you're eating in response to physical hunger, you're typically more aware of what you're doing.  Emotional hunger isn't satisfied once you're full. You keep wanting more and more, often eating until you're uncomfortably stuffed. Physical hunger, on the other hand, doesn't need to be stuffed. You feel satisfied when your stomach is full.  Emotional hunger isn't located in the stomach. Rather than a growling belly or a pang in your stomach, you feel your hunger as a craving you can't get out of your head. You're focused on specific textures, tastes, and smells.  Emotional hunger often leads to regret, guilt, or shame. When you eat to satisfy physical hunger, you're unlikely to feel guilty or ashamed because you're simply giving your body what it needs. If you feel guilty after you eat, it's likely because you know deep down that you're not eating for nutritional reasons.  Identify your emotional eating triggers What situations, places, or feelings make you reach for the comfort of food? Most emotional eating is linked to unpleasant feelings, but it can also be triggered by positive emotions, such as  rewarding yourself for achieving a goal or celebrating a holiday or happy event. Common causes of emotional eating include:  Stuffing emotions - Eating can be a way to temporarily silence or "stuff down" uncomfortable emotions, including anger, fear, sadness, anxiety, loneliness, resentment, and shame. While you're numbing yourself with food, you can avoid the difficult emotions you'd rather not feel.  Boredom or feelings of emptiness - Do you  ever eat simply to give yourself something to do, to relieve boredom, or as a way to fill a void in your life? You feel unfulfilled and empty, and food is a way to occupy your mouth and your time. In the moment, it fills you up and distracts you from underlying feelings of purposelessness and dissatisfaction with your life.  Childhood habits - Think back to your childhood memories of food. Did your parents reward good behavior with ice cream, take you out for pizza when you got a good report card, or serve you sweets when you were feeling sad? These habits can often carry over into adulthood. Or your eating may be driven by nostalgia-for cherished memories of grilling burgers in the backyard with your dad or baking and eating cookies with your mom.  Social influences - Getting together with other people for a meal is a great way to relieve stress, but it can also lead to overeating. It's easy to overindulge simply because the food is there or because everyone else is eating. You may also overeat in social situations out of nervousness. Or perhaps your family or circle of friends encourages you to overeat, and it's easier to go along with the group.  Stress - Ever notice how stress makes you hungry? It's not just in your mind. When stress is chronic, as it so often is in our chaotic, fast-paced world, your body produces high levels of the stress hormone, cortisol. Cortisol triggers cravings for salty, sweet, and fried foods-foods that give you a burst of energy and pleasure. The more uncontrolled stress in your life, the more likely you are to turn to food for emotional relief.  Find other ways to feed your feelings If you don't know how to manage your emotions in a way that doesn't involve food, you won't be able to control your eating habits for very long. Diets so often fail because they offer logical nutritional advice which only works if you have conscious control over your eating habits. It doesn't  work when emotions hijack the process, demanding an immediate payoff with food.  In order to stop emotional eating, you have to find other ways to fulfill yourself emotionally. It's not enough to understand the cycle of emotional eating or even to understand your triggers, although that's a huge first step. You need alternatives to food that you can turn to for emotional fulfillment.  Alternatives to emotional eating If you're depressed or lonely, call someone who always makes you feel better, play with your dog or cat, or look at a favorite photo or cherished memento.  If you're anxious, expend your nervous energy by dancing to your favorite song, squeezing a stress ball, or taking a brisk walk.  If you're exhausted, treat yourself with a hot cup of tea, take a bath, light some scented candles, or wrap yourself in a warm blanket.  If you're bored, read a good book, watch a comedy show, explore the outdoors, or turn to an activity you enjoy (woodworking, playing the guitar, shooting hoops, scrapbooking, etc.).  What is mindful eating?  Mindful eating is a practice that develops your awareness of eating habits and allows you to pause between your triggers and your actions. Most emotional eaters feel powerless over their food cravings. When the urge to eat hits, you feel an almost unbearable tension that demands to be fed, right now. Because you've tried to resist in the past and failed, you believe that your willpower just isn't up to snuff. But the truth is that you have more power over your cravings than you think.  Take 5 before you give in to a craving Emotional eating tends to be automatic and virtually mindless. Before you even realize what you're doing, you've reached for a tub of ice cream and polished off half of it. But if you can take a moment to pause and reflect when you're hit with a craving, you give yourself the opportunity to make a different decision.  Can you put off eating for  five minutes? Or just start with one minute. Don't tell yourself you can't give in to the craving; remember, the forbidden is extremely tempting. Just tell yourself to wait.  While you're waiting, check in with yourself. How are you feeling? What's going on emotionally? Even if you end up eating, you'll have a better understanding of why you did it. This can help you set yourself up for a different response next time.  How to practice mindful eating Eating while you're also doing other things-such as watching TV, driving, or playing with your phone-can prevent you from fully enjoying your food. Since your mind is elsewhere, you may not feel satisfied or continue eating even though you're no longer hungry. Eating more mindfully can help focus your mind on your food and the pleasure of a meal and curb overeating.   Eat your meals in a calm place with no distractions, aside from any dining companions.  Try eating with your non-dominant hand or using chopsticks instead of a knife and fork. Eating in such a non-familiar way can slow down how fast you eat and ensure your mind stays focused on your food.  Allow yourself enough time not to have to rush your meal. Set a timer for 20 minutes and pace yourself so you spend at least that much time eating.  Take small bites and chew them well, taking time to notice the different flavors and textures of each mouthful.  Put your utensils down between bites. Take time to consider how you feel-hungry, satiated-before picking up your utensils again.  Try to stop eating before you are full.It takes time for the signal to reach your brain that you've had enough. Don't feel obligated to always clean your plate.  When you've finished your food, take a few moments to assess if you're really still hungry before opting for an extra serving or dessert.  Learn to accept your feelings-even the bad ones  While it may seem that the core problem is that you're powerless  over food, emotional eating actually stems from feeling powerless over your emotions. You don't feel capable of dealing with your feelings head on, so you avoid them with food.  Recommended reading  Mini Habits for weight loss  Healthy Eating: A guide to the new nutrition - [employer] Report  10 Tips for Mindful Eating - How mindfulness can help you fully enjoy a meal and the experience of eating-with moderation and restraint. ([employer] blog)  Weight Loss: Gain Control of Emotional Eating - Tips to regain control  of your eating habits. [facility])  Why Stress Causes People to Overeat -Tips on controlling stress eating. ([place])  Mindful Eating Meditations -Free online mindfulness meditations. ([facility] [employer])     Dining out: help on how to choose  It is better if you can meal plan and eat at your house but sometimes life happens so here are some tips to help you make healthier choices while eating out:  1) Ask for your server to pack up 1/2 of your meal to take home for lunch or later. Portion sizes are huge so if you don't have all of it in front of you, you are less likely to eat it all.    2) Ask if they have a smaller portion or lunch portion available, this is often cheaper as well!  3) Ask to not have the bread basket brought to the table  4) Ask for the dressing on the side.   5) Look at the nutrition menu BEFORE you go to a restaurant or fast food chain and have what you will eat in mind. You can also look it up on food tracking ups like Myfitness Pal or Lost it. However the web site for the restaurant is usually the best bet.   6) Don't forget that you are the costumer, it is okay to ask them to leave things out, ask about substituting, or ask them to cook with less oil!  Here is some more general information for you!

## 2018-07-10 LAB — CBC WITH DIFFERENTIAL/PLATELET
BASOS PCT: 1.6 %
Basophils Absolute: 163 cells/uL (ref 0–200)
EOS ABS: 296 {cells}/uL (ref 15–500)
Eosinophils Relative: 2.9 %
HCT: 43.9 % (ref 35.0–45.0)
HEMOGLOBIN: 14.5 g/dL (ref 11.7–15.5)
Lymphs Abs: 2825 cells/uL (ref 850–3900)
MCH: 29.9 pg (ref 27.0–33.0)
MCHC: 33 g/dL (ref 32.0–36.0)
MCV: 90.5 fL (ref 80.0–100.0)
MPV: 11.1 fL (ref 7.5–12.5)
Monocytes Relative: 7.1 %
NEUTROS ABS: 6191 {cells}/uL (ref 1500–7800)
Neutrophils Relative %: 60.7 %
Platelets: 293 10*3/uL (ref 140–400)
RBC: 4.85 10*6/uL (ref 3.80–5.10)
RDW: 12.7 % (ref 11.0–15.0)
Total Lymphocyte: 27.7 %
WBC: 10.2 10*3/uL (ref 3.8–10.8)
WBCMIX: 724 {cells}/uL (ref 200–950)

## 2018-07-10 LAB — COMPLETE METABOLIC PANEL WITH GFR
AG Ratio: 1.3 (calc) (ref 1.0–2.5)
ALBUMIN MSPROF: 4.5 g/dL (ref 3.6–5.1)
ALT: 39 U/L — ABNORMAL HIGH (ref 6–29)
AST: 67 U/L — ABNORMAL HIGH (ref 10–35)
Alkaline phosphatase (APISO): 123 U/L (ref 33–130)
BILIRUBIN TOTAL: 0.6 mg/dL (ref 0.2–1.2)
BUN: 9 mg/dL (ref 7–25)
CALCIUM: 10.1 mg/dL (ref 8.6–10.4)
CO2: 28 mmol/L (ref 20–32)
CREATININE: 0.8 mg/dL (ref 0.50–1.05)
Chloride: 99 mmol/L (ref 98–110)
GFR, EST AFRICAN AMERICAN: 94 mL/min/{1.73_m2} (ref >=60)
GFR, EST NON AFRICAN AMERICAN: 81 mL/min/{1.73_m2} (ref >=60)
Globulin: 3.4 g/dL (calc) (ref 1.9–3.7)
Glucose, Bld: 98 mg/dL (ref 65–99)
Potassium: 4.6 mmol/L (ref 3.5–5.3)
Sodium: 138 mmol/L (ref 135–146)
TOTAL PROTEIN: 7.9 g/dL (ref 6.1–8.1)

## 2018-07-10 LAB — LIPID PANEL
CHOL/HDL RATIO: 4.9 (calc) (ref <5.0)
Cholesterol: 200 mg/dL — ABNORMAL HIGH (ref <200)
HDL: 41 mg/dL — ABNORMAL LOW (ref >50)
LDL CHOLESTEROL (CALC): 131 mg/dL — AB
NON-HDL CHOLESTEROL (CALC): 159 mg/dL — AB (ref <130)
TRIGLYCERIDES: 165 mg/dL — AB (ref <150)

## 2018-07-10 LAB — HEMOGLOBIN A1C
HEMOGLOBIN A1C: 6.5 %{Hb} — AB (ref <5.7)
Mean Plasma Glucose: 140 (calc)
eAG (mmol/L): 7.7 (calc)

## 2018-07-10 LAB — TSH: TSH: 1.24 mIU/L (ref 0.40–4.50)

## 2018-07-23 ENCOUNTER — Ambulatory Visit: Payer: 59 | Admitting: Physician Assistant

## 2018-07-23 ENCOUNTER — Encounter: Payer: Self-pay | Admitting: Physician Assistant

## 2018-07-23 VITALS — BP 140/98 | HR 78 | Temp 97.9°F | Ht 64.5 in | Wt 219.0 lb

## 2018-07-23 DIAGNOSIS — L509 Urticaria, unspecified: Secondary | ICD-10-CM

## 2018-07-23 MED ORDER — PREDNISONE 20 MG PO TABS: ORAL_TABLET | ORAL | 0 refills | Status: DC

## 2018-07-23 MED ORDER — HYDROXYZINE HCL 25 MG PO TABS: 25.0000 mg | ORAL_TABLET | Freq: Three times a day (TID) | ORAL | 0 refills | Status: DC | PRN

## 2018-07-23 NOTE — Patient Instructions (Addendum)
Pityriasis Rosea Pityriasis rosea is a rash that usually appears on the trunk of the body. It may also appear on the upper arms and upper legs. It usually begins as a single patch, and then more patches begin to develop. The rash may cause mild itching, but it normally does not cause other problems. It usually goes away without treatment. However, it may take weeks or months for the rash to go away completely. What are the causes? The cause of this condition is not known. The condition does not spread from person to person (is noncontagious). What increases the risk? This condition is more likely to develop in young adults and children. It is most common in the spring and fall. What are the signs or symptoms? The main symptom of this condition is a rash.  When more patches start to develop, they spread quickly on the trunk, back, and arms. These patches are smaller than the first one.  The patches that make up the rash are usually oval-shaped and pink or red in color. They are usually flat, but they may sometimes be raised so that they can be felt with a finger. They may also be finely crinkled and have a scaly ring around the edge.  The rash does not typically appear on areas of the skin that are exposed to the sun.  Most people who have this condition do not have other symptoms, but some have mild itching. In a few cases, a mild headache or body aches may occur before the rash appears and then go away. How is this diagnosed? Your health care provider may diagnose this condition by doing a physical exam and taking your medical history. To rule out other possible causes for the rash, the health care provider may order blood tests or take a skin sample from the rash to be looked at under a microscope. How is this treated? Usually, treatment is not needed for this condition. The rash will probably go away on its own in 4-8 weeks. In some cases, a health care provider may recommend or prescribe  medicine to reduce itching. Follow these instructions at home:  Take medicines only as directed by your health care provider.  Avoid scratching the affected areas of skin.  Do not take hot baths or use a sauna. Use only warm water when bathing or showering. Heat can increase itching. Contact a health care provider if:  Your rash does not go away in 8 weeks.  Your rash gets much worse.  You have a fever.  You have swelling or pain in the rash area.  You have fluid, blood, or pus coming from the rash area. This information is not intended to replace advice given to you by your health care provider. Make sure you discuss any questions you have with your health care provider. Document Released: 12/12/2001 Document Revised: 04/12/2016 Document Reviewed: 10/13/2014 Elsevier Interactive Patient Education  2018 [employer].    Rash A rash is a change in the color of the skin. A rash can also change the way your skin feels. There are many different conditions and factors that can cause a rash. Follow these instructions at home: Pay attention to any changes in your symptoms. Follow these instructions to help with your condition: Medicine Take or apply over-the-counter and prescription medicines only as told by your health care provider. These may include:  Corticosteroid cream.  Anti-itch lotions.  Oral antihistamines.  Skin Care  Apply cool compresses to the affected areas.  Try taking a bath with: ? Epsom salts. Follow the instructions on the packaging. You can get these at your local pharmacy or grocery store. ? Baking soda. Pour a small amount into the bath as told by your health care provider. ? Colloidal oatmeal. Follow the instructions on the packaging. You can get this at your local pharmacy or grocery store.  Try applying baking soda paste to your skin. Stir water into baking soda until it reaches a paste-like consistency.  Do not scratch or rub your skin.  Avoid  covering the rash. Make sure the rash is exposed to air as much as possible. General instructions  Avoid hot showers or baths, which can make itching worse. A cold shower may help.  Avoid scented soaps, detergents, and perfumes. Use gentle soaps, detergents, perfumes, and other cosmetic products.  Avoid any substance that causes your rash. Keep a journal to help track what causes your rash. Write down: ? What you eat. ? What cosmetic products you use. ? What you drink. ? What you wear. This includes jewelry.  Keep all follow-up visits as told by your health care provider. This is important. Contact a health care provider if:  You sweat at night.  You lose weight.  You urinate more than normal.  You feel weak.  You vomit.  Your skin or the whites of your eyes look yellow (jaundice).  Your skin: ? Tingles. ? Is numb.  Your rash: ? Does not go away after several days. ? Gets worse.  You are: ? Unusually thirsty. ? More tired than normal.  You have: ? New symptoms. ? Pain in your abdomen. ? A fever. ? Diarrhea. Get help right away if:  You develop a rash that covers all or most of your body. The rash may or may not be painful.  You develop blisters that: ? Are on top of the rash. ? Grow larger or grow together. ? Are painful. ? Are inside your nose or mouth.  You develop a rash that: ? Looks like purple pinprick-sized spots all over your body. ? Has a "bull's eye" or looks like a target. ? Is not related to sun exposure, is red and painful, and causes your skin to peel. This information is not intended to replace advice given to you by your health care provider. Make sure you discuss any questions you have with your health care provider. Document Released: 10/26/2002 Document Revised: 04/10/2016 Document Reviewed: 03/23/2015 Elsevier Interactive Patient Education  2018 [employer].

## 2018-07-23 NOTE — Progress Notes (Signed)
Subjective:    Patient ID: Shannon Pope, female    DOB: 01/21/1961, 57 y.o.   MRN: [id number]  HPI 57 y.o. obese white female presents with rash.  She woke up yesterday morning with rash that started on her legs, then spread to her chest and legs. It is not on her hands, neck or face. She states it itches and burns. Taking benadryl.   Denies specific medication, food, skin care product, detergent, soap, or other environmental triggers have been identified.  She did not experience concomitant cardiopulmonary or GI symptoms. No fever, chills.  She has no specific nasal symptom complaints today.  Blood pressure (!) 140/98, pulse 78, temperature 97.9 F (36.6 C), height 5' 4.5" (1.638 m), weight 219 lb (99.3 kg), last menstrual period 09/01/2013, SpO2 98 %.  Medications Current Outpatient Medications on File Prior to Visit  Medication Sig  . ALPRAZolam (XANAX) 0.5 MG tablet Take 1/2 to 1 tablet 1 to 2 x / day ONLY if needed for Anxiety Attack & please try to limit to 5 days /week to avoid addiction  . aspirin 81 MG tablet Take 81 mg by mouth daily.  . cholecalciferol (VITAMIN D) 1000 UNITS tablet Take 10,000 Units by mouth daily.   Marland Kitchen diltiazem (CARDIZEM CD) 240 MG 24 hr capsule TAKE 1 CAPSULE BY MOUTH  EVERY DAY FOR BLOOD  PRESSURE  . hydrochlorothiazide (HYDRODIURIL) 25 MG tablet TAKE 1 TABLET BY MOUTH  DAILY  . Multiple Vitamin (MULTIVITAMIN WITH MINERALS) TABS tablet Take 1 tablet by mouth daily.  . Omega-3 Fatty Acids (FISH OIL PO) Take 1 capsule by mouth daily.  . quinapril (ACCUPRIL) 40 MG tablet TAKE 1 TABLET BY MOUTH  DAILY  . SYNTHROID 200 MCG tablet TAKE 1 TABLET BY MOUTH ONCE DAILY BEFORE BREAKFAST   No current facility-administered medications on file prior to visit.     Problem list She has Thyroid disease; Hypertension; Hyperlipidemia; Vitamin D deficiency; Prediabetes; Medication management; Bilateral fibrocystic breast disease (FCBD); Nonalcoholic fatty liver  disease; History of nephrolithiasis; Insomnia; and Obesity on their problem list.    Review of Systems  Constitutional: Negative.  Negative for chills and fever.  HENT: Negative.   Respiratory: Negative.   Cardiovascular: Negative.   Gastrointestinal: Negative.  Negative for diarrhea.  Genitourinary: Negative.   Musculoskeletal: Negative.  Negative for arthralgias.  Skin: Positive for rash. Negative for color change and wound.  Neurological: Negative.  Negative for dizziness.       Objective:   Physical Exam  Constitutional: She is oriented to person, place, and time. She appears well-developed and well-nourished. No distress.  HENT:  Head: Normocephalic.  Right Ear: External ear normal.  Left Ear: External ear normal.  Nose: Nose normal.  Mouth/Throat: Oropharynx is clear and moist. No oropharyngeal exudate.  Eyes: Pupils are equal, round, and reactive to light. Conjunctivae are normal.  Cardiovascular: Normal rate.  Pulmonary/Chest: Breath sounds normal.  Abdominal: Soft. There is no tenderness.  Musculoskeletal: Normal range of motion.  Lymphadenopathy:    She has no cervical adenopathy.  Neurological: She is alert and oriented to person, place, and time.  Skin: Rash (urticarial rash on back, AB, arms and legs. ) noted.            Assessment & Plan:  Konnor was seen today for acute visit.  Diagnoses and all orders for this visit:  Urticarial rash -     predniSONE (DELTASONE) 20 MG tablet; 2 tablets daily for 3 days, 1 tablet  daily for 4 days. -     hydrOXYzine (ATARAX/VISTARIL) 25 MG tablet; Take 1 tablet (25 mg total) by mouth every 8 (eight) hours as needed for itching.  The patient was advised to call immediately if she has any concerning symptoms in the interval. The patient voices understanding of current treatment options and is in agreement with the current care plan.The patient knows to call the clinic with any problems, questions or concerns or go to the  ER if any further progression of symptoms.

## 2018-07-29 ENCOUNTER — Other Ambulatory Visit: Payer: Self-pay | Admitting: Internal Medicine

## 2018-07-29 DIAGNOSIS — E079 Disorder of thyroid, unspecified: Secondary | ICD-10-CM

## 2018-07-29 MED ORDER — SYNTHROID 200 MCG PO TABS: 200.0000 ug | ORAL_TABLET | Freq: Every day | ORAL | 1 refills | Status: DC

## 2018-08-27 ENCOUNTER — Other Ambulatory Visit: Payer: Self-pay | Admitting: Internal Medicine

## 2018-09-02 ENCOUNTER — Other Ambulatory Visit: Payer: Self-pay | Admitting: Internal Medicine

## 2018-09-02 DIAGNOSIS — G4700 Insomnia, unspecified: Secondary | ICD-10-CM

## 2018-10-26 ENCOUNTER — Other Ambulatory Visit: Payer: Self-pay | Admitting: Internal Medicine

## 2018-11-21 ENCOUNTER — Other Ambulatory Visit: Payer: Self-pay | Admitting: Physician Assistant

## 2018-11-21 DIAGNOSIS — G4700 Insomnia, unspecified: Secondary | ICD-10-CM

## 2019-01-01 ENCOUNTER — Encounter: Payer: Self-pay | Admitting: Physician Assistant

## 2019-01-08 ENCOUNTER — Encounter: Payer: Self-pay | Admitting: Gastroenterology

## 2019-01-14 ENCOUNTER — Other Ambulatory Visit: Payer: Self-pay | Admitting: Internal Medicine

## 2019-01-14 DIAGNOSIS — H2513 Age-related nuclear cataract, bilateral: Secondary | ICD-10-CM | POA: Diagnosis not present

## 2019-01-14 DIAGNOSIS — I10 Essential (primary) hypertension: Secondary | ICD-10-CM

## 2019-01-14 DIAGNOSIS — G4700 Insomnia, unspecified: Secondary | ICD-10-CM

## 2019-01-17 ENCOUNTER — Other Ambulatory Visit: Payer: Self-pay | Admitting: Internal Medicine

## 2019-01-17 DIAGNOSIS — E079 Disorder of thyroid, unspecified: Secondary | ICD-10-CM

## 2019-01-17 DIAGNOSIS — I10 Essential (primary) hypertension: Secondary | ICD-10-CM

## 2019-01-17 MED ORDER — QUINAPRIL HCL 40 MG PO TABS: 40.0000 mg | ORAL_TABLET | Freq: Every day | ORAL | 0 refills | Status: DC

## 2019-01-17 MED ORDER — SYNTHROID 200 MCG PO TABS: 200.0000 ug | ORAL_TABLET | Freq: Every day | ORAL | 0 refills | Status: DC

## 2019-01-17 MED ORDER — HYDROCHLOROTHIAZIDE 25 MG PO TABS: 25.0000 mg | ORAL_TABLET | Freq: Every day | ORAL | 0 refills | Status: DC

## 2019-01-17 MED ORDER — DILTIAZEM HCL ER COATED BEADS 240 MG PO CP24: ORAL_CAPSULE | ORAL | 0 refills | Status: DC

## 2019-01-19 ENCOUNTER — Encounter: Payer: Self-pay | Admitting: Gastroenterology

## 2019-01-26 ENCOUNTER — Encounter: Payer: Self-pay | Admitting: Internal Medicine

## 2019-01-29 ENCOUNTER — Other Ambulatory Visit: Payer: Self-pay

## 2019-01-29 DIAGNOSIS — E079 Disorder of thyroid, unspecified: Secondary | ICD-10-CM

## 2019-01-29 MED ORDER — SYNTHROID 200 MCG PO TABS: ORAL_TABLET | ORAL | 0 refills | Status: DC

## 2019-02-03 ENCOUNTER — Other Ambulatory Visit: Payer: Self-pay | Admitting: Internal Medicine

## 2019-02-03 DIAGNOSIS — G4700 Insomnia, unspecified: Secondary | ICD-10-CM

## 2019-02-18 ENCOUNTER — Encounter: Payer: 59 | Admitting: Gastroenterology

## 2019-02-23 ENCOUNTER — Other Ambulatory Visit: Payer: Self-pay

## 2019-02-23 DIAGNOSIS — E079 Disorder of thyroid, unspecified: Secondary | ICD-10-CM

## 2019-02-23 MED ORDER — SYNTHROID 200 MCG PO TABS: ORAL_TABLET | ORAL | 0 refills | Status: DC

## 2019-02-23 NOTE — Progress Notes (Deleted)
Complete Physical  Assessment and Plan:  Essential hypertension - continue medications, DASH diet, exercise and monitor at home. Call if greater than 130/80.  - CBC with Differential/Platelet - BASIC METABOLIC PANEL WITH GFR - Hepatic function panel - Urinalysis, Routine w reflex microscopic (not at [facility]) - Microalbumin / creatinine urine ratio - EKG 12-Lead  Thyroid disease Hypothyroidism-check TSH level, continue medications the same, reminded to take on an empty stomach 30-26mins before food.  Had recent decrease in TSH and has had weight gain, will check  - TSH  Prediabetes Discussed general issues about diabetes pathophysiology and management., Educational material distributed., Suggested low cholesterol diet., Encouraged aerobic exercise., Discussed foot care., Reminded to get yearly retinal exam. - Hemoglobin A1c   Hyperlipidemia -continue medications, check lipids, decrease fatty foods, increase activity.  - Lipid panel  Vitamin D deficiency - Vit D  25 hydroxy (rtn osteoporosis monitoring)  Medication management - Magnesium   Bilateral fibrocystic breast disease (FCBD) suggest getting 3D, CATEGORY D BREAST   Nonalcoholic fatty liver disease Check labs, avoid tylenol, alcohol, weight loss advised.  - Hepatic function panel  History of nephrolithiasis Increase water  Insomnia - ALPRAZolam (XANAX) PRN  Obesity, Class II, BMI 35-39.9, with comorbidity ([place]) - follow up 6 months for progress monitoring - increase veggies, decrease carbs - long discussion about weight loss, diet, and exercise   Encounter for general adult medical examination with abnormal findings Continue 3D MGM TDAP today  Discussed med's effects and SE's. Screening labs and tests as requested with regular follow-up as recommended.   HPI 58 y.o. female  presents for a complete physical. She has had elevated blood pressure since 2000. Her blood pressure has been controlled at home, today  their BP is     She does workout, walks 5 miles a day. She denies chest pain, shortness of breath, dizziness.  Has had left knee pain so not walking as much, see Dr. Tonita Cong and going to have arthroscopy on Feb 25th.  She is not on cholesterol medication and denies myalgias. Her cholesterol is at goal. The cholesterol last visit was:   Lab Results  Component Value Date   CHOL 200 (H) 07/09/2018   HDL 41 (L) 07/09/2018   LDLCALC 131 (H) 07/09/2018   TRIG 165 (H) 07/09/2018   CHOLHDL 4.9 07/09/2018    She has been working on diet and exercise for prediabetes, and denies paresthesia of the feet, polydipsia and polyuria. Last A1C in the office was:  Lab Results  Component Value Date   HGBA1C 6.5 (H) 07/09/2018   Lab Results  Component Value Date   GFRNONAA 81 07/09/2018   Patient is on Vitamin D supplement,  5000 every day.    She is on thyroid medication. Her medication was changed last visit, she is on 200 mcg daily.  Lab Results  Component Value Date   TSH 1.24 07/09/2018  .  Her BMI is There is no height or weight on file to calculate BMI., she is working on diet and exercise and has done well. She was on phentermine before but was unable to sleep while taking it, belviq was not covered. She eats 3 meals a day. Wt Readings from Last 3 Encounters:  07/23/18 219 lb (99.3 kg)  07/09/18 217 lb (98.4 kg)  12/20/17 210 lb 9.6 oz (95.5 kg)   Has a history of kidney stones, follows with Dr. Matilde Sprang.  Very rare trouble sleeping, takes xanax PRN if this happens.  Sister and  mother passed in Nov 2015, stroke and sudden death due heart valve disease.  Has had 3 sisters with carotid artery disease, have had stroke, 70's, upper 60's, smokers, she denies symptoms of dizziness, vision changes- explained that we will monitor and control risk factors but no need for further testing at this time. .  Current Medications:  Current Outpatient Medications on File Prior to Visit  Medication Sig  .  ALPRAZolam (XANAX) 0.5 MG tablet Take 1/2-1 tablet 1-2 x /day ONLY if needed for Panic or Anxiety Attack &  limit to 5 days /week to avoid addiction  . aspirin 81 MG tablet Take 81 mg by mouth daily.  . cholecalciferol (VITAMIN D) 1000 UNITS tablet Take 10,000 Units by mouth daily.   Marland Kitchen diltiazem (CARDIZEM CD) 240 MG 24 hr capsule TAKE 1 CAPSULE  EVERY DAY FOR BLOOD  PRESSURE  . hydrochlorothiazide (HYDRODIURIL) 25 MG tablet Take 1 tablet (25 mg total) by mouth daily.  . hydrOXYzine (ATARAX/VISTARIL) 25 MG tablet Take 1 tablet (25 mg total) by mouth every 8 (eight) hours as needed for itching.  . Multiple Vitamin (MULTIVITAMIN WITH MINERALS) TABS tablet Take 1 tablet by mouth daily.  . Omega-3 Fatty Acids (FISH OIL PO) Take 1 capsule by mouth daily.  . quinapril (ACCUPRIL) 40 MG tablet Take 1 tablet (40 mg total) by mouth daily.  Marland Kitchen SYNTHROID 200 MCG tablet Take 1 tablet daily on an empty stomach with only water for 30 minutes & no Antacid meds/Calcium/Magnesium for 4 hrs & avoid Biotin   No current facility-administered medications on file prior to visit.     Health Maintenance:   Immunization History  Administered Date(s) Administered  . Pneumococcal-Unspecified 04/10/2010  . Td 11/03/2008  . Tdap 12/20/2017   Tetanus: 2009 DUE Pneumovax: 2011 Prevnar 13: age 37 Flu vaccine: declines Zostavax: N/A  Pap: 06/2014 normal neg HPV, due 2020 MGM: 11/06/2017 suggest 3D, CAT D- DUE DEXA: N/A Colonoscopy:12/ 2016 Dr. Deatra Ina, 3 years EGD: N/A CXR: DUE CT AB 2014 [place] AB 2014 fatty liver  Medical History:  Past Medical History:  Diagnosis Date  . Allergy   . Anxiety   . Arthritis    hip  . Fatty liver disease, nonalcoholic 81/1914   Via U/S  . GERD (gastroesophageal reflux disease)   . Hyperlipidemia   . Hypertension   . Insomnia   . Nephrolithiasis   . Prediabetes    no meds - diet controlled  . Thyroid disease   . Vitamin D deficiency    Allergies Allergies  Allergen  Reactions  . Atenolol     Beta blockers. Pt doesn't remember there being a problem  . Codeine Nausea Only  . Klonopin [Clonazepam] Rash  . Penicillins Rash    SURGICAL HISTORY She  has a past surgical history that includes Cholecystectomy; Tubal ligation; and Lithotripsy (2016). FAMILY HISTORY Her family history includes Heart Problems in her mother; Hypertension in her mother; Peripheral Artery Disease in her sister. SOCIAL HISTORY She  reports that she quit smoking about 25 years ago. She has a 10.00 pack-year smoking history. She has never used smokeless tobacco. She reports current alcohol use of about 2.0 standard drinks of alcohol per week. She reports that she does not use drugs.  Review of Systems  Constitutional: Negative for chills, diaphoresis, fever, malaise/fatigue and weight loss.  HENT: Negative.   Eyes: Negative.   Respiratory: Negative.  Negative for cough and shortness of breath.   Cardiovascular: Negative.  Negative for  chest pain.  Genitourinary: Negative.  Negative for dysuria, frequency and urgency.  Musculoskeletal: Positive for joint pain (right knee, has OA, has had shot before). Negative for back pain, falls, myalgias and neck pain.  Skin: Negative.   Neurological: Negative.  Negative for dizziness and weakness.  Psychiatric/Behavioral: Negative for depression, hallucinations, memory loss, substance abuse and suicidal ideas. The patient has insomnia (occ will take xanax). The patient is not nervous/anxious.     Physical Exam: Estimated body mass index is 37.01 kg/m as calculated from the following:   Height as of 07/23/18: 5' 4.5" (1.638 m).   Weight as of 07/23/18: 219 lb (99.3 kg). LMP 09/01/2013  General Appearance: Well nourished, in no apparent distress. Eyes: PERRLA, EOMs, conjunctiva no swelling or erythema, normal fundi and vessels. Sinuses: No Frontal/maxillary tenderness ENT/Mouth: Ext aud canals clear, normal light reflex with TMs without  erythema, bulging.  Good dentition. No erythema, swelling, or exudate on post pharynx. Tonsils not swollen or erythematous. Hearing normal.  Neck: Supple, thyroid normal. No bruits Respiratory: Respiratory effort normal, BS equal bilaterally without rales, rhonchi, wheezing or stridor. Cardio: RRR without murmurs, rubs or gallops. Brisk peripheral pulses without edema.  Chest: symmetric, with normal excursions and percussion. Breasts: Symmetric lumps bilateral breast, without nipple discharge, retractions. Abdomen: Soft, +BS. Non tender, no guarding, rebound, hernias, masses, or organomegaly.  Lymphatics: Non tender without lymphadenopathy.  Genitourinary: defer, next year Musculoskeletal: Full ROM all peripheral extremities,5/5 strength, and normal gait. Skin: Warm, dry without rashes, lesions, ecchymosis.  Neuro: Cranial nerves intact, reflexes equal bilaterally. Normal muscle tone, no cerebellar symptoms. Sensation intact.  Psych: Awake and oriented X 3, normal affect, Insight and Judgment appropriate.   EKG: WNL no changes. AORTA SCAN:  defer   Vicie Mutters 12:57 PM

## 2019-02-24 ENCOUNTER — Encounter: Payer: Self-pay | Admitting: Physician Assistant

## 2019-03-30 ENCOUNTER — Other Ambulatory Visit: Payer: Self-pay | Admitting: Internal Medicine

## 2019-06-28 ENCOUNTER — Other Ambulatory Visit: Payer: Self-pay | Admitting: Internal Medicine

## 2019-06-28 DIAGNOSIS — G4700 Insomnia, unspecified: Secondary | ICD-10-CM

## 2019-07-06 ENCOUNTER — Encounter: Payer: Self-pay | Admitting: Gastroenterology

## 2019-07-06 ENCOUNTER — Other Ambulatory Visit: Payer: Self-pay | Admitting: Internal Medicine

## 2019-07-06 DIAGNOSIS — Z1231 Encounter for screening mammogram for malignant neoplasm of breast: Secondary | ICD-10-CM

## 2019-07-06 DIAGNOSIS — E079 Disorder of thyroid, unspecified: Secondary | ICD-10-CM

## 2019-07-06 MED ORDER — SYNTHROID 200 MCG PO TABS: ORAL_TABLET | ORAL | 0 refills | Status: DC

## 2019-07-15 NOTE — Progress Notes (Signed)
Complete Physical  Assessment and Plan:  Abnormal EKG Mild new ST depression concordant leads V3-V5/6 Strong family history of MI DM, obese, not willing to be on statin with Chol 130's No symptoms but patient's activity is limited by knee pain.  Will refer to cardiology for evaluation/coronary calcium score.    Essential hypertension - continue medications, DASH diet, exercise and monitor at home. Call if greater than 130/80.  - CBC with Differential/Platelet - BASIC METABOLIC PANEL WITH GFR - Hepatic function panel - Urinalysis, Routine w reflex microscopic (not at [facility]) - Microalbumin / creatinine urine ratio - EKG 12-Lead  Thyroid disease Hypothyroidism-check TSH level, continue medications the same, reminded to take on an empty stomach 30-55mins before food.  - TSH Can not tolerate generic, will send to mail order pharmacy for brand name  Prediabetes- in DM range last visit Discussed general issues about diabetes pathophysiology and management., Educational material distributed., Suggested low cholesterol diet., Encouraged aerobic exercise., Discussed foot care., Reminded to get yearly retinal exam. - Hemoglobin A1c   Hyperlipidemia -continue medications, check lipids, decrease fatty foods, increase activity.  Declines medications, strong family history May benefit from coronary calcium score and statin - Lipid panel  Vitamin D deficiency - Vit D  25 hydroxy (rtn osteoporosis monitoring)  Medication management - Magnesium   Bilateral fibrocystic breast disease (FCBD) suggest getting 3D, CATEGORY D BREAST Has appointment   Nonalcoholic fatty liver disease Check labs, avoid tylenol, alcohol, weight loss advised.  - Hepatic function panel  History of nephrolithiasis Increase water  Insomnia - ALPRAZolam (XANAX) PRN  Obesity, Class II, BMI 35-39.9, with comorbidity ([place]) - follow up 3 months for progress monitoring - increase veggies, decrease carbs - long  discussion about weight loss, diet, and exercise   Encounter for general adult medical examination with abnormal findings Continue 3D MGM 1 year  Vitamin D deficiency -     VITAMIN D 25 Hydroxy (Vit-D Deficiency, Fractures)  Medication management -     Magnesium  Screening, anemia, deficiency, iron -     Iron,Total/Total Iron Binding Cap -     Vitamin B12  Screening for cervical cancer -     Herpes I and II, IgM  Yeast dermatitis/vaginal atropy -     clotrimazole-betamethasone (LOTRISONE) cream; Apply 1 application topically 2 (two) times daily.     Discussed med's effects and SE's. Screening labs and tests as requested with regular follow-up as recommended.  Future Appointments  Date Time Provider [place]  07/29/2019  2:30 PM [facility] PREVISIT RM50 [facility]  08/12/2019 11:00 AM Armbruster, Carlota Raspberry, MD [facility]  08/21/2019  3:30 PM [facility] MM 2 [facility]  07/19/2020  3:00 PM Vicie Mutters, PA-C [facility] None     HPI 58 y.o. female  presents for a complete physical. She has had elevated blood pressure since 2000. Her blood pressure has been controlled at home, today their BP is BP: 124/76   BMI is Body mass index is 36.73 kg/m., she is working on diet and exercise. Could not tolerate phentermine.  Wt Readings from Last 3 Encounters:  07/20/19 214 lb (97.1 kg)  07/23/18 219 lb (99.3 kg)  07/09/18 217 lb (98.4 kg)   She does workout, walks 5 miles a day. She denies chest pain, shortness of breath, dizziness.  Has had left knee pain so not walking as much, see Dr. Tonita Cong and going to have arthroscopy on Feb 25th.  She is not on cholesterol medication and denies  myalgias. Her cholesterol is at goal. The cholesterol last visit was:   Lab Results  Component Value Date   CHOL 200 (H) 07/09/2018   HDL 41 (L) 07/09/2018   LDLCALC 131 (H) 07/09/2018   TRIG 165 (H) 07/09/2018   CHOLHDL 4.9 07/09/2018    She has been working on diet  and exercise for prediabetes, and denies paresthesia of the feet, polydipsia and polyuria. Last A1C in the office was:  Lab Results  Component Value Date   HGBA1C 6.5 (H) 07/09/2018   Lab Results  Component Value Date   GFRNONAA 81 07/09/2018   Patient is on Vitamin D supplement,  5000 every day.    She is on thyroid medication. Her medication was changed last visit, she is on 200 mcg daily. Had an allergic reactions to the generic synthroid, her insurance is not covering the brand name. Will send directly to synthroid.  Lab Results  Component Value Date   TSH 1.24 07/09/2018  .  Has a history of kidney stones, follows with Dr. Matilde Sprang.  Very rare trouble sleeping, takes xanax PRN if this happens.  Sister and mother passed in 10-26-2014, stroke and sudden death due heart valve disease.  Has had 3 sisters with carotid artery disease, have had stroke, 70's, upper 60's, smokers, she denies symptoms of dizziness, vision changes- explained that we will monitor and control risk factors but no need for further testing at this time. .  Current Medications:  Current Outpatient Medications on File Prior to Visit  Medication Sig  . ALPRAZolam (XANAX) 0.5 MG tablet TAKE 1/2 TO 1 TABLET BY  MOUTH ONCE TO TWICE DAILY  AS NEEDED FOR PANIC OR  ANXIETY ATTACK, LIMIT USE  TO 5 DAYS A WEEK TO AVOID  ADDICTION.  Marland Kitchen aspirin 81 MG tablet Take 81 mg by mouth daily.  . cholecalciferol (VITAMIN D) 1000 UNITS tablet Take 10,000 Units by mouth daily.   Marland Kitchen diltiazem (CARDIZEM CD) 240 MG 24 hr capsule Take 1 capsule Daily for BP  . hydrochlorothiazide (HYDRODIURIL) 25 MG tablet Take 1 tablet Daily for BP & Fluid  . hydrOXYzine (ATARAX/VISTARIL) 25 MG tablet Take 1 tablet (25 mg total) by mouth every 8 (eight) hours as needed for itching.  . Multiple Vitamin (MULTIVITAMIN WITH MINERALS) TABS tablet Take 1 tablet by mouth daily.  . Omega-3 Fatty Acids (FISH OIL PO) Take 1 capsule by mouth daily.  . quinapril  (ACCUPRIL) 40 MG tablet Take 1 tablet (40 mg total) by mouth daily.  Marland Kitchen SYNTHROID 200 MCG tablet Take 1 tablet daily on an empty stomach with only water for 30 minutes & no Antacid meds/Calcium/Magnesium for 4 hrs & avoid Biotin   No current facility-administered medications on file prior to visit.     Health Maintenance:   Immunization History  Administered Date(s) Administered  . Pneumococcal-Unspecified 04/10/2010  . Td 11/03/2008  . Tdap 12/20/2017   Tetanus: 2019 Pneumovax: 2011 Prevnar 13: age 84 Flu vaccine: will get in 10/27/23 this year Zostavax: N/A  Pap: 06/2014 normal neg HPV, due 2020 TODAY MGM: 11/06/2017 suggest 3D, CAT D- has appointment for Oct 2nd DEXA: N/A Colonoscopy:12/ 2016 Dr. Deatra Ina Dr. Havery Moros, has OV Sept.  EGD: N/A CXR: DUE CT AB 2014 [place] AB 2014 fatty liver  Medical History:  Past Medical History:  Diagnosis Date  . Allergy   . Anxiety   . Arthritis    hip  . Fatty liver disease, nonalcoholic 99991111   Via U/S  .  GERD (gastroesophageal reflux disease)   . Hyperlipidemia   . Hypertension   . Insomnia   . Nephrolithiasis   . Prediabetes    no meds - diet controlled  . Thyroid disease   . Vitamin D deficiency    Allergies Allergies  Allergen Reactions  . Atenolol     Beta blockers. Pt doesn't remember there being a problem  . Codeine Nausea Only  . Klonopin [Clonazepam] Rash  . Penicillins Rash    SURGICAL HISTORY She  has a past surgical history that includes Cholecystectomy; Tubal ligation; and Lithotripsy (2016). FAMILY HISTORY Her family history includes Heart Problems in her mother; Hypertension in her mother; Peripheral Artery Disease in her sister. SOCIAL HISTORY She  reports that she quit smoking about 25 years ago. She has a 10.00 pack-year smoking history. She has never used smokeless tobacco. She reports current alcohol use of about 2.0 standard drinks of alcohol per week. She reports that she does not use  drugs.  Review of Systems  Constitutional: Negative for chills, diaphoresis, fever, malaise/fatigue and weight loss.  HENT: Negative.   Eyes: Negative.   Respiratory: Negative.  Negative for cough and shortness of breath.   Cardiovascular: Negative.  Negative for chest pain.  Genitourinary: Negative.  Negative for dysuria, frequency and urgency.  Musculoskeletal: Positive for joint pain (right knee, has OA, has had shot before). Negative for back pain, falls, myalgias and neck pain.  Skin: Negative.   Neurological: Negative.  Negative for dizziness and weakness.  Psychiatric/Behavioral: Negative for depression, hallucinations, memory loss, substance abuse and suicidal ideas. The patient has insomnia (occ will take xanax). The patient is not nervous/anxious.     Physical Exam: Estimated body mass index is 36.73 kg/m as calculated from the following:   Height as of this encounter: 5\' 4"  (1.626 m).   Weight as of this encounter: 214 lb (97.1 kg). BP 124/76   Pulse 74   Temp (!) 97.5 F (36.4 C)   Resp 14   Ht 5\' 4"  (1.626 m)   Wt 214 lb (97.1 kg)   LMP 09/01/2013   SpO2 96%   BMI 36.73 kg/m  General Appearance: Well nourished, in no apparent distress. Eyes: PERRLA, EOMs, conjunctiva no swelling or erythema, normal fundi and vessels. Sinuses: No Frontal/maxillary tenderness ENT/Mouth: Ext aud canals clear, normal light reflex with TMs without erythema, bulging.  Good dentition. No erythema, swelling, or exudate on post pharynx. Tonsils not swollen or erythematous. Hearing normal.  Neck: Supple, thyroid normal. No bruits Respiratory: Respiratory effort normal, BS equal bilaterally without rales, rhonchi, wheezing or stridor. Cardio: RRR without murmurs, rubs or gallops. Brisk peripheral pulses without edema.  Chest: symmetric, with normal excursions and percussion. Breasts: Symmetric lumps bilateral breast, without nipple discharge, retractions. Abdomen: Soft, +BS. Non tender, no  guarding, rebound, hernias, masses, or organomegaly.  Lymphatics: Non tender without lymphadenopathy.  Genitourinary: VULVA: normal appearing vulva with no masses, tenderness or lesions, VAGINA: atrophic, vaginal erythema. Musculoskeletal: Full ROM all peripheral extremities,5/5 strength, and normal gait. Skin: Warm, dry without rashes, lesions, ecchymosis.  Neuro: Cranial nerves intact, reflexes equal bilaterally. Normal muscle tone, no cerebellar symptoms. Sensation intact.  Psych: Awake and oriented X 3, normal affect, Insight and Judgment appropriate.   EKG: Rate 69, QTc 480, new non specific ST depression V3-V6 AORTA SCAN:  defer   Vicie Mutters 3:10 PM

## 2019-07-20 ENCOUNTER — Ambulatory Visit: Payer: 59 | Admitting: Physician Assistant

## 2019-07-20 ENCOUNTER — Other Ambulatory Visit: Payer: Self-pay

## 2019-07-20 ENCOUNTER — Encounter: Payer: Self-pay | Admitting: Physician Assistant

## 2019-07-20 VITALS — BP 124/76 | HR 74 | Temp 97.5°F | Resp 14 | Ht 64.0 in | Wt 214.0 lb

## 2019-07-20 DIAGNOSIS — R7309 Other abnormal glucose: Secondary | ICD-10-CM

## 2019-07-20 DIAGNOSIS — Z124 Encounter for screening for malignant neoplasm of cervix: Secondary | ICD-10-CM

## 2019-07-20 DIAGNOSIS — B372 Candidiasis of skin and nail: Secondary | ICD-10-CM

## 2019-07-20 DIAGNOSIS — Z130 Encounter for screening for diseases of the blood and blood-forming organs and certain disorders involving the immune mechanism: Secondary | ICD-10-CM

## 2019-07-20 DIAGNOSIS — E079 Disorder of thyroid, unspecified: Secondary | ICD-10-CM

## 2019-07-20 DIAGNOSIS — E559 Vitamin D deficiency, unspecified: Secondary | ICD-10-CM

## 2019-07-20 DIAGNOSIS — I10 Essential (primary) hypertension: Secondary | ICD-10-CM | POA: Diagnosis not present

## 2019-07-20 DIAGNOSIS — Z0001 Encounter for general adult medical examination with abnormal findings: Secondary | ICD-10-CM

## 2019-07-20 DIAGNOSIS — E785 Hyperlipidemia, unspecified: Secondary | ICD-10-CM

## 2019-07-20 DIAGNOSIS — Z136 Encounter for screening for cardiovascular disorders: Secondary | ICD-10-CM

## 2019-07-20 DIAGNOSIS — Z0000 Encounter for general adult medical examination without abnormal findings: Secondary | ICD-10-CM

## 2019-07-20 DIAGNOSIS — G4700 Insomnia, unspecified: Secondary | ICD-10-CM

## 2019-07-20 DIAGNOSIS — K760 Fatty (change of) liver, not elsewhere classified: Secondary | ICD-10-CM

## 2019-07-20 DIAGNOSIS — R9431 Abnormal electrocardiogram [ECG] [EKG]: Secondary | ICD-10-CM

## 2019-07-20 DIAGNOSIS — Z79899 Other long term (current) drug therapy: Secondary | ICD-10-CM

## 2019-07-20 DIAGNOSIS — N6011 Diffuse cystic mastopathy of right breast: Secondary | ICD-10-CM

## 2019-07-20 DIAGNOSIS — Z87442 Personal history of urinary calculi: Secondary | ICD-10-CM

## 2019-07-20 MED ORDER — CLOTRIMAZOLE-BETAMETHASONE 1-0.05 % EX CREA: 1.0000 "application " | TOPICAL_CREAM | Freq: Two times a day (BID) | CUTANEOUS | 2 refills | Status: DC

## 2019-07-20 MED ORDER — SYNTHROID 200 MCG PO TABS: ORAL_TABLET | ORAL | 1 refills | Status: DC

## 2019-07-20 NOTE — Patient Instructions (Addendum)
VAGINAL DRYNESS OVERVIEW  Vaginal dryness, also known as atrophic vaginitis, is a common condition in postmenopausal women. This condition is also common in women who have had both ovaries removed at the time of hysterectomy.   Some women have uncomfortable symptoms of vaginal dryness, such as pain with sex, burning vaginal discomfort or itching, or abnormal vaginal discharge, while others have no symptoms at all.  VAGINAL DRYNESS CAUSES   Estrogen helps to keep the vagina moist and to maintain thickness of the vaginal lining. Vaginal dryness occurs when the ovaries produce a decreased amount of estrogen. This can occur at certain times in a woman's life, and may be permanent or temporary. Times when less estrogen is made include: ?At the time of menopause. ?After surgical removal of the ovaries, chemotherapy, or radiation therapy of the pelvis for cancer. ?After having a baby, particularly in women who breastfeed. ?While using certain medications, such as danazol, medroxyprogesterone (brand names: Provera or DepoProvera), leuprolide (brand name: Lupron), or nafarelin. When these medications are stopped, estrogen production resumes.  Women who smoke cigarettes have been shown to have an increased risk of an earlier menopause transition as compared to non-smokers. Therefore, atrophic vaginitis symptoms may appear at a younger age in this population.  VAGINAL DRYNESS TREATMENT   There are three treatment options for women with vaginal dryness:  Vaginal lubricants and moisturizers - Vaginal lubricants and moisturizers can be purchased without a prescription. These products do not contain any hormones and have virtually no side effects. - Albolene is found in the facial cleanser section at [facility], [facility], or [employer]. It is a large jar with a blue top. This is the best lubricant for women because it is hypoallergenic. -Natural lubricants, such as olive, avocado or peanut oil, are easily available  products that may be used as a lubricant with sex.  -Vaginal moisturizes (eg, Replens, Moist Again, Vagisil, K-Y Silk-E, and Feminease) are formulated to allow water to be retained in the vaginal tissues. Moisturizers are applied into the vagina three times weekly to allow a continued moisturizing effect. These should not be used just before having sex, as they can be irritating.  Vaginal estrogen - Vaginal estrogen is the most effective treatment option for women with vaginal dryness. Vaginal estrogen must be prescribed by a healthcare provider. Very low doses of vaginal estrogen can be used when it is put into the vagina to treat vaginal dryness. A small amount of estrogen is absorbed into the bloodstream, but only about 100 times less than when using estrogen pills or tablets. As a result, there is a much lower risk of side effects, such as blood clots, breast cancer, and heart attack, compared with other estrogen-containing products (birth control pills, menopausal hormone therapy).   Ospemifene - Ospemifene is a prescription medication that is similar to estrogen, but is not estrogen. In the vaginal tissue, it acts similarly to estrogen. In the breast tissue, it acts as an estrogen blocker. It comes in a pill, and is prescribed for women who want to use an estrogen-like medication for vaginal dryness or painful sex associated with vaginal dryness, but prefer not to use a vaginal medication. The medication may cause hot flashes as a side effect. This type of medication may increase the risk of blood clots or uterine cancer. Further study of ospemifene is needed to evaluate the risk of these complications. This medication has not been tested in women who have had breast cancer or are at a high risk of developing   breast cancer.    Sexual activity - Vaginal estrogen improves vaginal dryness quickly, usually within a few weeks. You may continue to have sex as you treat vaginal dryness because sex itself  can help to keep the vaginal tissues healthy. Vaginal intercourse may help the vaginal tissues by keeping them soft and stretchable and preventing the tissues from shrinking.  If sex continues to be painful despite treatment for vaginal dryness, talk to your healthcare provider.    

## 2019-07-21 ENCOUNTER — Encounter: Payer: Self-pay | Admitting: Physician Assistant

## 2019-07-21 LAB — CBC WITH DIFFERENTIAL/PLATELET
Absolute Monocytes: 647 cells/uL (ref 200–950)
Basophils Absolute: 84 cells/uL (ref 0–200)
Basophils Relative: 1 %
Eosinophils Absolute: 277 cells/uL (ref 15–500)
Eosinophils Relative: 3.3 %
HCT: 41.5 % (ref 35.0–45.0)
Hemoglobin: 14.2 g/dL (ref 11.7–15.5)
Lymphs Abs: 2806 cells/uL (ref 850–3900)
MCH: 30.5 pg (ref 27.0–33.0)
MCHC: 34.2 g/dL (ref 32.0–36.0)
MCV: 89.2 fL (ref 80.0–100.0)
MPV: 11.2 fL (ref 7.5–12.5)
Monocytes Relative: 7.7 %
Neutro Abs: 4586 cells/uL (ref 1500–7800)
Neutrophils Relative %: 54.6 %
Platelets: 256 10*3/uL (ref 140–400)
RBC: 4.65 10*6/uL (ref 3.80–5.10)
RDW: 12.8 % (ref 11.0–15.0)
Total Lymphocyte: 33.4 %
WBC: 8.4 10*3/uL (ref 3.8–10.8)

## 2019-07-21 LAB — LIPID PANEL
Cholesterol: 182 mg/dL (ref <200)
HDL: 38 mg/dL — ABNORMAL LOW (ref >=50)
LDL Cholesterol (Calc): 117 mg/dL (calc) — ABNORMAL HIGH
Non-HDL Cholesterol (Calc): 144 mg/dL (calc) — ABNORMAL HIGH (ref <130)
Total CHOL/HDL Ratio: 4.8 (calc) (ref <5.0)
Triglycerides: 157 mg/dL — ABNORMAL HIGH (ref <150)

## 2019-07-21 LAB — COMPLETE METABOLIC PANEL WITH GFR
AG Ratio: 1.3 (calc) (ref 1.0–2.5)
ALT: 34 U/L — ABNORMAL HIGH (ref 6–29)
AST: 47 U/L — ABNORMAL HIGH (ref 10–35)
Albumin: 4.3 g/dL (ref 3.6–5.1)
Alkaline phosphatase (APISO): 139 U/L (ref 37–153)
BUN: 8 mg/dL (ref 7–25)
CO2: 27 mmol/L (ref 20–32)
Calcium: 9.6 mg/dL (ref 8.6–10.4)
Chloride: 100 mmol/L (ref 98–110)
Creat: 0.74 mg/dL (ref 0.50–1.05)
GFR, Est African American: 103 mL/min/{1.73_m2} (ref >=60)
GFR, Est Non African American: 89 mL/min/{1.73_m2} (ref >=60)
Globulin: 3.4 g/dL (calc) (ref 1.9–3.7)
Glucose, Bld: 100 mg/dL — ABNORMAL HIGH (ref 65–99)
Potassium: 4.1 mmol/L (ref 3.5–5.3)
Sodium: 137 mmol/L (ref 135–146)
Total Bilirubin: 0.6 mg/dL (ref 0.2–1.2)
Total Protein: 7.7 g/dL (ref 6.1–8.1)

## 2019-07-21 LAB — URINALYSIS, ROUTINE W REFLEX MICROSCOPIC
Bilirubin Urine: NEGATIVE
Glucose, UA: NEGATIVE
Hgb urine dipstick: NEGATIVE
Ketones, ur: NEGATIVE
Leukocytes,Ua: NEGATIVE
Nitrite: NEGATIVE
Protein, ur: NEGATIVE
Specific Gravity, Urine: 1.004 (ref 1.001–1.03)
pH: 6 (ref 5.0–8.0)

## 2019-07-21 LAB — PAP, TP IMAGING W/ HPV RNA, RFLX HPV TYPE 16,18/45: HPV DNA High Risk: NOT DETECTED

## 2019-07-21 LAB — IRON, TOTAL/TOTAL IRON BINDING CAP
%SAT: 20 % (calc) (ref 16–45)
Iron: 64 ug/dL (ref 45–160)
TIBC: 327 mcg/dL (calc) (ref 250–450)

## 2019-07-21 LAB — TSH: TSH: 0.29 mIU/L — ABNORMAL LOW (ref 0.40–4.50)

## 2019-07-21 LAB — HEMOGLOBIN A1C
Hgb A1c MFr Bld: 6.9 % of total Hgb — ABNORMAL HIGH (ref <5.7)
Mean Plasma Glucose: 151 (calc)
eAG (mmol/L): 8.4 (calc)

## 2019-07-21 LAB — MAGNESIUM: Magnesium: 2 mg/dL (ref 1.5–2.5)

## 2019-07-21 LAB — MICROALBUMIN / CREATININE URINE RATIO
Creatinine, Urine: 19 mg/dL — ABNORMAL LOW (ref 20–275)
Microalb, Ur: <0.2 mg/dL

## 2019-07-21 LAB — VITAMIN B12: Vitamin B-12: 399 pg/mL (ref 200–1100)

## 2019-07-21 LAB — VITAMIN D 25 HYDROXY (VIT D DEFICIENCY, FRACTURES): Vit D, 25-Hydroxy: 42 ng/mL (ref 30–100)

## 2019-07-21 LAB — PAP, TP IMAGING, WNL RFLX HPV

## 2019-07-28 ENCOUNTER — Other Ambulatory Visit: Payer: Self-pay | Admitting: *Deleted

## 2019-07-28 DIAGNOSIS — Z20822 Contact with and (suspected) exposure to covid-19: Secondary | ICD-10-CM

## 2019-07-30 LAB — NOVEL CORONAVIRUS, NAA: SARS-CoV-2, NAA: NOT DETECTED

## 2019-08-06 NOTE — Progress Notes (Signed)
Assessment and Plan:  Thyroid disease -     TSH - recheck with lower medication  Abnormal EKG -     EKG 12-Lead Still with ST depression V2-V5, no symptoms however with risk factors will refer to cardio Er precautions discussed with patient Strong family history of MI DM, obese, not willing to be on statin with Chol 130's Has lost 8 lbs due to motivation since last visit.   Medication management -     CBC with Differential/Platelet -     COMPLETE METABOLIC PANEL WITH GFR -     Magnesium     Future Appointments  Date Time Provider [place]  08/21/2019  3:30 PM [facility] MM 2 [facility]  11/04/2019  3:45 PM Vicie Mutters, PA-C [facility] None  07/19/2020  3:00 PM Vicie Mutters, PA-C [facility] None    HPI 58 y.o.female presents for follow up for abnormal TSH and EKG.   Last visit was CPE, she had abnormal EKG showing mild new ST depression in concordant leads V3-V5/6.   She was also found to have abnormally low TSH, here for recheck, thyroid decreased 1 pill daily but 1/2 on Sunday.   Strong family history of MI DM, obese, not willing to be on statin with Chol 130's  States she is feeling better since being on the B12 and changing her thyroid medications.  She denies cardiac symptoms such as SOB, CP, diaphoresis, nausea, dizziness.   Lab Results  Component Value Date   TSH 0.29 (L) 07/20/2019   Lab Results  Component Value Date   [id number] 07/20/2019   BMI is Body mass index is 35.36 kg/m., she is working on diet and exercise. She is drinking a gallon of water a day, no fried food.  Wt Readings from Last 3 Encounters:  08/10/19 206 lb (93.4 kg)  07/20/19 214 lb (97.1 kg)  07/23/18 219 lb (99.3 kg)    Past Medical History:  Diagnosis Date  . Allergy   . Anxiety   . Arthritis    hip  . Fatty liver disease, nonalcoholic 99991111   Via U/S  . GERD (gastroesophageal reflux disease)   . Hyperlipidemia   . Hypertension   . Insomnia    . Nephrolithiasis   . Prediabetes    no meds - diet controlled  . Thyroid disease   . Vitamin D deficiency      Allergies  Allergen Reactions  . Atenolol     Beta blockers. Pt doesn't remember there being a problem  . Codeine Nausea Only  . Klonopin [Clonazepam] Rash  . Penicillins Rash    Current Outpatient Medications on File Prior to Visit  Medication Sig  . ALPRAZolam (XANAX) 0.5 MG tablet TAKE 1/2 TO 1 TABLET BY  MOUTH ONCE TO TWICE DAILY  AS NEEDED FOR PANIC OR  ANXIETY ATTACK, LIMIT USE  TO 5 DAYS A WEEK TO AVOID  ADDICTION.  Marland Kitchen aspirin 81 MG tablet Take 81 mg by mouth daily.  . cholecalciferol (VITAMIN D) 1000 UNITS tablet Take 10,000 Units by mouth daily.   . clotrimazole-betamethasone (LOTRISONE) cream Apply 1 application topically 2 (two) times daily.  Marland Kitchen diltiazem (CARDIZEM CD) 240 MG 24 hr capsule Take 1 capsule Daily for BP  . hydrochlorothiazide (HYDRODIURIL) 25 MG tablet Take 1 tablet Daily for BP & Fluid  . hydrOXYzine (ATARAX/VISTARIL) 25 MG tablet Take 1 tablet (25 mg total) by mouth every 8 (eight) hours as needed for itching.  . Multiple Vitamin (MULTIVITAMIN  WITH MINERALS) TABS tablet Take 1 tablet by mouth daily.  . Omega-3 Fatty Acids (FISH OIL PO) Take 1 capsule by mouth daily.  . quinapril (ACCUPRIL) 40 MG tablet Take 1 tablet (40 mg total) by mouth daily.  Marland Kitchen SYNTHROID 200 MCG tablet Take 1 tablet daily on an empty stomach with only water for 30 minutes & no Antacid meds/Calcium/Magnesium for 4 hrs & avoid Biotin   No current facility-administered medications on file prior to visit.     ROS: all negative except above.   Physical Exam: Filed Weights   08/10/19 1517  Weight: 206 lb (93.4 kg)   BP 130/80   Pulse 88   Temp 97.9 F (36.6 C)   Ht 5\' 4"  (1.626 m)   Wt 206 lb (93.4 kg)   LMP 09/01/2013   SpO2 98%   BMI 35.36 kg/m  General Appearance: Well nourished, in no apparent distress. Eyes: PERRLA, EOMs, conjunctiva no swelling or  erythema Sinuses: No Frontal/maxillary tenderness ENT/Mouth: Ext aud canals clear, TMs without erythema, bulging. No erythema, swelling, or exudate on post pharynx.  Tonsils not swollen or erythematous. Hearing normal.  Neck: Supple, thyroid normal.  Respiratory: Respiratory effort normal, BS equal bilaterally without rales, rhonchi, wheezing or stridor.  Cardio: RRR with no MRGs. Brisk peripheral pulses without edema.  Abdomen: Soft, + BS.  Non tender, no guarding, rebound, hernias, masses. Lymphatics: Non tender without lymphadenopathy.  Musculoskeletal: Full ROM, 5/5 strength, normal gait.  Skin: Warm, dry without rashes, lesions, ecchymosis.  Neuro: Cranial nerves intact. Normal muscle tone, no cerebellar symptoms. Sensation intact.  Psych: Awake and oriented X 3, normal affect, Insight and Judgment appropriate.     Vicie Mutters, PA-C 3:43 PM [facility]

## 2019-08-10 ENCOUNTER — Other Ambulatory Visit: Payer: Self-pay

## 2019-08-10 ENCOUNTER — Ambulatory Visit: Payer: 59 | Admitting: Physician Assistant

## 2019-08-10 ENCOUNTER — Encounter: Payer: Self-pay | Admitting: Physician Assistant

## 2019-08-10 VITALS — BP 130/80 | HR 88 | Temp 97.9°F | Ht 64.0 in | Wt 206.0 lb

## 2019-08-10 DIAGNOSIS — E079 Disorder of thyroid, unspecified: Secondary | ICD-10-CM | POA: Diagnosis not present

## 2019-08-10 DIAGNOSIS — R9431 Abnormal electrocardiogram [ECG] [EKG]: Secondary | ICD-10-CM

## 2019-08-10 DIAGNOSIS — Z79899 Other long term (current) drug therapy: Secondary | ICD-10-CM | POA: Diagnosis not present

## 2019-08-10 NOTE — Patient Instructions (Addendum)
EXERCISE IS MEDICINE!  I want to challenge you to lose 10 lbs before the next appointment.  That would be about 3 lbs a month!  This is very achievable and I know you can do it.  10 lbs is actually 40 lbs of pressure of your joints, back, hips, knees and ankles.  10lbs may be enough to help your blood pressure and cholesterol for next visit.    Benefits of Exercise  Reduces breast cancer onset and recurrence by 50% Lowers risk of colon cancer by 66% Reduces the risk of Alzheimer's by almost 50% Reduces heart disease and high blood pressure by almost 50% Lowers risk of stroke by 33% Lowers risk of Type II Diabetes Mellitus by over 60% Treats depression as well as medication or cognitive behavioral therapy

## 2019-08-11 LAB — COMPLETE METABOLIC PANEL WITH GFR
AG Ratio: 1.3 (calc) (ref 1.0–2.5)
ALT: 43 U/L — ABNORMAL HIGH (ref 6–29)
AST: 53 U/L — ABNORMAL HIGH (ref 10–35)
Albumin: 4.4 g/dL (ref 3.6–5.1)
Alkaline phosphatase (APISO): 129 U/L (ref 37–153)
BUN: 8 mg/dL (ref 7–25)
CO2: 26 mmol/L (ref 20–32)
Calcium: 10 mg/dL (ref 8.6–10.4)
Chloride: 100 mmol/L (ref 98–110)
Creat: 0.64 mg/dL (ref 0.50–1.05)
GFR, Est African American: 113 mL/min/{1.73_m2} (ref >=60)
GFR, Est Non African American: 98 mL/min/{1.73_m2} (ref >=60)
Globulin: 3.5 g/dL (calc) (ref 1.9–3.7)
Glucose, Bld: 91 mg/dL (ref 65–99)
Potassium: 3.8 mmol/L (ref 3.5–5.3)
Sodium: 138 mmol/L (ref 135–146)
Total Bilirubin: 0.6 mg/dL (ref 0.2–1.2)
Total Protein: 7.9 g/dL (ref 6.1–8.1)

## 2019-08-11 LAB — CBC WITH DIFFERENTIAL/PLATELET
Absolute Monocytes: 646 cells/uL (ref 200–950)
Basophils Absolute: 61 cells/uL (ref 0–200)
Basophils Relative: 0.8 %
Eosinophils Absolute: 190 cells/uL (ref 15–500)
Eosinophils Relative: 2.5 %
HCT: 44 % (ref 35.0–45.0)
Hemoglobin: 14.7 g/dL (ref 11.7–15.5)
Lymphs Abs: 2630 cells/uL (ref 850–3900)
MCH: 30 pg (ref 27.0–33.0)
MCHC: 33.4 g/dL (ref 32.0–36.0)
MCV: 89.8 fL (ref 80.0–100.0)
MPV: 12.1 fL (ref 7.5–12.5)
Monocytes Relative: 8.5 %
Neutro Abs: 4074 cells/uL (ref 1500–7800)
Neutrophils Relative %: 53.6 %
Platelets: 245 10*3/uL (ref 140–400)
RBC: 4.9 10*6/uL (ref 3.80–5.10)
RDW: 12.8 % (ref 11.0–15.0)
Total Lymphocyte: 34.6 %
WBC: 7.6 10*3/uL (ref 3.8–10.8)

## 2019-08-11 LAB — TSH: TSH: 0.07 mIU/L — ABNORMAL LOW (ref 0.40–4.50)

## 2019-08-11 LAB — MAGNESIUM: Magnesium: 2 mg/dL (ref 1.5–2.5)

## 2019-08-12 ENCOUNTER — Encounter: Payer: 59 | Admitting: Gastroenterology

## 2019-08-21 ENCOUNTER — Ambulatory Visit
Admission: RE | Admit: 2019-08-21 | Discharge: 2019-08-21 | Disposition: A | Discharge status: Home / Self Care | Payer: 59 | Source: Ambulatory Visit | Attending: Internal Medicine | Admitting: Internal Medicine

## 2019-08-21 ENCOUNTER — Other Ambulatory Visit: Payer: Self-pay

## 2019-08-21 DIAGNOSIS — Z1231 Encounter for screening mammogram for malignant neoplasm of breast: Secondary | ICD-10-CM

## 2019-08-22 ENCOUNTER — Other Ambulatory Visit: Payer: Self-pay | Admitting: Internal Medicine

## 2019-08-22 DIAGNOSIS — I10 Essential (primary) hypertension: Secondary | ICD-10-CM

## 2019-09-12 ENCOUNTER — Other Ambulatory Visit: Payer: Self-pay | Admitting: Internal Medicine

## 2019-10-04 NOTE — Progress Notes (Signed)
Cardiology Office Note   Date:  10/05/2019   ID:  Shannon Pope, DOB July 16, 1961, MRN [id number]  PCP:  Shannon Pinto, MD  Cardiologist:   No primary care provider on file. Referring:  Shannon Pinto, MD  Chief Complaint  Patient presents with  . Abnormal ECG      History of Present Illness: Shannon Pope is a 58 y.o. female who was referred by Shannon Pinto, MD for evaluation of an abnormal EKG. the patient has no past cardiac history.  He was recently noted on EKG to have some mild ST depression inferolaterally that was not apparently present previously.  He also appears to have mildly increased QT interval.  She denies any cardiovascular symptoms.  She was feeling weak without as much energy but was noted recently to have a reduced TSH and her Synthroid was adjusted downward accordingly.  She says she feels better since this.  She has had no presyncope or syncope.  She has never felt any palpitations.  She denies any chest pressure, neck or arm discomfort.  She had some intentional weight loss and has been walking for exercise and feels better since she did all of this.  She does get some mild dyspnea with exercise.  She is changed with diet a little bit.  Past Medical History:  Diagnosis Date  . Allergy   . Anxiety   . Arthritis    hip  . Fatty liver disease, nonalcoholic 99991111   Via U/S  . GERD (gastroesophageal reflux disease)   . Hyperlipidemia   . Hypertension   . Insomnia   . Nephrolithiasis   . Prediabetes    no meds - diet controlled  . Thyroid disease   . Vitamin D deficiency     Past Surgical History:  Procedure Laterality Date  . BREAST BIOPSY Left   . CHOLECYSTECTOMY    . LITHOTRIPSY  2016  . TUBAL LIGATION       Current Outpatient Medications  Medication Sig Dispense Refill  . ALPRAZolam (XANAX) 0.5 MG tablet TAKE 1/2 TO 1 TABLET BY  MOUTH ONCE TO TWICE DAILY  AS NEEDED FOR PANIC OR  ANXIETY ATTACK, LIMIT USE  TO 5 DAYS A WEEK TO  AVOID  ADDICTION. 90 tablet 0  . aspirin 81 MG tablet Take 81 mg by mouth daily.    . cholecalciferol (VITAMIN D) 1000 UNITS tablet Take 10,000 Units by mouth daily.     . clotrimazole-betamethasone (LOTRISONE) cream Apply 1 application topically 2 (two) times daily. 45 g 2  . diltiazem (CARDIZEM CD) 240 MG 24 hr capsule Take 1 capsule Daily for BP 90 capsule 0  . hydrochlorothiazide (HYDRODIURIL) 25 MG tablet Take 1 tablet Daily for BP, Fluid Retention & Ankle Swelling 90 tablet 0  . hydrOXYzine (ATARAX/VISTARIL) 25 MG tablet Take 1 tablet (25 mg total) by mouth every 8 (eight) hours as needed for itching. 90 tablet 0  . Multiple Vitamin (MULTIVITAMIN WITH MINERALS) TABS tablet Take 1 tablet by mouth daily.    . Omega-3 Fatty Acids (FISH OIL PO) Take 1 capsule by mouth daily.    . quinapril (ACCUPRIL) 40 MG tablet Take 1 tablet Daily for BP 90 tablet 3  . SYNTHROID 200 MCG tablet Take 1 tablet daily on an empty stomach with only water for 30 minutes & no Antacid meds/Calcium/Magnesium for 4 hrs & avoid Biotin 90 tablet 1   No current facility-administered medications for this visit.     Allergies:  Atenolol, Codeine, Klonopin [clonazepam], and Penicillins    Social History:  The patient  reports that she quit smoking about 25 years ago. She has a 10.00 pack-year smoking history. She has never used smokeless tobacco. She reports current alcohol use of about 2.0 standard drinks of alcohol per week. She reports that she does not use drugs.   Family History:  The patient's family history includes Heart Problems (age of onset: 101) in her mother; Hypertension in her mother; Peripheral Artery Disease in her sister.    ROS:  Please see the history of present illness.   Otherwise, review of systems are positive for none.   All other systems are reviewed and negative.    PHYSICAL EXAM: VS:  BP 124/78   Pulse 86   Ht 5\' 4"  (1.626 m)   Wt 196 lb (88.9 kg)   LMP 09/01/2013   SpO2 97%   BMI  33.64 kg/m  , BMI Body mass index is 33.64 kg/m. GENERAL:  Well appearing HEENT:  Pupils equal round and reactive, fundi not visualized, oral mucosa unremarkable NECK:  No jugular venous distention, waveform within normal limits, carotid upstroke brisk and symmetric, no bruits, no thyromegaly LYMPHATICS:  No cervical, inguinal adenopathy LUNGS:  Clear to auscultation bilaterally BACK:  No CVA tenderness CHEST:  Unremarkable HEART:  PMI not displaced or sustained,S1 and S2 within normal limits, no S3, no S4, no clicks, no rubs, no murmurs ABD:  Flat, positive bowel sounds normal in frequency in pitch, no bruits, no rebound, no guarding, no midline pulsatile mass, no hepatomegaly, no splenomegaly EXT:  2 plus pulses throughout, no edema, no cyanosis no clubbing SKIN:  No rashes no nodules NEURO:  Cranial nerves II through XII grossly intact, motor grossly intact throughout PSYCH:  Cognitively intact, oriented to person place and time    EKG:  EKG is ordered today. The ekg ordered today demonstrates sinus rhythm, rate 86, axis within normal limits, QTC prolonged, nonspecific inferolateral ST depression.   Recent Labs: 08/10/2019: ALT 43; BUN 8; Creat 0.64; Hemoglobin 14.7; Magnesium 2.0; Platelets 245; Potassium 3.8; Sodium 138; TSH 0.07    Lipid Panel    Component Value Date/Time   CHOL 182 07/20/2019 0000   TRIG 157 (H) 07/20/2019 0000   HDL 38 (L) 07/20/2019 0000   CHOLHDL 4.8 07/20/2019 0000   VLDL 32 (H) 06/03/2017 1508   LDLCALC 117 (H) 07/20/2019 0000      Wt Readings from Last 3 Encounters:  10/05/19 196 lb (88.9 kg)  08/10/19 206 lb (93.4 kg)  07/20/19 214 lb (97.1 kg)      Other studies Reviewed: Additional studies/ records that were reviewed today include: Labs. Review of the above records demonstrates:  Please see elsewhere in the note.     ASSESSMENT AND PLAN:  ABNORMAL EKG: Given the mild dyspnea with exercise and a strong family history we will screen  her for obstructive coronary disease by starting with a coronary calcium score.  I will have a low threshold for either CTA or POET (Plain Old Exercise Treadmill) pending these results.  QT: Her QT is prolonged.  However, she does not have any symptoms consistent with prolonged QT syndrome.  We talked about avoiding QT prolonging drugs.  Her potassium was normal recently.  Her magnesium was normal earlier this year.  No other change in therapy.  DYSLIPIDEMIA: Goals of therapy will be based on the results of calcium or evidence of vascular disease.   Current medicines are  reviewed at length with the patient today.  The patient does not have concerns regarding medicines.  The following changes have been made:  no change  Labs/ tests ordered today include:   Orders Placed This Encounter  Procedures  . CT CARDIAC SCORING  . EKG 12-Lead     Disposition:   FU with me as needed.     Signed, Minus Breeding, MD  10/05/2019 2:09 PM    [place]

## 2019-10-05 ENCOUNTER — Encounter: Payer: Self-pay | Admitting: Cardiology

## 2019-10-05 ENCOUNTER — Other Ambulatory Visit: Payer: Self-pay

## 2019-10-05 ENCOUNTER — Ambulatory Visit: Payer: 59 | Admitting: Cardiology

## 2019-10-05 ENCOUNTER — Encounter: Payer: Self-pay | Admitting: Physician Assistant

## 2019-10-05 VITALS — BP 124/78 | HR 86 | Ht 64.0 in | Wt 196.0 lb

## 2019-10-05 DIAGNOSIS — R9431 Abnormal electrocardiogram [ECG] [EKG]: Secondary | ICD-10-CM | POA: Insufficient documentation

## 2019-10-05 DIAGNOSIS — I10 Essential (primary) hypertension: Secondary | ICD-10-CM | POA: Diagnosis not present

## 2019-10-05 DIAGNOSIS — E785 Hyperlipidemia, unspecified: Secondary | ICD-10-CM | POA: Diagnosis not present

## 2019-10-05 NOTE — Patient Instructions (Signed)
Medication Instructions:  Your physician recommends that you continue on your current medications as directed. Please refer to the Current Medication list given to you today.  If you need a refill on your cardiac medications before your next appointment, please call your pharmacy.   Lab work: NONE  Testing/Procedures: Coronary Calcium Score  Follow-Up: At [facility], you and your health needs are our priority.  As part of our continuing mission to provide you with exceptional heart care, we have created designated Provider Care Teams.  These Care Teams include your primary Cardiologist (physician) and Advanced Practice Providers (APPs -  Physician Assistants and Nurse Practitioners) who all work together to provide you with the care you need, when you need it. You may see Dr Percival Spanish or one of the following Advanced Practice Providers on your designated Care Team:    Rosaria Ferries, PA-C  Jory Sims, DNP, ANP  Cadence Kathlen Mody, NP   Your physician wants you to follow-up as needed.   Any Other Special Instructions Will Be Listed Below (If Applicable).  Coronary Calcium Scan A coronary calcium scan is an imaging test used to look for deposits of calcium and other fatty materials (plaques) in the inner lining of the blood vessels of the heart (coronary arteries). These deposits of calcium and plaques can partly clog and narrow the coronary arteries without producing any symptoms or warning signs. This puts a person at risk for a heart attack. This test can detect these deposits before symptoms develop. Tell a health care provider about:  Any allergies you have.  All medicines you are taking, including vitamins, herbs, eye drops, creams, and over-the-counter medicines.  Any problems you or family members have had with anesthetic medicines.  Any blood disorders you have.  Any surgeries you have had.  Any medical conditions you have.  Whether you are pregnant or may be  pregnant. What are the risks? Generally, this is a safe procedure. However, problems may occur, including:  Harm to a pregnant woman and her unborn baby. This test involves the use of radiation. Radiation exposure can be dangerous to a pregnant woman and her unborn baby. If you are pregnant, you generally should not have this procedure done.  Slight increase in the risk of cancer. This is because of the radiation involved in the test. What happens before the procedure? No preparation is needed for this procedure. What happens during the procedure?   You will undress and remove any jewelry around your neck or chest.  You will put on a hospital gown.  Sticky electrodes will be placed on your chest. The electrodes will be connected to an electrocardiogram (ECG) machine to record a tracing of the electrical activity of your heart.  A CT scanner will take pictures of your heart. During this time, you will be asked to lie still and hold your breath for 2-3 seconds while a picture of your heart is being taken. The procedure may vary among health care providers and hospitals. What happens after the procedure?  You can get dressed.  You can return to your normal activities.  It is up to you to get the results of your test. Ask your health care provider, or the department that is doing the test, when your results will be ready. Summary  A coronary calcium scan is an imaging test used to look for deposits of calcium and other fatty materials (plaques) in the inner lining of the blood vessels of the heart (coronary arteries).  Generally,  this is a safe procedure. Tell your health care provider if you are pregnant or may be pregnant.  No preparation is needed for this procedure.  A CT scanner will take pictures of your heart.  You can return to your normal activities after the scan is done. This information is not intended to replace advice given to you by your health care provider. Make  sure you discuss any questions you have with your health care provider. Document Released: 05/03/2008 Document Revised: 10/18/2017 Document Reviewed: 09/24/2016 Elsevier Patient Education  2020 [employer].

## 2019-10-07 ENCOUNTER — Encounter: Payer: 59 | Admitting: Physician Assistant

## 2019-10-27 ENCOUNTER — Other Ambulatory Visit: Payer: Self-pay

## 2019-10-27 ENCOUNTER — Ambulatory Visit (INDEPENDENT_AMBULATORY_CARE_PROVIDER_SITE_OTHER)
Admission: RE | Admit: 2019-10-27 | Discharge: 2019-10-27 | Disposition: A | Discharge status: Home / Self Care | Payer: Self-pay | Source: Ambulatory Visit | Attending: Cardiology | Admitting: Cardiology

## 2019-10-27 DIAGNOSIS — I10 Essential (primary) hypertension: Secondary | ICD-10-CM

## 2019-10-27 DIAGNOSIS — E785 Hyperlipidemia, unspecified: Secondary | ICD-10-CM

## 2019-10-28 ENCOUNTER — Other Ambulatory Visit: Payer: Self-pay

## 2019-10-28 DIAGNOSIS — Z8249 Family history of ischemic heart disease and other diseases of the circulatory system: Secondary | ICD-10-CM

## 2019-11-04 ENCOUNTER — Ambulatory Visit: Payer: 59 | Admitting: Physician Assistant

## 2019-11-26 ENCOUNTER — Telehealth (HOSPITAL_COMMUNITY): Payer: Self-pay

## 2019-11-26 NOTE — Telephone Encounter (Signed)
Encounter complete. 

## 2019-11-27 ENCOUNTER — Other Ambulatory Visit (HOSPITAL_COMMUNITY)
Admission: RE | Admit: 2019-11-27 | Discharge: 2019-11-27 | Disposition: A | Discharge status: Home / Self Care | Payer: 59 | Source: Ambulatory Visit | Attending: Cardiology | Admitting: Cardiology

## 2019-11-27 DIAGNOSIS — Z01812 Encounter for preprocedural laboratory examination: Secondary | ICD-10-CM | POA: Insufficient documentation

## 2019-11-27 DIAGNOSIS — Z20822 Contact with and (suspected) exposure to covid-19: Secondary | ICD-10-CM | POA: Insufficient documentation

## 2019-11-28 LAB — NOVEL CORONAVIRUS, NAA (HOSP ORDER, SEND-OUT TO REF LAB; TAT 18-24 HRS): SARS-CoV-2, NAA: NOT DETECTED

## 2019-12-01 ENCOUNTER — Ambulatory Visit (HOSPITAL_COMMUNITY)
Admission: RE | Admit: 2019-12-01 | Discharge: 2019-12-01 | Disposition: A | Discharge status: Home / Self Care | Payer: 59 | Source: Ambulatory Visit | Attending: Cardiovascular Disease | Admitting: Cardiovascular Disease

## 2019-12-01 ENCOUNTER — Encounter (HOSPITAL_COMMUNITY): Payer: Self-pay | Admitting: *Deleted

## 2019-12-01 ENCOUNTER — Other Ambulatory Visit: Payer: Self-pay

## 2019-12-01 DIAGNOSIS — R5383 Other fatigue: Secondary | ICD-10-CM | POA: Insufficient documentation

## 2019-12-01 DIAGNOSIS — Z8249 Family history of ischemic heart disease and other diseases of the circulatory system: Secondary | ICD-10-CM | POA: Insufficient documentation

## 2019-12-01 DIAGNOSIS — E785 Hyperlipidemia, unspecified: Secondary | ICD-10-CM | POA: Insufficient documentation

## 2019-12-01 DIAGNOSIS — R0602 Shortness of breath: Secondary | ICD-10-CM | POA: Insufficient documentation

## 2019-12-01 DIAGNOSIS — Z87891 Personal history of nicotine dependence: Secondary | ICD-10-CM | POA: Insufficient documentation

## 2019-12-01 DIAGNOSIS — I10 Essential (primary) hypertension: Secondary | ICD-10-CM | POA: Insufficient documentation

## 2019-12-01 LAB — EXERCISE TOLERANCE TEST
Estimated workload: 8.2 METS
Exercise duration (min): 6 min
Exercise duration (sec): 47 s
MPHR: 160 {beats}/min
Peak HR: 148 {beats}/min
Percent HR: 92 %
Rest HR: 91 {beats}/min

## 2019-12-01 NOTE — Progress Notes (Unsigned)
Abnormal ETT was reviewed by Dr. Oval Linsey. Patient was given the ok to be discharged to go home.

## 2019-12-02 ENCOUNTER — Other Ambulatory Visit: Payer: Self-pay | Admitting: Internal Medicine

## 2019-12-02 ENCOUNTER — Telehealth: Payer: Self-pay | Admitting: *Deleted

## 2019-12-02 DIAGNOSIS — E785 Hyperlipidemia, unspecified: Secondary | ICD-10-CM

## 2019-12-02 DIAGNOSIS — I10 Essential (primary) hypertension: Secondary | ICD-10-CM

## 2019-12-02 DIAGNOSIS — Z01812 Encounter for preprocedural laboratory examination: Secondary | ICD-10-CM

## 2019-12-02 DIAGNOSIS — R9439 Abnormal result of other cardiovascular function study: Secondary | ICD-10-CM

## 2019-12-02 DIAGNOSIS — Z8249 Family history of ischemic heart disease and other diseases of the circulatory system: Secondary | ICD-10-CM

## 2019-12-02 NOTE — Telephone Encounter (Signed)
-----   Message from Minus Breeding, MD sent at 12/02/2019  2:26 PM EST ----- This test is mildly abnormal and I suspect, given zero calcium, does not indicate obstructive CAD.  However, given the dyspnea and risk factors we should get a coronary CTA.  Call Ms. Vanantwerp with the results and send results to Unk Pinto, MD

## 2019-12-02 NOTE — Telephone Encounter (Signed)
Advised patient. Message sent to precert and scheduled. Did reach out to Dr Percival Spanish regarding the allergy to Atenolol (beta blocker). Patient unsure of reaction.

## 2019-12-03 ENCOUNTER — Encounter: Payer: Self-pay | Admitting: *Deleted

## 2019-12-03 MED ORDER — METOPROLOL TARTRATE 50 MG PO TABS: ORAL_TABLET | ORAL | 0 refills | Status: DC

## 2019-12-03 NOTE — Telephone Encounter (Signed)
-----   Message from Minus Breeding, MD sent at 12/02/2019  7:35 PM EST ----- If she does not remember hives and difficult breathing I would give beta blocker.  ----- Message ----- From: Earvin Hansen, LPN Sent: [id number]   6:05 PM EST To: Minus Breeding, MD, Earvin Hansen, LPN  Shannon Pope has allergy to Atenolol and under says Beta Blockers. She does not remember reaction. Please advise on what to do regarding Metoprolol Thanks Rip Harbour

## 2019-12-03 NOTE — Telephone Encounter (Signed)
Spoke with patient and she thinks Atenolol was stopped because HR too low. Discussed Metoprolol dose with Dr Percival Spanish and will have patient take 50 mg instead of 100 mg.  Advised to take Metoprolol 50 mg 2 hours prior

## 2019-12-28 ENCOUNTER — Other Ambulatory Visit: Payer: Self-pay

## 2019-12-28 ENCOUNTER — Telehealth: Payer: Self-pay

## 2019-12-28 MED ORDER — METOPROLOL TARTRATE 50 MG PO TABS: ORAL_TABLET | ORAL | 0 refills | Status: DC

## 2019-12-28 NOTE — Telephone Encounter (Signed)
New message    Please call in the medication for the cardiac ct to the [facility], thank you

## 2019-12-28 NOTE — Telephone Encounter (Signed)
RX for metoprolol prior to CT sent to the pharmacy per pt request.

## 2019-12-31 LAB — BASIC METABOLIC PANEL
BUN/Creatinine Ratio: 17 (ref 12–28)
BUN: 12 mg/dL (ref 8–27)
CO2: 24 mmol/L (ref 20–29)
Calcium: 9.4 mg/dL (ref 8.7–10.3)
Chloride: 99 mmol/L (ref 96–106)
Creatinine, Ser: 0.69 mg/dL (ref 0.57–1.00)
GFR calc Af Amer: 109 mL/min/{1.73_m2} (ref >59)
GFR calc non Af Amer: 95 mL/min/{1.73_m2} (ref >59)
Glucose: 108 mg/dL — ABNORMAL HIGH (ref 65–99)
Potassium: 3.9 mmol/L (ref 3.5–5.2)
Sodium: 139 mmol/L (ref 134–144)

## 2020-01-06 ENCOUNTER — Telehealth (HOSPITAL_COMMUNITY): Payer: Self-pay | Admitting: Emergency Medicine

## 2020-01-06 ENCOUNTER — Encounter (HOSPITAL_COMMUNITY): Payer: Self-pay

## 2020-01-06 NOTE — Telephone Encounter (Signed)
Reaching out to patient to offer assistance regarding upcoming cardiac imaging study; pt verbalizes understanding of appt date/time, parking situation and where to check in, pre-test NPO status and medications ordered, and verified current allergies; name and call back number provided for further questions should they arise Jenina Moening RN Navigator Cardiac Imaging [facility] [phone] office [phone] cell 

## 2020-01-07 ENCOUNTER — Other Ambulatory Visit: Payer: Self-pay

## 2020-01-07 ENCOUNTER — Ambulatory Visit (HOSPITAL_COMMUNITY)
Admission: RE | Admit: 2020-01-07 | Discharge: 2020-01-07 | Disposition: A | Discharge status: Home / Self Care | Payer: 59 | Source: Ambulatory Visit | Attending: Cardiology | Admitting: Cardiology

## 2020-01-07 DIAGNOSIS — Z8249 Family history of ischemic heart disease and other diseases of the circulatory system: Secondary | ICD-10-CM | POA: Insufficient documentation

## 2020-01-07 DIAGNOSIS — R9439 Abnormal result of other cardiovascular function study: Secondary | ICD-10-CM | POA: Diagnosis present

## 2020-01-07 DIAGNOSIS — I10 Essential (primary) hypertension: Secondary | ICD-10-CM

## 2020-01-07 DIAGNOSIS — R9430 Abnormal result of cardiovascular function study, unspecified: Secondary | ICD-10-CM

## 2020-01-07 DIAGNOSIS — E785 Hyperlipidemia, unspecified: Secondary | ICD-10-CM | POA: Diagnosis not present

## 2020-01-07 MED ORDER — NITROGLYCERIN 0.4 MG SL SUBL
0.8000 mg | SUBLINGUAL_TABLET | Freq: Once | SUBLINGUAL | Status: AC
  Administered 2020-01-07: 0.8 mg via SUBLINGUAL

## 2020-01-07 MED ORDER — IOHEXOL 350 MG/ML SOLN
100.0000 mL | Freq: Once | INTRAVENOUS | Status: AC | PRN
  Administered 2020-01-07: 100 mL via INTRAVENOUS

## 2020-01-07 MED ORDER — NITROGLYCERIN 0.4 MG SL SUBL
SUBLINGUAL_TABLET | SUBLINGUAL | Status: AC
  Filled 2020-01-07: qty 2

## 2020-01-07 NOTE — Progress Notes (Signed)
CT scan completed. Tolerated well. D/C home ambulatory in no distress. 

## 2020-02-01 ENCOUNTER — Other Ambulatory Visit: Payer: Self-pay | Admitting: Physician Assistant

## 2020-02-01 DIAGNOSIS — E079 Disorder of thyroid, unspecified: Secondary | ICD-10-CM

## 2020-02-29 ENCOUNTER — Encounter: Payer: 59 | Admitting: Physician Assistant

## 2020-03-07 ENCOUNTER — Other Ambulatory Visit: Payer: Self-pay | Admitting: Internal Medicine

## 2020-03-07 MED ORDER — AZITHROMYCIN 250 MG PO TABS: ORAL_TABLET | ORAL | 0 refills | Status: AC

## 2020-05-02 ENCOUNTER — Other Ambulatory Visit: Payer: Self-pay | Admitting: Physician Assistant

## 2020-05-02 ENCOUNTER — Other Ambulatory Visit: Payer: Self-pay | Admitting: Internal Medicine

## 2020-05-02 DIAGNOSIS — I10 Essential (primary) hypertension: Secondary | ICD-10-CM

## 2020-05-02 DIAGNOSIS — G4700 Insomnia, unspecified: Secondary | ICD-10-CM

## 2020-05-02 MED ORDER — DILTIAZEM HCL ER COATED BEADS 240 MG PO CP24: ORAL_CAPSULE | ORAL | 0 refills | Status: DC

## 2020-07-10 ENCOUNTER — Other Ambulatory Visit: Payer: Self-pay | Admitting: Internal Medicine

## 2020-07-10 DIAGNOSIS — I10 Essential (primary) hypertension: Secondary | ICD-10-CM

## 2020-07-13 ENCOUNTER — Other Ambulatory Visit: Payer: Self-pay | Admitting: Physician Assistant

## 2020-07-13 ENCOUNTER — Other Ambulatory Visit: Payer: Self-pay | Admitting: Internal Medicine

## 2020-07-13 DIAGNOSIS — I10 Essential (primary) hypertension: Secondary | ICD-10-CM

## 2020-07-13 DIAGNOSIS — G4700 Insomnia, unspecified: Secondary | ICD-10-CM

## 2020-07-19 ENCOUNTER — Encounter: Payer: 59 | Admitting: Adult Health Nurse Practitioner

## 2020-08-06 ENCOUNTER — Other Ambulatory Visit: Payer: Self-pay | Admitting: Internal Medicine

## 2020-08-06 DIAGNOSIS — I10 Essential (primary) hypertension: Secondary | ICD-10-CM

## 2020-09-20 ENCOUNTER — Other Ambulatory Visit: Payer: Self-pay | Admitting: Internal Medicine

## 2020-09-20 ENCOUNTER — Other Ambulatory Visit: Payer: Self-pay | Admitting: Adult Health

## 2020-09-20 DIAGNOSIS — I10 Essential (primary) hypertension: Secondary | ICD-10-CM

## 2020-09-20 DIAGNOSIS — G4700 Insomnia, unspecified: Secondary | ICD-10-CM

## 2020-09-20 MED ORDER — ALPRAZOLAM 0.5 MG PO TABS: ORAL_TABLET | ORAL | 0 refills | Status: DC

## 2020-10-28 ENCOUNTER — Other Ambulatory Visit: Payer: Self-pay | Admitting: Internal Medicine

## 2020-10-28 DIAGNOSIS — G4700 Insomnia, unspecified: Secondary | ICD-10-CM

## 2020-10-28 DIAGNOSIS — I10 Essential (primary) hypertension: Secondary | ICD-10-CM

## 2020-11-01 ENCOUNTER — Other Ambulatory Visit: Payer: Self-pay | Admitting: Internal Medicine

## 2020-11-01 DIAGNOSIS — E079 Disorder of thyroid, unspecified: Secondary | ICD-10-CM

## 2020-12-02 ENCOUNTER — Other Ambulatory Visit: Payer: Self-pay | Admitting: Internal Medicine

## 2020-12-02 MED ORDER — BENZONATATE 200 MG PO CAPS: ORAL_CAPSULE | ORAL | 1 refills | Status: DC

## 2020-12-02 MED ORDER — DEXAMETHASONE 4 MG PO TABS: ORAL_TABLET | ORAL | 0 refills | Status: DC

## 2020-12-02 MED ORDER — PROMETHAZINE-DM 6.25-15 MG/5ML PO SYRP: ORAL_SOLUTION | ORAL | 1 refills | Status: DC

## 2021-01-03 ENCOUNTER — Other Ambulatory Visit: Payer: Self-pay | Admitting: Internal Medicine

## 2021-01-03 DIAGNOSIS — I10 Essential (primary) hypertension: Secondary | ICD-10-CM

## 2021-01-26 ENCOUNTER — Other Ambulatory Visit: Payer: Self-pay | Admitting: Internal Medicine

## 2021-01-26 DIAGNOSIS — I10 Essential (primary) hypertension: Secondary | ICD-10-CM

## 2021-01-29 ENCOUNTER — Other Ambulatory Visit: Payer: Self-pay | Admitting: Internal Medicine

## 2021-03-12 ENCOUNTER — Other Ambulatory Visit: Payer: Self-pay | Admitting: Internal Medicine

## 2021-03-12 DIAGNOSIS — I10 Essential (primary) hypertension: Secondary | ICD-10-CM

## 2021-03-31 ENCOUNTER — Other Ambulatory Visit: Payer: Self-pay | Admitting: Internal Medicine

## 2021-03-31 DIAGNOSIS — E079 Disorder of thyroid, unspecified: Secondary | ICD-10-CM

## 2021-04-21 ENCOUNTER — Other Ambulatory Visit: Payer: Self-pay | Admitting: Internal Medicine

## 2021-04-21 DIAGNOSIS — E079 Disorder of thyroid, unspecified: Secondary | ICD-10-CM

## 2021-05-17 DIAGNOSIS — R911 Solitary pulmonary nodule: Secondary | ICD-10-CM | POA: Insufficient documentation

## 2021-05-17 NOTE — Progress Notes (Signed)
Complete Physical  Assessment and Plan:  Abdominal Aortic Atherosclerosis(HCC)  Abnormal EKG Prolonged QT diagnosed by Cardiologist but stressed it was not QT syndrome- ekg today is normal sinus rhythm Strong family history of MI DM, obese, not willing to be on statin with Chol 130's No symptoms but patient's activity is limited by knee pain.  12/2019 had CT cardiac scoring which showed 0 calcium score, coronary CT showed no evidence of CAD   Essential hypertension - continue medications, DASH diet, exercise and monitor at home. Call if greater than 130/80.  - CBC with Differential/Platelet - BASIC METABOLIC PANEL WITH GFR - Hepatic function panel - Urinalysis, Routine w reflex microscopic (not at [facility]) - Microalbumin / creatinine urine ratio - EKG 12-Lead  3 mm Left lower lobe pulmonary nodule Found on CT 10/2019 Non-contrast chest CT was to be ordered if pt history warranted,  - will schedule non contrast chest CT  Thyroid disease Hypothyroidism- Has not had TSH checked since 2020 and level was .07  check TSH level, will adjust dose pending TSH result today, reminded to take on an empty stomach 30-23mins before food. Can not tolerate generic, will send to mail order pharmacy for brand name - TSH  Prediabetes- in DM range last visit Discussed general issues about diabetes pathophysiology and management., Educational material distributed., Suggested low cholesterol diet., Encouraged aerobic exercise., Discussed foot care., Reminded to get yearly retinal exam.Last A1C 07/20/19 was 6.9 - Hemoglobin A1c   Hyperlipidemia -No current medication decrease fatty foods, increase activity.  Declines medications, strong family history CT Coronary calcium score 10/2019 was 0 - Lipid panel  Vitamin D deficiency - Vit D  25 hydroxy (rtn osteoporosis monitoring)  Medication management - Magnesium   Bilateral fibrocystic breast disease (FCBD) 3D mammogram 08/20/2020 benign to follow up 1  year, pt to schedule   Nonalcoholic fatty liver disease Check labs, avoid tylenol, alcohol, weight loss advised.  - Hepatic function panel  History of nephrolithiasis Increase water  Insomnia - ALPRAZolam (XANAX) PRN  Obesity, Class II, BMI 35-39.9, with comorbidity ([place]) - follow up 3 months for progress monitoring - increase veggies, decrease carbs - long discussion about weight loss, diet, and exercise   Encounter for general adult medical examination with abnormal findings Continue 3D MGM Last Pap 07/19/2020 Normal- not due until 2024 1 year  Vitamin D deficiency -     VITAMIN D 25 Hydroxy (Vit-D Deficiency, Fractures)  Medication management -     Magnesium        Discussed med's effects and SE's. Screening labs and tests as requested with regular follow-up as recommended.  Future Appointments  Date Time Provider [place]  05/18/2022 10:00 AM Magda Bernheim, NP [facility] None     HPI 60 y.o. female  presents for a complete physical. She has had elevated blood pressure since 2000. Her blood pressure has been controlled at home, today their BP is BP: 136/82   BMI is Body mass index is 38.24 kg/m., she is working on diet and exercise. Could not tolerate phentermine.  Wt Readings from Last 3 Encounters:  05/18/21 222 lb 12.8 oz (101.1 kg)  10/05/19 196 lb (88.9 kg)  08/10/19 206 lb (93.4 kg)   She does workout, walks 5 miles a day. She denies chest pain, shortness of breath, dizziness.  Has had left knee pain so not walking as much, see Dr. Tonita Cong and going to have arthroscopy on Feb 25th.  She is not on cholesterol medication and  denies myalgias. Her cholesterol is at goal. Pt has been eating less fat and getting more exercise. The cholesterol last visit was:   Lab Results  Component Value Date   CHOL 182 07/20/2019   HDL 38 (L) 07/20/2019   LDLCALC 117 (H) 07/20/2019   TRIG 157 (H) 07/20/2019   CHOLHDL 4.8 07/20/2019    She has been working on  diet and exercise for prediabetes, and denies paresthesia of the feet, polydipsia and polyuria. Last A1C in the office was:  Lab Results  Component Value Date   HGBA1C 6.9 (H) 07/20/2019   Lab Results  Component Value Date   GFRNONAA 95 12/30/2019   Patient is on Vitamin D supplement,  5000 every day.    She is on thyroid medication. Her medication was changed last visit, she is on 200 mcg daily. Has not had a TSH drawn since 07/2019. Is taking 219mcg daily M,W F, Sat and 1/2 pill T, TH, Sun. Had an allergic reactions to the generic synthroid, her insurance is not covering the brand name. Will send directly to synthroid.  Lab Results  Component Value Date   TSH 0.07 (L) 08/10/2019  .  Has a history of kidney stones, follows with Dr. Matilde Sprang.  Very rare trouble sleeping, takes xanax PRN if this happens.  Sister and mother passed in 10/14/2014, stroke and sudden death due heart valve disease.  Has had 3 sisters with carotid artery disease, have had stroke, 70's, upper 60's, smokers, she denies symptoms of dizziness, vision changes- explained that we will monitor and control risk factors but no need for further testing at this time. .  Current Medications:  Current Outpatient Medications on File Prior to Visit  Medication Sig   ALPRAZolam (XANAX) 0.5 MG tablet Take      1/2 - 1 tablet        2 - 3 x /day      ONLY       if needed for Anxiety Attack &  limit to 5 days /week to avoid Addiction & Dementia   aspirin 81 MG tablet Take 81 mg by mouth daily.   cholecalciferol (VITAMIN D) 1000 UNITS tablet Take 10,000 Units by mouth daily.    clotrimazole-betamethasone (LOTRISONE) cream Apply 1 application topically 2 (two) times daily.   diltiazem (CARDIZEM CD) 240 MG 24 hr capsule TAKE 1 CAPSULE BY MOUTH  DAILY FOR BLOOD PRESSURE   hydrochlorothiazide (HYDRODIURIL) 25 MG tablet Take      1 tablet      Daily        for BP & Fluid Retention /Ankle Swelling   Multiple Vitamin (MULTIVITAMIN WITH  MINERALS) TABS tablet Take 1 tablet by mouth daily.   Omega-3 Fatty Acids (FISH OIL PO) Take 1 capsule by mouth daily.   quinapril (ACCUPRIL) 40 MG tablet Take  1 tablet  Daily  for BP   SYNTHROID 200 MCG tablet TAKE 1 TABLET DAILY. - Last seen 08/10/2019 - 2sd Notice - Must have Office Visit before further Refills !   No current facility-administered medications on file prior to visit.    Health Maintenance:   Immunization History  Administered Date(s) Administered   Pneumococcal-Unspecified 04/10/2010   Td 11/03/2008   Tdap 12/20/2017   Tetanus: 2019 Pneumovax: 2011 Prevnar 69: age 32 Flu vaccine: will get in 10-15-23 this year Zostavax: N/A  Pap: 06/2019 normal needs repeat 2023 MGM: 3D mammogram 08/20/2020 benign to follow up 1 year, pt calling to schedule DEXA:  N/A Colonoscopy:12/ 2016 Dr. Deatra Ina Dr. Havery Moros removed 1 polyp , pt to schedule next colonoscopy  EGD: N/A CXR: DUE CT AB 2014 [place] AB 2014 fatty liver Catarracts removed bilaterally 2022  Medical History:  Past Medical History:  Diagnosis Date   Allergy    Anxiety    Arthritis    hip   Fatty liver disease, nonalcoholic 78/6767   Via U/S   GERD (gastroesophageal reflux disease)    Hyperlipidemia    Hypertension    Insomnia    Nephrolithiasis    Prediabetes    no meds - diet controlled   Thyroid disease    Vitamin D deficiency    Allergies Allergies  Allergen Reactions   Atenolol     Beta blockers. Pt doesn't remember there being a problem   Codeine Nausea Only   Klonopin [Clonazepam] Rash   Penicillins Rash    SURGICAL HISTORY She  has a past surgical history that includes Cholecystectomy; Tubal ligation; Lithotripsy (2016); and Breast biopsy (Left). FAMILY HISTORY Her family history includes Heart Problems (age of onset: 36) in her mother; Hypertension in her mother; Peripheral Artery Disease in her sister. SOCIAL HISTORY She  reports that she quit smoking about 27 years ago. She has a  10.00 pack-year smoking history. She has never used smokeless tobacco. She reports current alcohol use of about 2.0 standard drinks of alcohol per week. She reports that she does not use drugs.  Review of Systems  Constitutional: Negative.   HENT: Negative.    Eyes: Negative.   Respiratory:  Negative for cough, hemoptysis, shortness of breath and wheezing.   Cardiovascular:  Negative for chest pain, palpitations, orthopnea and leg swelling.  Gastrointestinal:  Negative for abdominal pain, constipation, diarrhea, heartburn, nausea and vomiting.  Genitourinary:  Negative for dysuria, frequency and urgency.  Musculoskeletal:  Negative for back pain, joint pain, myalgias and neck pain.  Skin: Negative.   Neurological:  Negative for dizziness, tremors, weakness and headaches.  Endo/Heme/Allergies:  Does not bruise/bleed easily.  Psychiatric/Behavioral:  Negative for depression, memory loss and suicidal ideas. The patient has insomnia (1/2 xanax 0.5 mg prn).     Physical Exam: Estimated body mass index is 38.24 kg/m as calculated from the following:   Height as of this encounter: 5\' 4"  (1.626 m).   Weight as of this encounter: 222 lb 12.8 oz (101.1 kg). BP 136/82   Pulse 70   Temp (!) 97.3 F (36.3 C)   Ht 5\' 4"  (1.626 m)   Wt 222 lb 12.8 oz (101.1 kg)   LMP 09/01/2013   SpO2 93%   BMI 38.24 kg/m  General Appearance: Well nourished, in no apparent distress. Eyes: PERRLA, EOMs, conjunctiva no swelling or erythema, normal fundi and vessels. Sinuses: No Frontal/maxillary tenderness ENT/Mouth: Ext aud canals clear, normal light reflex with TMs without erythema, bulging.  Good dentition. No erythema, swelling, or exudate on post pharynx. Tonsils not swollen or erythematous. Hearing normal.  Neck: Supple, thyroid normal. No bruits Respiratory: Respiratory effort normal, BS equal bilaterally without rales, rhonchi, wheezing or stridor. Cardio: RRR without murmurs, rubs or gallops. Brisk  peripheral pulses without edema.  Chest: symmetric, with normal excursions and percussion. Breasts: Symmetric lumps bilateral breast, without nipple discharge, retractions. Abdomen: Soft, +BS. Non tender, no guarding, rebound, hernias, masses, or organomegaly.  Lymphatics: Non tender without lymphadenopathy.  Genitourinary: VULVA: normal appearing vulva with no masses, tenderness or lesions, VAGINA: atrophic, vaginal erythema. Musculoskeletal: Full ROM all peripheral extremities,5/5 strength, and normal  gait. Skin: Warm, dry without rashes, lesions, ecchymosis.  Neuro: Cranial nerves intact, reflexes equal bilaterally. Normal muscle tone, no cerebellar symptoms. Sensation intact.  Psych: Awake and oriented X 3, normal affect, Insight and Judgment appropriate.   EKG: Normal Sinus Rhythm, no ST changes  [facility]  05/18/2021

## 2021-05-18 ENCOUNTER — Other Ambulatory Visit: Payer: Self-pay | Admitting: Internal Medicine

## 2021-05-18 ENCOUNTER — Other Ambulatory Visit: Payer: Self-pay

## 2021-05-18 ENCOUNTER — Ambulatory Visit (INDEPENDENT_AMBULATORY_CARE_PROVIDER_SITE_OTHER): Payer: 59 | Admitting: Nurse Practitioner

## 2021-05-18 ENCOUNTER — Encounter: Payer: Self-pay | Admitting: Nurse Practitioner

## 2021-05-18 VITALS — BP 136/82 | HR 70 | Temp 97.3°F | Ht 64.0 in | Wt 222.8 lb

## 2021-05-18 DIAGNOSIS — R911 Solitary pulmonary nodule: Secondary | ICD-10-CM

## 2021-05-18 DIAGNOSIS — Z1231 Encounter for screening mammogram for malignant neoplasm of breast: Secondary | ICD-10-CM

## 2021-05-18 DIAGNOSIS — I700 Atherosclerosis of aorta: Secondary | ICD-10-CM | POA: Diagnosis not present

## 2021-05-18 DIAGNOSIS — Z136 Encounter for screening for cardiovascular disorders: Secondary | ICD-10-CM

## 2021-05-18 DIAGNOSIS — E785 Hyperlipidemia, unspecified: Secondary | ICD-10-CM

## 2021-05-18 DIAGNOSIS — E079 Disorder of thyroid, unspecified: Secondary | ICD-10-CM

## 2021-05-18 DIAGNOSIS — K760 Fatty (change of) liver, not elsewhere classified: Secondary | ICD-10-CM

## 2021-05-18 DIAGNOSIS — E1169 Type 2 diabetes mellitus with other specified complication: Secondary | ICD-10-CM

## 2021-05-18 DIAGNOSIS — I10 Essential (primary) hypertension: Secondary | ICD-10-CM | POA: Diagnosis not present

## 2021-05-18 DIAGNOSIS — Z0000 Encounter for general adult medical examination without abnormal findings: Secondary | ICD-10-CM | POA: Diagnosis not present

## 2021-05-18 DIAGNOSIS — Z0001 Encounter for general adult medical examination with abnormal findings: Secondary | ICD-10-CM

## 2021-05-18 DIAGNOSIS — Z79899 Other long term (current) drug therapy: Secondary | ICD-10-CM

## 2021-05-18 DIAGNOSIS — E559 Vitamin D deficiency, unspecified: Secondary | ICD-10-CM

## 2021-05-18 MED ORDER — HYDROCHLOROTHIAZIDE 25 MG PO TABS: ORAL_TABLET | ORAL | 3 refills | Status: DC

## 2021-05-18 NOTE — Patient Instructions (Signed)
Will schedule non contrast chest CT for evaluation of 3 mm nodule left lower lung- will be set up by our referral coordinator who will check your insurance **Schedule Mammorgram **Schedule Colonoscopy **Will decide thyroid dose once today's TSH is reviewed   There are several categories of common medications that should be avoided in patients with LQTS including:  Antibiotics - macrolides, fluoroquinolones, trimethoprim-sulfa Antifungals - some azole antifungals Antihistamines - terfenadine and astemizole have been withdrawn from the market because of this risk Antiemetics and promotility agents - ondansteron, phenergen, cisapride Cough/cold medications - pseudoephedrine Stimulant medications - methylphenidate Pain and sedatives - chloral hydrate, oxycodone, methadone Anti-depressants - including tricyclic antidepressants and some selective serotonin reuptake inhibitors including citalopram, fluoxetine. Other groups include antiarrhythmic drugs, antineoplastic drugs, diuretics (because of electrolyte abnormalities), other psychotropic medications and even some herbs.

## 2021-05-19 ENCOUNTER — Other Ambulatory Visit: Payer: Self-pay

## 2021-05-19 ENCOUNTER — Ambulatory Visit
Admission: RE | Admit: 2021-05-19 | Discharge: 2021-05-19 | Disposition: A | Discharge status: Home / Self Care | Payer: 59 | Source: Ambulatory Visit | Attending: Internal Medicine | Admitting: Internal Medicine

## 2021-05-19 ENCOUNTER — Other Ambulatory Visit: Payer: Self-pay | Admitting: Internal Medicine

## 2021-05-19 DIAGNOSIS — Z1231 Encounter for screening mammogram for malignant neoplasm of breast: Secondary | ICD-10-CM

## 2021-05-19 DIAGNOSIS — G4700 Insomnia, unspecified: Secondary | ICD-10-CM

## 2021-05-19 DIAGNOSIS — E079 Disorder of thyroid, unspecified: Secondary | ICD-10-CM

## 2021-05-19 LAB — CBC WITH DIFFERENTIAL/PLATELET
Absolute Monocytes: 626 cells/uL (ref 200–950)
Basophils Absolute: 110 cells/uL (ref 0–200)
Basophils Relative: 1.2 %
Eosinophils Absolute: 267 cells/uL (ref 15–500)
Eosinophils Relative: 2.9 %
HCT: 39.3 % (ref 35.0–45.0)
Hemoglobin: 13 g/dL (ref 11.7–15.5)
Lymphs Abs: 2714 cells/uL (ref 850–3900)
MCH: 29.1 pg (ref 27.0–33.0)
MCHC: 33.1 g/dL (ref 32.0–36.0)
MCV: 88.1 fL (ref 80.0–100.0)
MPV: 10.8 fL (ref 7.5–12.5)
Monocytes Relative: 6.8 %
Neutro Abs: 5483 cells/uL (ref 1500–7800)
Neutrophils Relative %: 59.6 %
Platelets: 241 10*3/uL (ref 140–400)
RBC: 4.46 10*6/uL (ref 3.80–5.10)
RDW: 14 % (ref 11.0–15.0)
Total Lymphocyte: 29.5 %
WBC: 9.2 10*3/uL (ref 3.8–10.8)

## 2021-05-19 LAB — URINALYSIS, ROUTINE W REFLEX MICROSCOPIC
Bilirubin Urine: NEGATIVE
Glucose, UA: NEGATIVE
Hgb urine dipstick: NEGATIVE
Ketones, ur: NEGATIVE
Leukocytes,Ua: NEGATIVE
Nitrite: NEGATIVE
Protein, ur: NEGATIVE
Specific Gravity, Urine: 1.004 (ref 1.001–1.035)
pH: 7 (ref 5.0–8.0)

## 2021-05-19 LAB — HEMOGLOBIN A1C
Hgb A1c MFr Bld: 9.3 % of total Hgb — ABNORMAL HIGH (ref <5.7)
Mean Plasma Glucose: 220 mg/dL
eAG (mmol/L): 12.2 mmol/L

## 2021-05-19 LAB — COMPLETE METABOLIC PANEL WITH GFR
AG Ratio: 1.1 (calc) (ref 1.0–2.5)
ALT: 31 U/L — ABNORMAL HIGH (ref 6–29)
AST: 55 U/L — ABNORMAL HIGH (ref 10–35)
Albumin: 3.9 g/dL (ref 3.6–5.1)
Alkaline phosphatase (APISO): 163 U/L — ABNORMAL HIGH (ref 37–153)
BUN: 9 mg/dL (ref 7–25)
CO2: 29 mmol/L (ref 20–32)
Calcium: 9.6 mg/dL (ref 8.6–10.4)
Chloride: 99 mmol/L (ref 98–110)
Creat: 0.56 mg/dL (ref 0.50–0.99)
GFR, Est African American: 117 mL/min/{1.73_m2} (ref >=60)
GFR, Est Non African American: 101 mL/min/{1.73_m2} (ref >=60)
Globulin: 3.7 g/dL (calc) (ref 1.9–3.7)
Glucose, Bld: 111 mg/dL — ABNORMAL HIGH (ref 65–99)
Potassium: 4.4 mmol/L (ref 3.5–5.3)
Sodium: 137 mmol/L (ref 135–146)
Total Bilirubin: 0.8 mg/dL (ref 0.2–1.2)
Total Protein: 7.6 g/dL (ref 6.1–8.1)

## 2021-05-19 LAB — MICROALBUMIN / CREATININE URINE RATIO
Creatinine, Urine: 14 mg/dL — ABNORMAL LOW (ref 20–275)
Microalb, Ur: <0.2 mg/dL

## 2021-05-19 LAB — HEPATIC FUNCTION PANEL
AG Ratio: 1.1 (calc) (ref 1.0–2.5)
ALT: 31 U/L — ABNORMAL HIGH (ref 6–29)
AST: 55 U/L — ABNORMAL HIGH (ref 10–35)
Albumin: 3.9 g/dL (ref 3.6–5.1)
Alkaline phosphatase (APISO): 163 U/L — ABNORMAL HIGH (ref 37–153)
Bilirubin, Direct: 0.2 mg/dL (ref 0.0–0.2)
Globulin: 3.7 g/dL (calc) (ref 1.9–3.7)
Indirect Bilirubin: 0.6 mg/dL (calc) (ref 0.2–1.2)
Total Bilirubin: 0.8 mg/dL (ref 0.2–1.2)
Total Protein: 7.6 g/dL (ref 6.1–8.1)

## 2021-05-19 LAB — LIPID PANEL
Cholesterol: 197 mg/dL (ref <200)
HDL: 33 mg/dL — ABNORMAL LOW (ref >=50)
LDL Cholesterol (Calc): 133 mg/dL (calc) — ABNORMAL HIGH
Non-HDL Cholesterol (Calc): 164 mg/dL (calc) — ABNORMAL HIGH (ref <130)
Total CHOL/HDL Ratio: 6 (calc) — ABNORMAL HIGH (ref <5.0)
Triglycerides: 179 mg/dL — ABNORMAL HIGH (ref <150)

## 2021-05-19 LAB — VITAMIN D 25 HYDROXY (VIT D DEFICIENCY, FRACTURES): Vit D, 25-Hydroxy: 67 ng/mL (ref 30–100)

## 2021-05-19 LAB — TSH: TSH: 11.97 mIU/L — ABNORMAL HIGH (ref 0.40–4.50)

## 2021-05-19 LAB — MAGNESIUM: Magnesium: 1.8 mg/dL (ref 1.5–2.5)

## 2021-05-19 MED ORDER — SYNTHROID 200 MCG PO TABS: ORAL_TABLET | ORAL | 2 refills | Status: DC

## 2021-05-23 ENCOUNTER — Other Ambulatory Visit: Payer: Self-pay | Admitting: Nurse Practitioner

## 2021-05-23 DIAGNOSIS — E1169 Type 2 diabetes mellitus with other specified complication: Secondary | ICD-10-CM

## 2021-05-23 DIAGNOSIS — E785 Hyperlipidemia, unspecified: Secondary | ICD-10-CM

## 2021-05-23 MED ORDER — METFORMIN HCL ER 500 MG PO TB24: 500.0000 mg | ORAL_TABLET | Freq: Three times a day (TID) | ORAL | Status: DC

## 2021-05-23 MED ORDER — METFORMIN HCL ER 500 MG PO TB24
ORAL_TABLET | ORAL | 11 refills | Status: DC
Start: 2021-05-23 — End: 2022-05-31

## 2021-06-16 ENCOUNTER — Ambulatory Visit
Admission: RE | Admit: 2021-06-16 | Discharge: 2021-06-16 | Disposition: A | Discharge status: Home / Self Care | Payer: 59 | Source: Ambulatory Visit | Attending: Nurse Practitioner | Admitting: Nurse Practitioner

## 2021-06-16 ENCOUNTER — Other Ambulatory Visit: Payer: Self-pay

## 2021-06-16 DIAGNOSIS — R911 Solitary pulmonary nodule: Secondary | ICD-10-CM

## 2021-06-29 ENCOUNTER — Ambulatory Visit (INDEPENDENT_AMBULATORY_CARE_PROVIDER_SITE_OTHER): Payer: 59

## 2021-06-29 ENCOUNTER — Other Ambulatory Visit: Payer: Self-pay

## 2021-06-29 VITALS — BP 146/98 | HR 60 | Temp 97.9°F | Ht 64.0 in | Wt 213.2 lb

## 2021-06-29 DIAGNOSIS — E079 Disorder of thyroid, unspecified: Secondary | ICD-10-CM | POA: Diagnosis not present

## 2021-06-29 NOTE — Progress Notes (Signed)
Patient came in for a lab only nurse visit today to recheck her TSH.   When are you taking your medication? Per patient she is taking her medication first thing in the morning.   What thyroid medication are you taking? Per patient she is taking Synthroid (Levothyroxine Sodium tab) 200 mg once daily in the morning.   Are you taking your medication with water? Per patient she is taking her medication with a 16 oz bottle of water.   How long are you waiting to eat after taking your Synthroid? Per patient she is waiting one complete hour before consuming food after taking her Synthroid.   Are you taking Synthroid with any biotin or biotin related products? Per patient she is not taking either a biotin or biotin related product at this time.

## 2021-06-30 LAB — TSH: TSH: 1.01 mIU/L (ref 0.40–4.50)

## 2021-07-18 ENCOUNTER — Other Ambulatory Visit: Payer: Self-pay

## 2021-07-18 ENCOUNTER — Ambulatory Visit: Payer: 59 | Admitting: Nurse Practitioner

## 2021-07-18 ENCOUNTER — Encounter: Payer: Self-pay | Admitting: Nurse Practitioner

## 2021-07-18 VITALS — BP 130/80 | HR 84 | Temp 97.4°F | Wt 210.4 lb

## 2021-07-18 DIAGNOSIS — R300 Dysuria: Secondary | ICD-10-CM

## 2021-07-18 DIAGNOSIS — Z6836 Body mass index (BMI) 36.0-36.9, adult: Secondary | ICD-10-CM

## 2021-07-18 DIAGNOSIS — E661 Drug-induced obesity: Secondary | ICD-10-CM | POA: Diagnosis not present

## 2021-07-18 MED ORDER — NITROFURANTOIN MONOHYD MACRO 100 MG PO CAPS: 100.0000 mg | ORAL_CAPSULE | Freq: Two times a day (BID) | ORAL | 0 refills | Status: AC

## 2021-07-18 NOTE — Progress Notes (Signed)
Assessment and Plan:  Domingue was seen today for urinary tract infection.  Diagnoses and all orders for this visit:  Dysuria -     nitrofurantoin, macrocrystal-monohydrate, (MACROBID) 100 MG capsule; Take 1 capsule (100 mg total) by mouth 2 (two) times daily for 7 days. -     Urine Culture -     Urinalysis, Routine w reflex microscopic  Class 2 drug-induced obesity with serious comorbidity and body mass index (BMI) of 36.0 to 36.9 in adult        Pt strongly encouraged to continue diet and exercise, has lost 12 pounds in 2 months      Further disposition pending results of labs. Discussed med's effects and SE's.   Over 30 minutes of exam, counseling, chart review, and critical decision making was performed.   Future Appointments  Date Time Provider [place]  08/23/2021  4:00 PM Magda Bernheim, NP [facility] None  05/18/2022 10:00 AM Magda Bernheim, NP [facility] None    ------------------------------------------------------------------------------------------------------------------   HPI BP 130/80   Pulse 84   Temp (!) 97.4 F (36.3 C)   Wt 210 lb 6.4 oz (95.4 kg)   LMP 09/01/2013   SpO2 96%   BMI 36.12 kg/m  60 y.o.female presents for urinary frequency, urgency and dysuria.  Denies blood in urine.  Symptoms started 4 days ago.   BMI is Body mass index is 36.12 kg/m., she has been working on diet and exercise. Wt Readings from Last 3 Encounters:  07/18/21 210 lb 6.4 oz (95.4 kg)  06/29/21 213 lb 3.2 oz (96.7 kg)  05/18/21 222 lb 12.8 oz (101.1 kg)    Past Medical History:  Diagnosis Date   Allergy    Anxiety    Arthritis    hip   Fatty liver disease, nonalcoholic 99991111   Via U/S   GERD (gastroesophageal reflux disease)    Hyperlipidemia    Hypertension    Insomnia    Nephrolithiasis    Prediabetes    no meds - diet controlled   Thyroid disease    Vitamin D deficiency      Allergies  Allergen Reactions   Atenolol     Beta blockers. Pt doesn't  remember there being a problem   Codeine Nausea Only   Klonopin [Clonazepam] Rash   Penicillins Rash    Current Outpatient Medications on File Prior to Visit  Medication Sig   ALPRAZolam (XANAX) 0.5 MG tablet TAKE 1/2 TO 1 TABLET 2 TO 3 TIMES DAILY ONLY IF NEEDED  FOR ANXIETY ATTACK, LIMIT  TO 5 DAYS A WEEK TO AVOID  ADDICTION AND DEMENTIA   aspirin 81 MG tablet Take 81 mg by mouth daily.   cholecalciferol (VITAMIN D) 1000 UNITS tablet Take 10,000 Units by mouth daily.    clotrimazole-betamethasone (LOTRISONE) cream Apply 1 application topically 2 (two) times daily.   diltiazem (CARDIZEM CD) 240 MG 24 hr capsule TAKE 1 CAPSULE BY MOUTH  DAILY FOR BLOOD PRESSURE   hydrochlorothiazide (HYDRODIURIL) 25 MG tablet Take 1 tablet Daily for BP & Fluid Retention /Ankle Swelling   metFORMIN (GLUCOPHAGE XR) 500 MG 24 hr tablet Start with 1 tab with largest meal of the day and slowly increase as tolerated to 4 times a day with meals for blood sugar control   Multiple Vitamin (MULTIVITAMIN WITH MINERALS) TABS tablet Take 1 tablet by mouth daily.   Omega-3 Fatty Acids (FISH OIL PO) Take 1 capsule by mouth daily.   quinapril (ACCUPRIL) 40 MG  tablet Take  1 tablet  Daily  for BP   SYNTHROID 200 MCG tablet TAKE ONE TABLET DAILY FOR THYROID   No current facility-administered medications on file prior to visit.    ROS: all negative except above.   Physical Exam:  BP 130/80   Pulse 84   Temp (!) 97.4 F (36.3 C)   Wt 210 lb 6.4 oz (95.4 kg)   LMP 09/01/2013   SpO2 96%   BMI 36.12 kg/m   General Appearance: Well nourished, in no apparent distress. Eyes: PERRLA, EOMs, conjunctiva no swelling or erythema Sinuses: No Frontal/maxillary tenderness ENT/Mouth: Ext aud canals clear, TMs without erythema, bulging. No erythema, swelling, or exudate on post pharynx.  Tonsils not swollen or erythematous. Hearing normal.  Neck: Supple, thyroid normal.  Respiratory: Respiratory effort normal, BS equal  bilaterally without rales, rhonchi, wheezing or stridor.  Cardio: RRR with no MRGs. Brisk peripheral pulses without edema.  Abdomen: Soft, + BS.  Non tender, no guarding, rebound, hernias, masses. Lymphatics: Non tender without lymphadenopathy.  Musculoskeletal: Full ROM, 5/5 strength, normal gait. No CVA tenderness Skin: Warm, dry without rashes, lesions, ecchymosis.  Neuro: Cranial nerves intact. Normal muscle tone, no cerebellar symptoms. Sensation intact.  Psych: Awake and oriented X 3, normal affect, Insight and Judgment appropriate.     Magda Bernheim, NP 3:31 PM [facility]

## 2021-07-18 NOTE — Telephone Encounter (Signed)
Please schedule pt at 3 for UTI

## 2021-07-20 LAB — URINALYSIS, ROUTINE W REFLEX MICROSCOPIC
Bacteria, UA: NONE SEEN /HPF
Bilirubin Urine: NEGATIVE
Glucose, UA: NEGATIVE
Hgb urine dipstick: NEGATIVE
Hyaline Cast: NONE SEEN /LPF
Ketones, ur: NEGATIVE
Nitrite: NEGATIVE
Protein, ur: NEGATIVE
RBC / HPF: NONE SEEN /HPF (ref 0–2)
Specific Gravity, Urine: 1.004 (ref 1.001–1.035)
pH: 6.5 (ref 5.0–8.0)

## 2021-07-20 LAB — MICROSCOPIC MESSAGE

## 2021-07-20 LAB — URINE CULTURE
MICRO NUMBER:: 12310081
SPECIMEN QUALITY:: ADEQUATE

## 2021-07-28 ENCOUNTER — Encounter: Payer: Self-pay | Admitting: Gastroenterology

## 2021-08-10 ENCOUNTER — Other Ambulatory Visit: Payer: Self-pay | Admitting: Adult Health

## 2021-08-10 DIAGNOSIS — G4700 Insomnia, unspecified: Secondary | ICD-10-CM

## 2021-08-17 ENCOUNTER — Other Ambulatory Visit: Payer: Self-pay

## 2021-08-17 DIAGNOSIS — G4700 Insomnia, unspecified: Secondary | ICD-10-CM

## 2021-08-17 MED ORDER — ALPRAZOLAM 0.5 MG PO TABS: ORAL_TABLET | ORAL | 0 refills | Status: DC

## 2021-08-22 NOTE — Progress Notes (Signed)
3 MONTH FOLLOW UP  Assessment and Plan:  Shannon Pope was seen today for follow-up.  Diagnoses and all orders for this visit:  Type 2 diabetes mellitus with hyperlipidemia ([place])  Continue metformin, Diet and exercise.  Abdominal aortic atherosclerosis (HCC)  Control blood pressure, cholesterol and weight  Type 2 diabetes mellitus treated without insulin (HCC) -     Hemoglobin A1c -     Microalbumin / creatinine urine ratio - Continue Metformin, diet and exercise  Hyperlipidemia, unspecified hyperlipidemia type -     CBC with Differential/Platelet -     COMPLETE METABOLIC PANEL WITH GFR -     Lipid panel - Continue diet and exercise  Essential hypertension -     CBC with Differential/Platelet -     Microalbumin / creatinine urine ratio  - continue medications, DASH diet, exercise and monitor at home. Call if greater than 130/80.    Class 2 drug-induced obesity with serious comorbidity and body mass index (BMI) of 34- 34.9  Pt has lost 15 pounds in last 2 months  Continue diet and exercise  Hypothyroidism, unspecified type -     TSH - Continue current dose of Synthroid(requires brand name due to allergic reaction with generic), will adjust pending lab results Please take your thyroid medication greater than 30 min before breakfast, separated by at least 4 hours  from antacids, calcium, iron, and multivitamins.        Continue diet and meds as discussed. Further disposition pending results of labs. Discussed med's effects and SE's.   Over 30 minutes of face to face interview, exam, counseling, chart review, and critical decision making was performed.  Future Appointments  Date Time Provider [place]  09/12/2021 11:00 AM [facility] PREVISIT RM 50 [facility]  09/26/2021  9:00 AM Armbruster, Carlota Raspberry, MD [facility]  05/18/2022 10:00 AM Magda Bernheim, NP [facility] None     HPI 60 y.o. female  presents for a complete physical. She has had elevated blood  pressure since 2000. Her blood pressure has been controlled at home, today their BP is BP: 122/70   BMI is Body mass index is 33.99 kg/m., she is working on diet and exercise. Could not tolerate phentermine.  Wt Readings from Last 3 Encounters:  08/23/21 198 lb (89.8 kg)  07/18/21 210 lb 6.4 oz (95.4 kg)  06/29/21 213 lb 3.2 oz (96.7 kg)   She does workout, walks 5 miles a day. She denies chest pain, shortness of breath, dizziness.   She is not on cholesterol medication and denies myalgias. Her cholesterol is at goal. Pt has been eating less fat and getting more exercise. The cholesterol last visit was:   Lab Results  Component Value Date   CHOL 197 05/18/2021   HDL 33 (L) 05/18/2021   LDLCALC 133 (H) 05/18/2021   TRIG 179 (H) 05/18/2021   CHOLHDL 6.0 (H) 05/18/2021    She has been working on diet and exercise for prediabetes, and denies paresthesia of the feet, polydipsia and polyuria. Currently taking Metformin 1 tab 4 times a day. Last A1C in the office was:  Lab Results  Component Value Date   HGBA1C 9.3 (H) 05/18/2021   Lab Results  Component Value Date   GFRNONAA 101 05/18/2021   Patient is on Vitamin D supplement,  5000 every day.    She is on thyroid medication. Her medication was changed last visit, she is on 200 mcg daily. Had an allergic reactions to the generic synthroid, her  insurance is not covering the brand name. Will send directly to synthroid.  Lab Results  Component Value Date   TSH 1.01 06/29/2021  .  Has a history of kidney stones, follows with Dr. Matilde Sprang.  Very rare trouble sleeping, takes xanax PRN if this happens.  Sister and mother passed in 10/31/14, stroke and sudden death due heart valve disease.  Has had 3 sisters with carotid artery disease, have had stroke, 70's, upper 60's, smokers, she denies symptoms of dizziness, vision changes- explained that we will monitor and control risk factors but no need for further testing at this time. . Coronary  Calcium Score 10/2019 was 0.  Current Medications:  Current Outpatient Medications on File Prior to Visit  Medication Sig   ALPRAZolam (XANAX) 0.5 MG tablet TAKE 1/2 TO 1 TABLET 2 TO 3 TIMES DAILY ONLY IF NEEDED  FOR ANXIETY ATTACK, LIMIT  TO 5 DAYS A WEEK TO AVOID  ADDICTION AND DEMENTIA   aspirin 81 MG tablet Take 81 mg by mouth daily.   cholecalciferol (VITAMIN D) 1000 UNITS tablet Take 10,000 Units by mouth daily.    clotrimazole-betamethasone (LOTRISONE) cream Apply 1 application topically 2 (two) times daily.   diltiazem (CARDIZEM CD) 240 MG 24 hr capsule TAKE 1 CAPSULE BY MOUTH  DAILY FOR BLOOD PRESSURE   hydrochlorothiazide (HYDRODIURIL) 25 MG tablet Take 1 tablet Daily for BP & Fluid Retention /Ankle Swelling   metFORMIN (GLUCOPHAGE XR) 500 MG 24 hr tablet Start with 1 tab with largest meal of the day and slowly increase as tolerated to 4 times a day with meals for blood sugar control   Multiple Vitamin (MULTIVITAMIN WITH MINERALS) TABS tablet Take 1 tablet by mouth daily.   Omega-3 Fatty Acids (FISH OIL PO) Take 1 capsule by mouth daily.   quinapril (ACCUPRIL) 40 MG tablet Take  1 tablet  Daily  for BP   SYNTHROID 200 MCG tablet TAKE ONE TABLET DAILY FOR THYROID   zinc gluconate 50 MG tablet Take 50 mg by mouth daily.   No current facility-administered medications on file prior to visit.    Health Maintenance:   Immunization History  Administered Date(s) Administered   Pneumococcal-Unspecified 04/10/2010   Td 11/03/2008   Tdap 12/20/2017   Tetanus: 2019 Pneumovax: 2011 Prevnar 30: age 29 Flu vaccine: will get in 11-01-2023 this year Zostavax: N/A  Pap: 06/2019 normal needs repeat 2023 MGM: 3D mammogram 08/20/2020 benign to follow up 1 year, pt calling to schedule DEXA: N/A Colonoscopy:12/ 2016 Dr. Deatra Ina Dr. Havery Moros removed 1 polyp , pt to schedule next colonoscopy  EGD: N/A CXR: DUE CT AB 2014 [place] AB 2014 fatty liver Catarracts removed bilaterally 2022  Medical  History:  Past Medical History:  Diagnosis Date   Allergy    Anxiety    Arthritis    hip   Fatty liver disease, nonalcoholic 22/9798   Via U/S   GERD (gastroesophageal reflux disease)    Hyperlipidemia    Hypertension    Insomnia    Nephrolithiasis    Prediabetes    no meds - diet controlled   Thyroid disease    Vitamin D deficiency    Allergies Allergies  Allergen Reactions   Atenolol     Beta blockers. Pt doesn't remember there being a problem   Codeine Nausea Only   Klonopin [Clonazepam] Rash   Penicillins Rash    SURGICAL HISTORY She  has a past surgical history that includes Cholecystectomy; Tubal ligation; Lithotripsy (2016); and Breast  biopsy (Left). FAMILY HISTORY Her family history includes Heart Problems (age of onset: 28) in her mother; Hypertension in her mother; Peripheral Artery Disease in her sister. SOCIAL HISTORY She  reports that she quit smoking about 27 years ago. Her smoking use included cigarettes. She has a 10.00 pack-year smoking history. She has never used smokeless tobacco. She reports current alcohol use of about 2.0 standard drinks per week. She reports that she does not use drugs.  Review of Systems  Constitutional: Negative.  Negative for chills, fever and weight loss.  HENT: Negative.  Negative for congestion and hearing loss.   Eyes: Negative.  Negative for blurred vision and double vision.  Respiratory:  Negative for cough, hemoptysis, shortness of breath and wheezing.   Cardiovascular:  Negative for chest pain, palpitations, orthopnea and leg swelling.  Gastrointestinal:  Negative for abdominal pain, constipation, diarrhea, heartburn, nausea and vomiting.  Genitourinary:  Negative for dysuria, frequency and urgency.  Musculoskeletal:  Negative for back pain, falls, joint pain, myalgias and neck pain.  Skin: Negative.  Negative for rash.  Neurological:  Negative for dizziness, tingling, tremors, loss of consciousness, weakness and  headaches.  Endo/Heme/Allergies:  Does not bruise/bleed easily.  Psychiatric/Behavioral:  Negative for depression, memory loss and suicidal ideas. The patient has insomnia (1/2 xanax 0.5 mg prn).     Physical Exam: Estimated body mass index is 33.99 kg/m as calculated from the following:   Height as of 06/29/21: 5\' 4"  (1.626 m).   Weight as of this encounter: 198 lb (89.8 kg). BP 122/70   Pulse 87   Temp (!) 97.3 F (36.3 C)   Wt 198 lb (89.8 kg)   LMP 09/01/2013   SpO2 95%   BMI 33.99 kg/m  General Appearance: Well nourished, in no apparent distress. Eyes: PERRLA, EOMs, conjunctiva no swelling or erythema, normal fundi and vessels. Sinuses: No Frontal/maxillary tenderness ENT/Mouth: Ext aud canals clear, normal light reflex with TMs without erythema, bulging.  Good dentition. No erythema, swelling, or exudate on post pharynx. Tonsils not swollen or erythematous. Hearing normal.  Neck: Supple, thyroid normal. No bruits Respiratory: Respiratory effort normal, BS equal bilaterally without rales, rhonchi, wheezing or stridor. Cardio: RRR without murmurs, rubs or gallops. Brisk peripheral pulses without edema.  Chest: symmetric, with normal excursions and percussion. Abdomen: Soft, +BS. Non tender, no guarding, rebound, hernias, masses, or organomegaly.  Lymphatics: Non tender without lymphadenopathy.  Musculoskeletal: Full ROM all peripheral extremities,5/5 strength, and normal gait. Skin: Warm, dry without rashes, lesions, ecchymosis.  Neuro: Cranial nerves intact, reflexes equal bilaterally. Normal muscle tone, no cerebellar symptoms. Sensation intact.  Psych: Awake and oriented X 3, normal affect, Insight and Judgment appropriate.     Magda Bernheim ANP-C  [facility]  08/23/2021

## 2021-08-23 ENCOUNTER — Other Ambulatory Visit: Payer: Self-pay

## 2021-08-23 ENCOUNTER — Ambulatory Visit: Payer: 59 | Admitting: Nurse Practitioner

## 2021-08-23 ENCOUNTER — Encounter: Payer: Self-pay | Admitting: Nurse Practitioner

## 2021-08-23 VITALS — BP 122/70 | HR 87 | Temp 97.3°F | Wt 198.0 lb

## 2021-08-23 DIAGNOSIS — E119 Type 2 diabetes mellitus without complications: Secondary | ICD-10-CM

## 2021-08-23 DIAGNOSIS — I700 Atherosclerosis of aorta: Secondary | ICD-10-CM

## 2021-08-23 DIAGNOSIS — E039 Hypothyroidism, unspecified: Secondary | ICD-10-CM

## 2021-08-23 DIAGNOSIS — E661 Drug-induced obesity: Secondary | ICD-10-CM

## 2021-08-23 DIAGNOSIS — I10 Essential (primary) hypertension: Secondary | ICD-10-CM

## 2021-08-23 DIAGNOSIS — E1169 Type 2 diabetes mellitus with other specified complication: Secondary | ICD-10-CM | POA: Diagnosis not present

## 2021-08-23 DIAGNOSIS — E785 Hyperlipidemia, unspecified: Secondary | ICD-10-CM

## 2021-08-23 NOTE — Patient Instructions (Signed)

## 2021-08-24 LAB — COMPLETE METABOLIC PANEL WITH GFR
AG Ratio: 1.3 (calc) (ref 1.0–2.5)
ALT: 26 U/L (ref 6–29)
AST: 45 U/L — ABNORMAL HIGH (ref 10–35)
Albumin: 4.2 g/dL (ref 3.6–5.1)
Alkaline phosphatase (APISO): 139 U/L (ref 37–153)
BUN: 13 mg/dL (ref 7–25)
CO2: 25 mmol/L (ref 20–32)
Calcium: 9.6 mg/dL (ref 8.6–10.4)
Chloride: 99 mmol/L (ref 98–110)
Creat: 0.53 mg/dL (ref 0.50–1.05)
Globulin: 3.3 g/dL (calc) (ref 1.9–3.7)
Glucose, Bld: 101 mg/dL (ref 65–139)
Potassium: 4 mmol/L (ref 3.5–5.3)
Sodium: 137 mmol/L (ref 135–146)
Total Bilirubin: 0.4 mg/dL (ref 0.2–1.2)
Total Protein: 7.5 g/dL (ref 6.1–8.1)
eGFR: 105 mL/min/{1.73_m2} (ref >=60)

## 2021-08-24 LAB — LIPID PANEL
Cholesterol: 157 mg/dL (ref <200)
HDL: 30 mg/dL — ABNORMAL LOW (ref >=50)
LDL Cholesterol (Calc): 91 mg/dL (calc)
Non-HDL Cholesterol (Calc): 127 mg/dL (calc) (ref <130)
Total CHOL/HDL Ratio: 5.2 (calc) — ABNORMAL HIGH (ref <5.0)
Triglycerides: 275 mg/dL — ABNORMAL HIGH (ref <150)

## 2021-08-24 LAB — MICROALBUMIN / CREATININE URINE RATIO
Creatinine, Urine: 42 mg/dL (ref 20–275)
Microalb Creat Ratio: 17 mcg/mg creat (ref <30)
Microalb, Ur: 0.7 mg/dL

## 2021-08-24 LAB — HEMOGLOBIN A1C
Hgb A1c MFr Bld: 5.8 % of total Hgb — ABNORMAL HIGH (ref <5.7)
Mean Plasma Glucose: 120 mg/dL
eAG (mmol/L): 6.6 mmol/L

## 2021-08-24 LAB — CBC WITH DIFFERENTIAL/PLATELET
Absolute Monocytes: 722 cells/uL (ref 200–950)
Basophils Absolute: 84 cells/uL (ref 0–200)
Basophils Relative: 1.1 %
Eosinophils Absolute: 281 cells/uL (ref 15–500)
Eosinophils Relative: 3.7 %
HCT: 38.7 % (ref 35.0–45.0)
Hemoglobin: 12.7 g/dL (ref 11.7–15.5)
Lymphs Abs: 2151 cells/uL (ref 850–3900)
MCH: 27.4 pg (ref 27.0–33.0)
MCHC: 32.8 g/dL (ref 32.0–36.0)
MCV: 83.6 fL (ref 80.0–100.0)
MPV: 11.6 fL (ref 7.5–12.5)
Monocytes Relative: 9.5 %
Neutro Abs: 4362 cells/uL (ref 1500–7800)
Neutrophils Relative %: 57.4 %
Platelets: 242 10*3/uL (ref 140–400)
RBC: 4.63 10*6/uL (ref 3.80–5.10)
RDW: 13.1 % (ref 11.0–15.0)
Total Lymphocyte: 28.3 %
WBC: 7.6 10*3/uL (ref 3.8–10.8)

## 2021-08-24 LAB — TSH: TSH: 0.07 mIU/L — ABNORMAL LOW (ref 0.40–4.50)

## 2021-09-01 IMAGING — MG MM DIGITAL SCREENING BILAT W/ TOMO AND CAD
8 series · 8 of 24 positions shown · non-contrast
Comparison: Previous exam(s).

CLINICAL DATA: Screening.

EXAM:
DIGITAL SCREENING BILATERAL MAMMOGRAM WITH TOMOSYNTHESIS AND CAD
TECHNIQUE: Bilateral screening digital craniocaudal and mediolateral oblique
mammograms were obtained. Bilateral screening digital breast
tomosynthesis was performed. The images were evaluated with
computer-aided detection.

[R CC synth-2D]
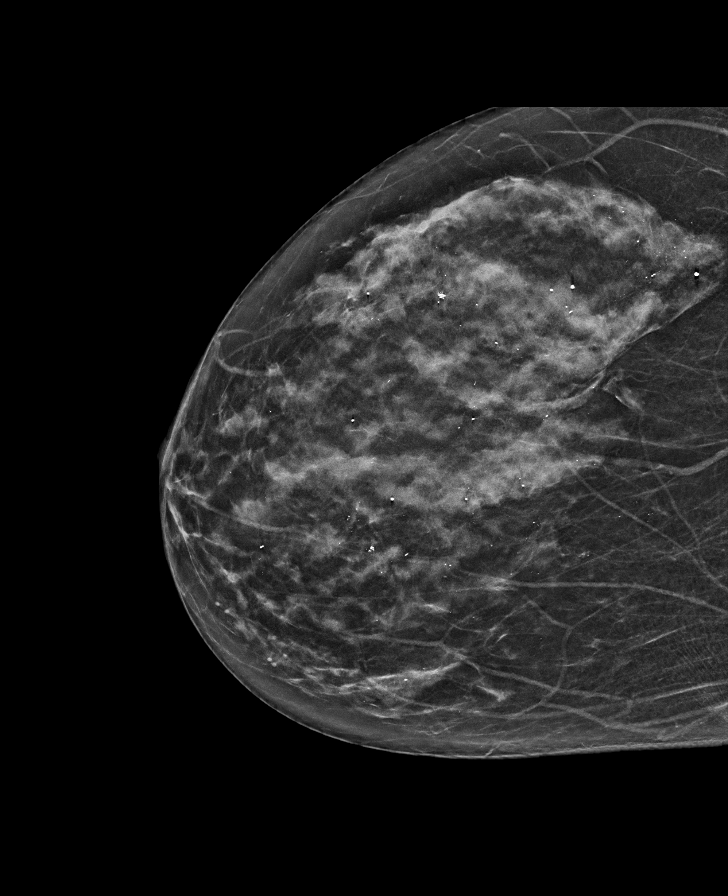

[L MLO synth-2D]
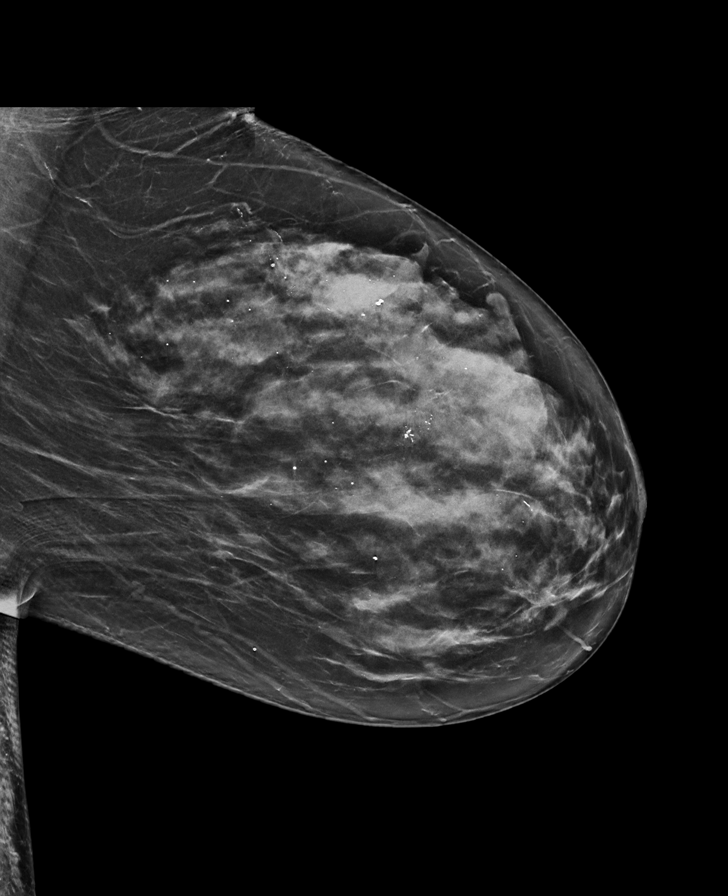

[L CC synth-2D]
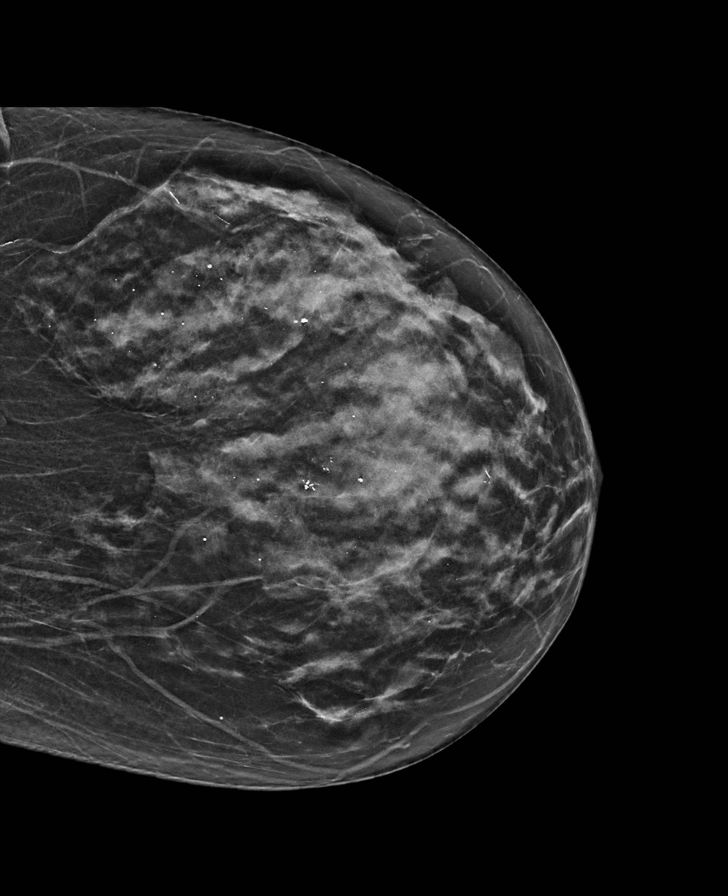

[R MLO synth-2D]
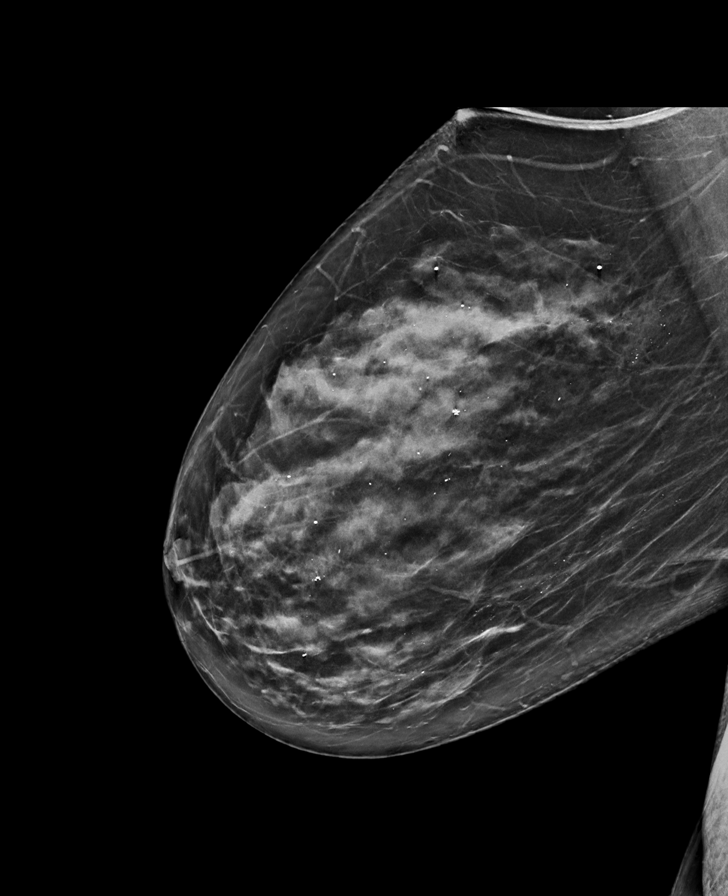

[R CC tomo · tomo slice 29/58.0]
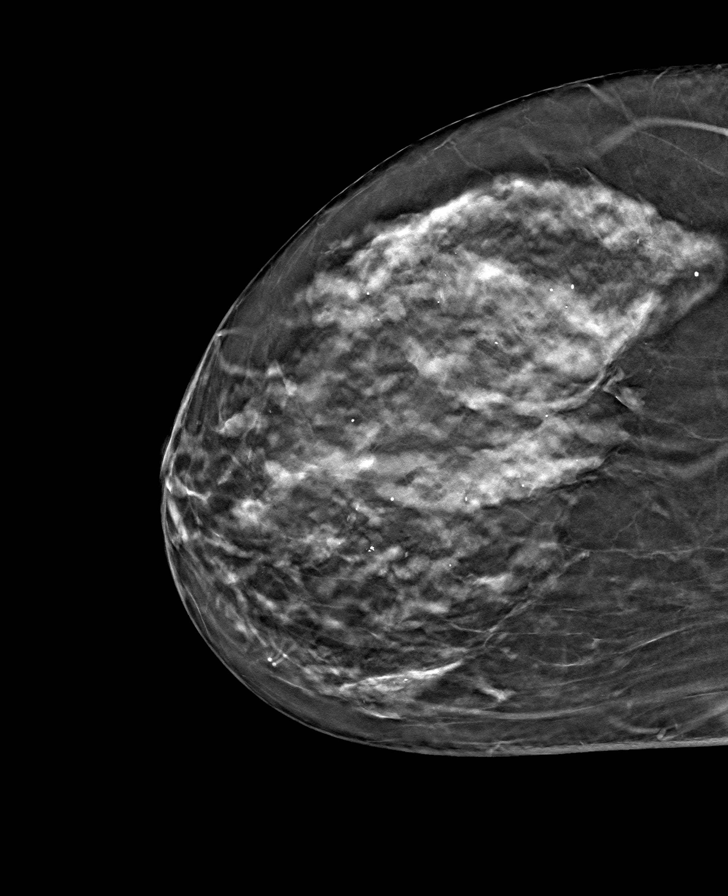

[L CC tomo · tomo slice 29/58.0]
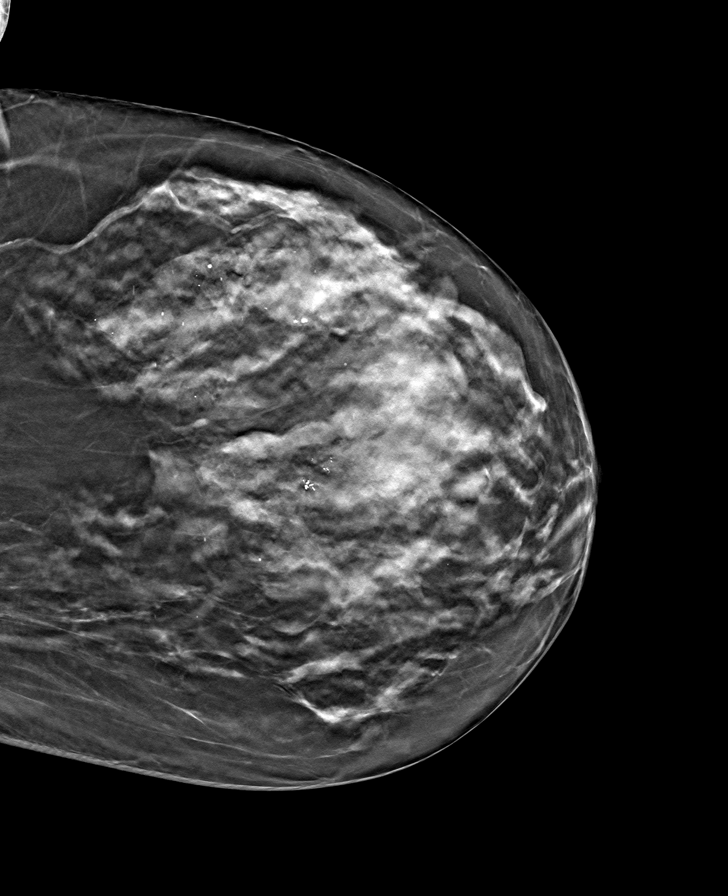

[R MLO tomo · tomo slice 37/74.0]
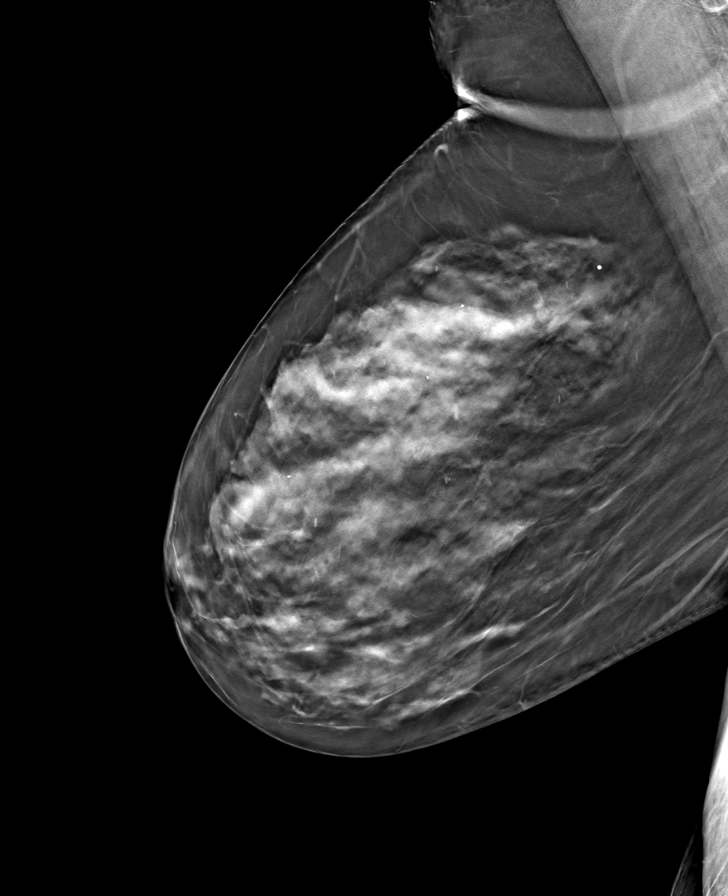

[L MLO tomo · tomo slice 37/72.0]
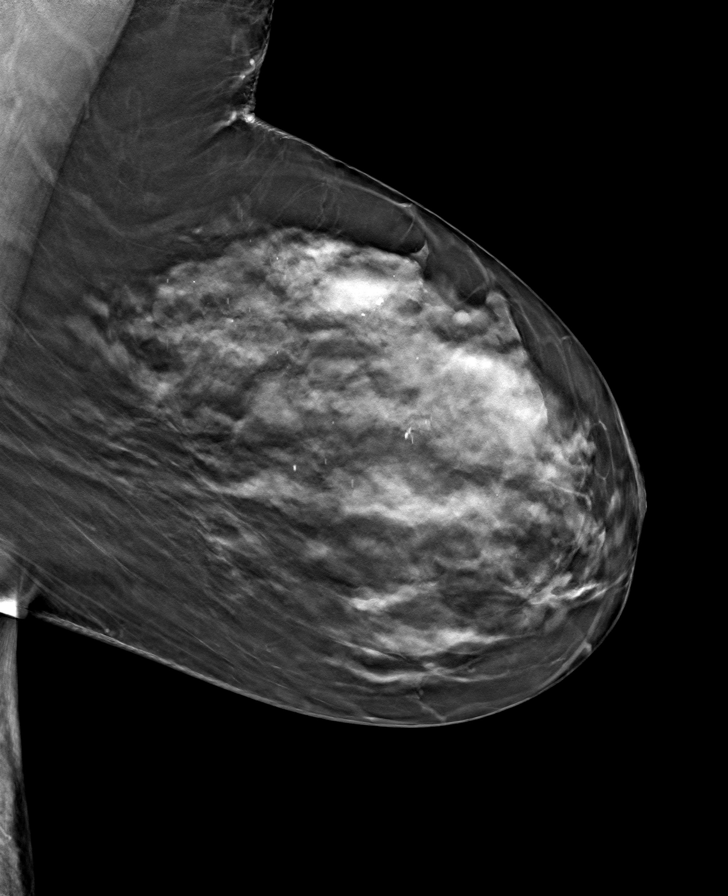

[8 of 24 positions shown; findings below may reference images not displayed]

ACR Breast Density Category c: The breast tissue is heterogeneously
dense, which may obscure small masses.
FINDINGS: There are no findings suspicious for malignancy.
IMPRESSION: No mammographic evidence of malignancy. A result letter of this
screening mammogram will be mailed directly to the patient.

RECOMMENDATION:
Screening mammogram in one year. (Code:Q3-W-BC3)

BI-RADS CATEGORY  1: Negative.

## 2021-09-12 ENCOUNTER — Ambulatory Visit (AMBULATORY_SURGERY_CENTER): Payer: 59 | Admitting: *Deleted

## 2021-09-12 ENCOUNTER — Encounter: Payer: Self-pay | Admitting: Gastroenterology

## 2021-09-12 ENCOUNTER — Other Ambulatory Visit: Payer: Self-pay

## 2021-09-12 VITALS — Ht 64.0 in | Wt 194.0 lb

## 2021-09-12 DIAGNOSIS — Z86010 Personal history of colonic polyps: Secondary | ICD-10-CM

## 2021-09-12 MED ORDER — PLENVU 140 G PO SOLR: 1.0000 | ORAL | 0 refills | Status: DC

## 2021-09-12 NOTE — Progress Notes (Signed)
No egg or soy allergy known to patient  No issues known to pt with past sedation with any surgeries or procedures Patient denies ever being told they had issues or difficulty with intubation  No FH of Malignant Hyperthermia Pt is not on diet pills Pt is not on  home 02  Pt is not on blood thinners  Pt denies issues with constipation  No A fib or A flutter  Pt is fully vaccinated  for Covid   Plenvu Coupon given to pt in PV today , Code to Pharmacy and  NO PA's for preps discussed with pt In PV today  Discussed with pt there will be an out-of-pocket cost for prep and that varies from $0 to 70 +  dollars - pt verbalized understanding   Due to the COVID-19 pandemic we are asking patients to follow certain guidelines in PV and the [place]   Pt aware of COVID protocols and [facility] guidelines  Pt verified name, DOB, address and insurance during PV today.  Pt mailed instruction packet of Emmi video, copy of consent form to read and not return, and instructions. Plenvu  coupon mailed in packet. PV completed over the phone.  Pt encouraged to call with questions or issues.  My Chart instructions to pt as well

## 2021-09-13 ENCOUNTER — Other Ambulatory Visit: Payer: Self-pay | Admitting: Nurse Practitioner

## 2021-09-13 DIAGNOSIS — J069 Acute upper respiratory infection, unspecified: Secondary | ICD-10-CM

## 2021-09-13 MED ORDER — DOXYCYCLINE HYCLATE 100 MG PO CAPS: 100.0000 mg | ORAL_CAPSULE | Freq: Two times a day (BID) | ORAL | 0 refills | Status: DC

## 2021-09-13 MED ORDER — DOXYCYCLINE HYCLATE 100 MG PO CAPS: 100.0000 mg | ORAL_CAPSULE | Freq: Two times a day (BID) | ORAL | 0 refills | Status: AC

## 2021-09-24 ENCOUNTER — Other Ambulatory Visit: Payer: Self-pay | Admitting: Internal Medicine

## 2021-09-24 DIAGNOSIS — G4700 Insomnia, unspecified: Secondary | ICD-10-CM

## 2021-09-26 ENCOUNTER — Other Ambulatory Visit: Payer: Self-pay

## 2021-09-26 ENCOUNTER — Ambulatory Visit (AMBULATORY_SURGERY_CENTER): Payer: 59 | Admitting: Gastroenterology

## 2021-09-26 ENCOUNTER — Encounter: Payer: Self-pay | Admitting: Gastroenterology

## 2021-09-26 VITALS — BP 127/76 | HR 68 | Temp 98.6°F | Resp 14 | Ht 64.0 in | Wt 187.0 lb

## 2021-09-26 DIAGNOSIS — D128 Benign neoplasm of rectum: Secondary | ICD-10-CM

## 2021-09-26 DIAGNOSIS — Z86010 Personal history of colonic polyps: Secondary | ICD-10-CM

## 2021-09-26 DIAGNOSIS — K621 Rectal polyp: Secondary | ICD-10-CM

## 2021-09-26 MED ORDER — SODIUM CHLORIDE 0.9 % IV SOLN: 500.0000 mL | Freq: Once | INTRAVENOUS | Status: DC

## 2021-09-26 NOTE — Progress Notes (Signed)
Pt's states no medical or surgical changes since previsit or office visit.  ° °Vitals CW °

## 2021-09-26 NOTE — Patient Instructions (Signed)
Handouts given on polyps and hemorrhoids.  YOU HAD AN ENDOSCOPIC PROCEDURE TODAY AT [facility]:   Refer to the procedure report that was given to you for any specific questions about what was found during the examination.  If the procedure report does not answer your questions, please call your gastroenterologist to clarify.  If you requested that your care partner not be given the details of your procedure findings, then the procedure report has been included in a sealed envelope for you to review at your convenience later.  YOU SHOULD EXPECT: Some feelings of bloating in the abdomen. Passage of more gas than usual.  Walking can help get rid of the air that was put into your GI tract during the procedure and reduce the bloating. If you had a lower endoscopy (such as a colonoscopy or flexible sigmoidoscopy) you may notice spotting of blood in your stool or on the toilet paper. If you underwent a bowel prep for your procedure, you may not have a normal bowel movement for a few days.  Please Note:  You might notice some irritation and congestion in your nose or some drainage.  This is from the oxygen used during your procedure.  There is no need for concern and it should clear up in a day or so.  SYMPTOMS TO REPORT IMMEDIATELY:  Following lower endoscopy (colonoscopy or flexible sigmoidoscopy):  Excessive amounts of blood in the stool  Significant tenderness or worsening of abdominal pains  Swelling of the abdomen that is new, acute  Fever of 100F or higher   For urgent or emergent issues, a gastroenterologist can be reached at any hour by calling [phone]. Do not use MyChart messaging for urgent concerns.    DIET:  We do recommend a small meal at first, but then you may proceed to your regular diet.  Drink plenty of fluids but you should avoid alcoholic beverages for 24 hours.  ACTIVITY:  You should plan to take it easy for the rest of today and you should NOT DRIVE or  use heavy machinery until tomorrow (because of the sedation medicines used during the test).    FOLLOW UP: Our staff will call the number listed on your records 48-72 hours following your procedure to check on you and address any questions or concerns that you may have regarding the information given to you following your procedure. If we do not reach you, we will leave a message.  We will attempt to reach you two times.  During this call, we will ask if you have developed any symptoms of COVID 19. If you develop any symptoms (ie: fever, flu-like symptoms, shortness of breath, cough etc.) before then, please call [phone].  If you test positive for Covid 19 in the 2 weeks post procedure, please call and report this information to [place].    If any biopsies were taken you will be contacted by phone or by letter within the next 1-3 weeks.  Please call us at [phone] if you have not heard about the biopsies in 3 weeks.    SIGNATURES/CONFIDENTIALITY: You and/or your care partner have signed paperwork which will be entered into your electronic medical record.  These signatures attest to the fact that that the information above on your After Visit Summary has been reviewed and is understood.  Full responsibility of the confidentiality of this discharge information lies with you and/or your care-partner.

## 2021-09-26 NOTE — Progress Notes (Signed)
To pacu, VSS. Report to [email or web address] 

## 2021-09-26 NOTE — Progress Notes (Signed)
Called to room to assist during endoscopic procedure.  Patient ID and intended procedure confirmed with present staff. Received instructions for my participation in the procedure from the performing physician.  

## 2021-09-26 NOTE — Progress Notes (Signed)
[facility] History and Physical   Primary Care Physician:  Unk Pinto, MD   Reason for Procedure:   History of colon polyps  Plan:    colonoscopy     HPI: Shannon Pope is a 60 y.o. female  here for colonoscopy surveillance, history of 1cm SSP removed in 10/2015. Patient denies any bowel symptoms at this time. No family history of colon cancer known. Otherwise feels well without any cardiopulmonary symptoms.    Past Medical History:  Diagnosis Date   Allergy    Anxiety    Arthritis    hip   Cataract    removed bilateral- + lens implants   Fatty liver disease, nonalcoholic 54/27/0623   Via U/S   GERD (gastroesophageal reflux disease)    Hyperlipidemia    Hypertension    Insomnia    Nephrolithiasis    Prediabetes    on metformin   Prolonged QT interval    Thyroid disease    Vitamin D deficiency     Past Surgical History:  Procedure Laterality Date   BREAST BIOPSY Left    CHOLECYSTECTOMY     COLONOSCOPY     KNEE ARTHROSCOPY Left    LITHOTRIPSY  2016   POLYPECTOMY     TUBAL LIGATION      Prior to Admission medications   Medication Sig Start Date End Date Taking? Authorizing Provider  ALPRAZolam (XANAX) 0.5 MG tablet TAKE 1/2 TO 1 TABLET 2 TO 3 TIMES DAILY ONLY IF NEEDED  FOR ANXIETY ATTACK, LIMIT  TO 5 DAYS A WEEK TO AVOID  ADDICTION AND DEMENTIA 08/17/21  Yes Unk Pinto, MD  aspirin 81 MG tablet Take 81 mg by mouth daily.   Yes [provider]  diltiazem (CARDIZEM CD) 240 MG 24 hr capsule TAKE 1 CAPSULE BY MOUTH  DAILY FOR BLOOD PRESSURE 01/03/21  Yes McClanahan, Danton Sewer, NP  hydrochlorothiazide (HYDRODIURIL) 25 MG tablet Take 1 tablet Daily for BP & Fluid Retention /Ankle Swelling 05/18/21  Yes Magda Bernheim, NP  metFORMIN (GLUCOPHAGE XR) 500 MG 24 hr tablet Start with 1 tab with largest meal of the day and slowly increase as tolerated to 4 times a day with meals for blood sugar control 05/23/21  Yes Magda Bernheim, NP  quinapril  (ACCUPRIL) 40 MG tablet Take  1 tablet  Daily  for BP 03/12/21  Yes Unk Pinto, MD  SYNTHROID 200 MCG tablet TAKE ONE TABLET DAILY FOR THYROID 05/19/21  Yes Magda Bernheim, NP  cholecalciferol (VITAMIN D) 1000 UNITS tablet Take 10,000 Units by mouth daily.     [provider]  clotrimazole-betamethasone (LOTRISONE) cream Apply 1 application topically 2 (two) times daily. 07/20/19   Vladimir Crofts, PA-C  Multiple Vitamin (MULTIVITAMIN WITH MINERALS) TABS tablet Take 1 tablet by mouth daily.    [provider]  Omega-3 Fatty Acids (FISH OIL PO) Take 1 capsule by mouth daily.    [provider]  zinc gluconate 50 MG tablet Take 50 mg by mouth daily.    [provider]    Current Outpatient Medications  Medication Sig Dispense Refill   ALPRAZolam (XANAX) 0.5 MG tablet TAKE 1/2 TO 1 TABLET 2 TO 3 TIMES DAILY ONLY IF NEEDED  FOR ANXIETY ATTACK, LIMIT  TO 5 DAYS A WEEK TO AVOID  ADDICTION AND DEMENTIA 90 tablet 0   aspirin 81 MG tablet Take 81 mg by mouth daily.     diltiazem (CARDIZEM CD) 240 MG 24 hr capsule TAKE  1 CAPSULE BY MOUTH  DAILY FOR BLOOD PRESSURE 90 capsule 3   hydrochlorothiazide (HYDRODIURIL) 25 MG tablet Take 1 tablet Daily for BP & Fluid Retention /Ankle Swelling 90 tablet 3   metFORMIN (GLUCOPHAGE XR) 500 MG 24 hr tablet Start with 1 tab with largest meal of the day and slowly increase as tolerated to 4 times a day with meals for blood sugar control 120 tablet 11   quinapril (ACCUPRIL) 40 MG tablet Take  1 tablet  Daily  for BP 90 tablet 3   SYNTHROID 200 MCG tablet TAKE ONE TABLET DAILY FOR THYROID 90 tablet 2   cholecalciferol (VITAMIN D) 1000 UNITS tablet Take 10,000 Units by mouth daily.      clotrimazole-betamethasone (LOTRISONE) cream Apply 1 application topically 2 (two) times daily. 45 g 2   Multiple Vitamin (MULTIVITAMIN WITH MINERALS) TABS tablet Take 1 tablet by mouth daily.     Omega-3 Fatty Acids (FISH OIL PO) Take 1 capsule by mouth  daily.     zinc gluconate 50 MG tablet Take 50 mg by mouth daily.     Current Facility-Administered Medications  Medication Dose Route Frequency Provider Last Rate Last Admin   0.9 %  sodium chloride infusion  500 mL Intravenous Once Herman Fiero, Carlota Raspberry, MD        Allergies as of 09/26/2021 - Review Complete 09/26/2021  Allergen Reaction Noted   Atenolol  11/03/2013   Codeine Nausea Only 05/04/2010   Klonopin [clonazepam] Rash 11/03/2013   Penicillins Rash 03/31/2013    Family History  Problem Relation Age of Onset   Heart Problems Mother 52       CABG   Hypertension Mother    Peripheral Artery Disease Sister    Colon cancer Neg Hx    Colon polyps Neg Hx    Esophageal cancer Neg Hx    Stomach cancer Neg Hx    Rectal cancer Neg Hx     Social History   Socioeconomic History   Marital status: Married    Spouse name: Not on file   Number of children: Not on file   Years of education: Not on file   Highest education level: Not on file  Occupational History   Not on file  Tobacco Use   Smoking status: Former    Packs/day: 1.00    Years: 10.00    Pack years: 10.00    Types: Cigarettes    Quit date: 11/03/1993    Years since quitting: 27.9   Smokeless tobacco: Never   Tobacco comments:    quit 15years ago  Vaping Use   Vaping Use: Never used  Substance and Sexual Activity   Alcohol use: Yes    Alcohol/week: 2.0 standard drinks    Types: 1 Glasses of wine, 1 Standard drinks or equivalent per week    Comment: occasionally   Drug use: No   Sexual activity: Yes    Birth control/protection: Post-menopausal  Other Topics Concern   Not on file  Social History Narrative   Not on file   Social Determinants of Health   Financial Resource Strain: Not on file  Food Insecurity: Not on file  Transportation Needs: Not on file  Physical Activity: Not on file  Stress: Not on file  Social Connections: Not on file  Intimate Partner Violence: Not on file    Review of  Systems: All other review of systems negative except as mentioned in the HPI.  Physical Exam: Vital signs BP 138/62  Pulse 88   Temp 98.6 F (37 C) (Temporal)   Resp 18   Ht 5\' 4"  (1.626 m)   Wt 187 lb (84.8 kg)   LMP 09/01/2013   SpO2 100%   BMI 32.10 kg/m   General:   Alert,  Well-developed, pleasant and cooperative in NAD Lungs:  Clear throughout to auscultation.   Heart:  Regular rate and rhythm Abdomen:  Soft, nontender and nondistended.   Neuro/Psych:  Alert and cooperative. Normal mood and affect. A and O x 3  Jolly Mango, MD [facility]

## 2021-09-26 NOTE — Op Note (Signed)
[facility] Patient Name: Shannon Pope Procedure Date: 09/26/2021 9:01 AM MRN: [id number] Endoscopist: Remo Lipps P. Havery Moros , MD Age: 60 Referring MD:  Date of Birth: Feb 25, 1961 Gender: Female Account #: [id number] Procedure:                Colonoscopy Indications:              High risk colon cancer surveillance: Personal                            history of colonic polyps - advanced sessile                            serrated polyp removed 10/2015 Medicines:                Monitored Anesthesia Care Procedure:                Pre-Anesthesia Assessment:                           - Prior to the procedure, a History and Physical                            was performed, and patient medications and                            allergies were reviewed. The patient's tolerance of                            previous anesthesia was also reviewed. The risks                            and benefits of the procedure and the sedation                            options and risks were discussed with the patient.                            All questions were answered, and informed consent                            was obtained. Prior Anticoagulants: The patient has                            taken no previous anticoagulant or antiplatelet                            agents. ASA Grade Assessment: II - A patient with                            mild systemic disease. After reviewing the risks                            and benefits, the patient was deemed in  satisfactory condition to undergo the procedure.                           After obtaining informed consent, the colonoscope                            was passed under direct vision. Throughout the                            procedure, the patient's blood pressure, pulse, and                            oxygen saturations were monitored continuously. The                            CF [id number] #[id number] was  introduced through the anus                            and advanced to the the cecum, identified by                            appendiceal orifice and ileocecal valve. The                            colonoscopy was performed without difficulty. The                            patient tolerated the procedure well. The quality                            of the bowel preparation was good. The ileocecal                            valve, appendiceal orifice, and rectum were                            photographed. Scope In: 9:19:24 AM Scope Out: 9:40:58 AM Scope Withdrawal Time: 0 hours 16 minutes 1 second  Total Procedure Duration: 0 hours 21 minutes 34 seconds  Findings:                 The perianal and digital rectal examinations were                            normal.                           Four sessile polyps were found in the rectum. The                            polyps were 3 mm in size. These polyps were removed                            with a cold snare. Resection and retrieval were  complete.                           Internal hemorrhoids were found during                            retroflexion. The hemorrhoids were small.                           The exam was otherwise without abnormality. Complications:            No immediate complications. Estimated blood loss:                            Minimal. Estimated Blood Loss:     Estimated blood loss was minimal. Impression:               - Four 3 mm polyps in the rectum, removed with a                            cold snare. Resected and retrieved.                           - Internal hemorrhoids.                           - The examination was otherwise normal. Recommendation:           - Patient has a contact number available for                            emergencies. The signs and symptoms of potential                            delayed complications were discussed with the                             patient. Return to normal activities tomorrow.                            Written discharge instructions were provided to the                            patient.                           - Resume previous diet.                           - Continue present medications.                           - Await pathology results. Remo Lipps P. [name], MD 09/26/2021 9:46:34 AM This report has been signed electronically.

## 2021-09-28 ENCOUNTER — Telehealth: Payer: Self-pay | Admitting: *Deleted

## 2021-09-28 NOTE — Telephone Encounter (Signed)
  Follow up Call-  Call back number 09/26/2021  Post procedure Call Back phone  # [phone]  Permission to leave phone message Yes  Some recent data might be hidden     Patient questions:  Do you have a fever, pain , or abdominal swelling? No. Pain Score  0 *  Have you tolerated food without any problems? Yes.    Have you been able to return to your normal activities? Yes.    Do you have any questions about your discharge instructions: Diet   No. Medications  No. Follow up visit  No.  Do you have questions or concerns about your Care? No.  Actions: * If pain score is 4 or above: No action needed, pain <4.  Have you developed a fever since your procedure? no  2.   Have you had an respiratory symptoms (SOB or cough) since your procedure? no  3.   Have you tested positive for COVID 19 since your procedure no  4.   Have you had any family members/close contacts diagnosed with the COVID 19 since your procedure?  no   If yes to any of these questions please route to Joylene John, RN and Joella Prince, RN

## 2021-10-17 ENCOUNTER — Other Ambulatory Visit: Payer: Self-pay | Admitting: Internal Medicine

## 2021-10-17 DIAGNOSIS — G4700 Insomnia, unspecified: Secondary | ICD-10-CM

## 2021-11-10 ENCOUNTER — Other Ambulatory Visit: Payer: Self-pay | Admitting: Adult Health Nurse Practitioner

## 2021-11-10 DIAGNOSIS — I10 Essential (primary) hypertension: Secondary | ICD-10-CM

## 2021-11-28 NOTE — Progress Notes (Signed)
3 MONTH FOLLOW UP  Assessment and Plan:  Shannon Pope was seen today for follow-up.  Diagnoses and all orders for this visit:  Type 2 diabetes mellitus with hyperlipidemia ([place])  Continue metformin, Diet and exercise.  Abdominal aortic atherosclerosis ([facility])  Control blood pressure, cholesterol and weight  Type 2 diabetes mellitus treated without insulin ([facility]) Continue medications: Continue diet and exercise.  Perform daily foot/skin check, notify office of any concerning changes.  Check A1C  -     Hemoglobin A1c   Hyperlipidemia associated with Type 2 DM([facility]) Continue diet and exercise     - CBC with Differential/Platelet -     COMPLETE METABOLIC PANEL WITH GFR -     Lipid panel - Continue diet and exercise  Essential hypertension -     CBC with Differential/Platelet -     Microalbumin / creatinine urine ratio  - continue medications, DASH diet, exercise and monitor at home. Call if greater than 130/80.    Class 2 drug-induced obesity with serious comorbidity and body mass index (BMI) of 34- 34.9  Pt has lost 15 pounds in last 2 months  Continue diet and exercise  Hypothyroidism, unspecified type -     TSH - Continue current dose of Synthroid(requires brand name due to allergic reaction with generic), will adjust pending lab results Please take your thyroid medication greater than 30 min before breakfast, separated by at least 4 hours  from antacids, calcium, iron, and multivitamins.    Osteoarthritis Begin Meloxicam as needed Continue exercise, diet and weight loss  Continue diet and meds as discussed. Further disposition pending results of labs. Discussed med's effects and SE's.   Over 30 minutes of face to face interview, exam, counseling, chart review, and critical decision making was performed.  Future Appointments  Date Time Provider [facility]  11/29/2021  3:30 PM Magda Bernheim, NP [facility] None  05/18/2022 10:00 AM Magda Bernheim, NP [facility] None      HPI 61 y.o. female  presents for a complete physical.  She has had elevated blood pressure since 2000. Her blood pressure has been controlled at home, today their BP is BP: 118/72   BP Readings from Last 3 Encounters:  11/29/21 118/72  09/26/21 127/76  08/23/21 122/70    BMI is Body mass index is 31.27 kg/m., she is working on diet and exercise. Could not tolerate phentermine.  Wt Readings from Last 3 Encounters:  11/29/21 182 lb 3.2 oz (82.6 kg)  09/26/21 187 lb (84.8 kg)  09/12/21 194 lb (88 kg)   She does workout, walks 5 miles a day. She denies chest pain, shortness of breath, dizziness.   She is not on cholesterol medication and denies myalgias. Her cholesterol is at goal. Pt has been eating less fat and getting more exercise. The cholesterol last visit was:   Lab Results  Component Value Date   CHOL 157 08/23/2021   HDL 30 (L) 08/23/2021   LDLCALC 91 08/23/2021   TRIG 275 (H) 08/23/2021   CHOLHDL 5.2 (H) 08/23/2021    She has been working on diet and exercise for prediabetes, and denies paresthesia of the feet, polydipsia and polyuria. Currently taking Metformin 1 tab 4 times a day. Last A1C in the office was:  Lab Results  Component Value Date   HGBA1C 5.8 (H) 08/23/2021   Lab Results  Component Value Date   GFRNONAA 101 05/18/2021   Patient is on Vitamin D supplement,  5000 every day.  She is on thyroid medication. Her medication was changed last visit, she is on 200 mcg daily, Wednesday taking 1/2 tab. Had an allergic reactions to the generic synthroid, her insurance is not covering the brand name. Will send directly to synthroid.  Lab Results  Component Value Date   TSH 0.07 (L) 08/23/2021  .  Has a history of kidney stones, follows with Dr. Matilde Sprang.  Very rare trouble sleeping, takes xanax PRN if this happens.  Sister and mother passed in 10-07-2014, stroke and sudden death due heart valve disease.  Has had 3 sisters with carotid artery disease, have  had stroke, 70's, upper 60's, smokers, she denies symptoms of dizziness, vision changes- explained that we will monitor and control risk factors but no need for further testing at this time. . Coronary Calcium Score 10/2019 was 0.  Current Medications:  Current Outpatient Medications on File Prior to Visit  Medication Sig   ALPRAZolam (XANAX) 0.5 MG tablet TAKE 1/2 TO 1 TABLET 2 TO 3 TIMES DAILY ONLY IF NEEDED  FOR ANXIETY ATTACK, LIMIT  TO 5 DAYS A WEEK TO AVOID  ADDICTION AND DEMENTIA   aspirin 81 MG tablet Take 81 mg by mouth daily.   cholecalciferol (VITAMIN D) 1000 UNITS tablet Take 10,000 Units by mouth daily.    clotrimazole-betamethasone (LOTRISONE) cream Apply 1 application topically 2 (two) times daily.   diltiazem (CARDIZEM CD) 240 MG 24 hr capsule Take  1 capsule  Daily  for BP                                                            /                                       TAKE 1 CAPSULE BY MOUTH   hydrochlorothiazide (HYDRODIURIL) 25 MG tablet Take 1 tablet Daily for BP & Fluid Retention /Ankle Swelling   metFORMIN (GLUCOPHAGE XR) 500 MG 24 hr tablet Start with 1 tab with largest meal of the day and slowly increase as tolerated to 4 times a day with meals for blood sugar control   Multiple Vitamin (MULTIVITAMIN WITH MINERALS) TABS tablet Take 1 tablet by mouth daily.   Omega-3 Fatty Acids (FISH OIL PO) Take 1 capsule by mouth daily.   quinapril (ACCUPRIL) 40 MG tablet Take  1 tablet  Daily  for BP   SYNTHROID 200 MCG tablet TAKE ONE TABLET DAILY FOR THYROID   zinc gluconate 50 MG tablet Take 50 mg by mouth daily.   No current facility-administered medications on file prior to visit.    Health Maintenance:   Immunization History  Administered Date(s) Administered   Pneumococcal-Unspecified 04/10/2010   Td 11/03/2008   Tdap 12/20/2017   Tetanus: 2019 Pneumovax: 2011 Prevnar 77: age 27 Flu vaccine: will get in 08-Oct-2023 this year Zostavax: N/A  Pap: 06/2019 normal needs repeat  2023 MGM: 3D mammogram 08/20/2020 benign to follow up 1 year, pt calling to schedule DEXA: N/A Colonoscopy:12/ 2016 Dr. Deatra Ina Dr. Havery Moros removed 1 polyp , pt to schedule next colonoscopy  EGD: N/A CXR: DUE CT AB 2014 [place] AB 2014 fatty liver Catarracts removed bilaterally 2022  Medical History:  Past Medical  History:  Diagnosis Date   Allergy    Anxiety    Arthritis    hip   Cataract    removed bilateral- + lens implants   Fatty liver disease, nonalcoholic 07/37/1062   Via U/S   GERD (gastroesophageal reflux disease)    Hyperlipidemia    Hypertension    Insomnia    Nephrolithiasis    Prediabetes    on metformin   Prolonged QT interval    Thyroid disease    Vitamin D deficiency    Allergies Allergies  Allergen Reactions   Atenolol     Beta blockers. Pt doesn't remember there being a problem   Codeine Nausea Only   Klonopin [Clonazepam] Rash   Penicillins Rash    SURGICAL HISTORY She  has a past surgical history that includes Cholecystectomy; Tubal ligation; Lithotripsy (2016); Breast biopsy (Left); Colonoscopy; Polypectomy; and Knee arthroscopy (Left). FAMILY HISTORY Her family history includes Heart Problems (age of onset: 24) in her mother; Hypertension in her mother; Peripheral Artery Disease in her sister. SOCIAL HISTORY She  reports that she quit smoking about 28 years ago. Her smoking use included cigarettes. She has a 10.00 pack-year smoking history. She has never used smokeless tobacco. She reports current alcohol use of about 2.0 standard drinks per week. She reports that she does not use drugs.  Review of Systems  Constitutional: Negative.  Negative for chills, fever and weight loss.  HENT: Negative.  Negative for congestion and hearing loss.   Eyes: Negative.  Negative for blurred vision and double vision.  Respiratory:  Negative for cough, hemoptysis, shortness of breath and wheezing.   Cardiovascular:  Negative for chest pain, palpitations,  orthopnea and leg swelling.  Gastrointestinal:  Negative for abdominal pain, constipation, diarrhea, heartburn, nausea and vomiting.  Genitourinary:  Negative for dysuria, frequency and urgency.  Musculoskeletal:  Negative for back pain, falls, joint pain, myalgias and neck pain.  Skin: Negative.  Negative for rash.  Neurological:  Negative for dizziness, tingling, tremors, loss of consciousness, weakness and headaches.  Endo/Heme/Allergies:  Does not bruise/bleed easily.  Psychiatric/Behavioral:  Negative for depression, memory loss and suicidal ideas. The patient has insomnia (1/2 xanax 0.5 mg prn).     Physical Exam: Estimated body mass index is 31.27 kg/m as calculated from the following:   Height as of 09/26/21: 5\' 4"  (1.626 m).   Weight as of this encounter: 182 lb 3.2 oz (82.6 kg). BP 118/72    Pulse 79    Temp 97.9 F (36.6 C)    Wt 182 lb 3.2 oz (82.6 kg)    LMP 09/01/2013    SpO2 98%    BMI 31.27 kg/m  General Appearance: Well nourished, in no apparent distress. Eyes: PERRLA, EOMs, conjunctiva no swelling or erythema, normal fundi and vessels. Sinuses: No Frontal/maxillary tenderness ENT/Mouth: Ext aud canals clear, normal light reflex with TMs without erythema, bulging.  Good dentition. No erythema, swelling, or exudate on post pharynx. Tonsils not swollen or erythematous. Hearing normal.  Neck: Supple, thyroid normal. No bruits Respiratory: Respiratory effort normal, BS equal bilaterally without rales, rhonchi, wheezing or stridor. Cardio: RRR without murmurs, rubs or gallops. Brisk peripheral pulses without edema.  Chest: symmetric, with normal excursions and percussion. Abdomen: Soft, +BS. Non tender, no guarding, rebound, hernias, masses, or organomegaly.  Lymphatics: Non tender without lymphadenopathy.  Musculoskeletal: Full ROM all peripheral extremities,5/5 strength, and normal gait. Skin: Warm, dry without rashes, lesions, ecchymosis.  Neuro: Cranial nerves intact,  reflexes equal bilaterally.  Normal muscle tone, no cerebellar symptoms. Sensation intact.  Psych: Awake and oriented X 3, normal affect, Insight and Judgment appropriate.     Magda Bernheim ANP-C  [facility]  11/29/2021

## 2021-11-29 ENCOUNTER — Other Ambulatory Visit: Payer: Self-pay

## 2021-11-29 ENCOUNTER — Encounter: Payer: Self-pay | Admitting: Nurse Practitioner

## 2021-11-29 ENCOUNTER — Ambulatory Visit (INDEPENDENT_AMBULATORY_CARE_PROVIDER_SITE_OTHER): Payer: 59 | Admitting: Nurse Practitioner

## 2021-11-29 VITALS — BP 118/72 | HR 79 | Temp 97.9°F | Wt 182.2 lb

## 2021-11-29 DIAGNOSIS — I700 Atherosclerosis of aorta: Secondary | ICD-10-CM | POA: Diagnosis not present

## 2021-11-29 DIAGNOSIS — E1169 Type 2 diabetes mellitus with other specified complication: Secondary | ICD-10-CM | POA: Diagnosis not present

## 2021-11-29 DIAGNOSIS — E119 Type 2 diabetes mellitus without complications: Secondary | ICD-10-CM | POA: Diagnosis not present

## 2021-11-29 DIAGNOSIS — M158 Other polyosteoarthritis: Secondary | ICD-10-CM

## 2021-11-29 DIAGNOSIS — E785 Hyperlipidemia, unspecified: Secondary | ICD-10-CM

## 2021-11-29 DIAGNOSIS — I10 Essential (primary) hypertension: Secondary | ICD-10-CM

## 2021-11-29 DIAGNOSIS — E039 Hypothyroidism, unspecified: Secondary | ICD-10-CM

## 2021-11-29 MED ORDER — MELOXICAM 7.5 MG PO TABS: 7.5000 mg | ORAL_TABLET | Freq: Every day | ORAL | 2 refills | Status: AC

## 2021-11-29 NOTE — Patient Instructions (Signed)
Meloxicam Tablets What is this medication? MELOXICAM (mel OX i cam) treats mild to moderate pain, inflammation, or arthritis. It works by decreasing inflammation. It belongs to a group of medications called NSAIDs. This medicine may be used for other purposes; ask your health care provider or pharmacist if you have questions. COMMON BRAND NAME(S): Mobic What should I tell my care team before I take this medication? They need to know if you have any of these conditions: Asthma (lung or breathing disease) Bleeding disorder Coronary artery bypass graft (CABG) within the past 2 weeks Dehydration Heart attack Heart disease Heart failure High blood pressure If you often drink alcohol Kidney disease Liver disease Smoke tobacco cigarettes Stomach bleeding Stomach ulcers, other stomach or intestine problems Take medications that treat or prevent blood clots Taking other steroids like dexamethasone or prednisone An unusual or allergic reaction to meloxicam, other medications, foods, dyes, or preservatives Pregnant or trying to get pregnant Breast-feeding How should I use this medication? Take this medication by mouth. Take it as directed on the prescription label at the same time every day. You can take it with or without food. If it upsets your stomach, take it with food. Do not use it more often than directed. There may be unused or extra doses in the bottle after you finish your treatment. Talk to your care team if you have questions about your dose. A special MedGuide will be given to you by the pharmacist with each prescription and refill. Be sure to read this information carefully each time. Talk to your care team about the use of this medication in children. Special care may be needed. Patients over 52 years of age may have a stronger reaction and need a smaller dose. Overdosage: If you think you have taken too much of this medicine contact a poison control center or emergency room at  once. NOTE: This medicine is only for you. Do not share this medicine with others. What if I miss a dose? If you miss a dose, take it as soon as you can. If it is almost time for your next dose, take only that dose. Do not take double or extra doses. What may interact with this medication? Do not take this medication with any of the following: Cidofovir Ketorolac This medication may also interact with the following: Aspirin and aspirin-like medications Certain medications for blood pressure, heart disease, irregular heart beat Certain medications for depression, anxiety, or psychotic disturbances Certain medications that treat or prevent blood clots like warfarin, enoxaparin, dalteparin, apixaban, dabigatran, rivaroxaban Cyclosporine Diuretics Fluconazole Lithium Methotrexate Other NSAIDs, medications for pain and inflammation, like ibuprofen and naproxen Pemetrexed This list may not describe all possible interactions. Give your health care provider a list of all the medicines, herbs, non-prescription drugs, or dietary supplements you use. Also tell them if you smoke, drink alcohol, or use illegal drugs. Some items may interact with your medicine. What should I watch for while using this medication? Visit your care team for regular checks on your progress. Tell your care team if your symptoms do not start to get better or if they get worse. Do not take other medications that contain aspirin, ibuprofen, or naproxen with this medication. Side effects such as stomach upset, nausea, or ulcers may be more likely to occur. Many non-prescription medications contain aspirin, ibuprofen, or naproxen. Always read labels carefully. This medication can cause serious ulcers and bleeding in the stomach. It can happen with no warning. Smoking, drinking alcohol, older age, and  poor health can also increase risks. Call your care team right away if you have stomach pain or blood in your vomit or stool. This  medication does not prevent a heart attack or stroke. This medication may increase the chance of a heart attack or stroke. The chance may increase the longer you use this medication or if you have heart disease. If you take aspirin to prevent a heart attack or stroke, talk to your care team about using this medication. Alcohol may interfere with the effect of this medication. Avoid alcoholic drinks. This medication may cause serious skin reactions. They can happen weeks to months after starting the medication. Contact your care team right away if you notice fevers or flu-like symptoms with a rash. The rash may be red or purple and then turn into blisters or peeling of the skin. Or, you might notice a red rash with swelling of the face, lips or lymph nodes in your neck or under your arms. Talk to your care team if you are pregnant before taking this medication. Taking this medication between weeks 20 and 30 of pregnancy may harm your unborn baby. Your care team will monitor you closely if you need to take it. After 30 weeks of pregnancy, do not take this medication. You may get drowsy or dizzy. Do not drive, use machinery, or do anything that needs mental alertness until you know how this medication affects you. Do not stand up or sit up quickly, especially if you are an older patient. This reduces the risk of dizzy or fainting spells. Be careful brushing or flossing your teeth or using a toothpick because you may get an infection or bleed more easily. If you have any dental work done, tell your dentist you are receiving this medication. This medication may make it more difficult to get pregnant. Talk to your care team if you are concerned about your fertility. What side effects may I notice from receiving this medication? Side effects that you should report to your care team as soon as possible: Allergic reactions--skin rash, itching, hives, swelling of the face, lips, tongue, or throat Bleeding--bloody or  black, tar-like stools, vomiting blood or brown material that looks like coffee grounds, red or dark brown urine, small red or purple spots on skin, unusual bruising or bleeding Heart attack--pain or tightness in the chest, shoulders, arms, or jaw, nausea, shortness of breath, cold or clammy skin, feeling faint or lightheaded Heart failure--shortness of breath, swelling of ankles, feet, or hands, sudden weight gain, unusual weakness or fatigue Increase in blood pressure Kidney injury--decrease in the amount of urine, swelling of the ankles, hands, or feet Liver injury--right upper belly pain, loss of appetite, nausea, light-colored stool, dark yellow or brown urine, yellowing skin or eyes, unusual weakness or fatigue Rash, fever, and swollen lymph nodes Redness, blistering, peeling, or loosening of the skin, including inside the mouth Stroke--sudden numbness or weakness of the face, arm, or leg, trouble speaking, confusion, trouble walking, loss of balance or coordination, dizziness, severe headache, change in vision Side effects that usually do not require medical attention (report to your care team if they continue or are bothersome): Diarrhea Nausea Upset stomach This list may not describe all possible side effects. Call your doctor for medical advice about side effects. You may report side effects to FDA at 1-800-FDA-1088. Where should I keep my medication? Keep out of the reach of children and pets. Store at room temperature between 20 and 25 degrees C (68 and  77 degrees F). Protect from moisture. Keep the container tightly closed. Get rid of any unused medication after the expiration date. To get rid of medications that are no longer needed or have expired: Take the medication to a medication take-back program. Check with your pharmacy or law enforcement to find a location. If you cannot return the medication, check the label or package insert to see if the medication should be thrown out  in the garbage or flushed down the toilet. If you are not sure, ask your care team. If it is safe to put it in the trash, empty the medication out of the container. Mix the medication with cat litter, dirt, coffee grounds, or other unwanted substance. Seal the mixture in a bag or container. Put it in the trash. NOTE: This sheet is a summary. It may not cover all possible information. If you have questions about this medicine, talk to your doctor, pharmacist, or health care provider.  2022 [employer]vier/Gold Standard (2021-02-01 00:00:00)

## 2021-11-30 LAB — CBC WITH DIFFERENTIAL/PLATELET
Absolute Monocytes: 684 cells/uL (ref 200–950)
Basophils Absolute: 72 cells/uL (ref 0–200)
Basophils Relative: 0.8 %
Eosinophils Absolute: 315 cells/uL (ref 15–500)
Eosinophils Relative: 3.5 %
HCT: 42 % (ref 35.0–45.0)
Hemoglobin: 14 g/dL (ref 11.7–15.5)
Lymphs Abs: 2502 cells/uL (ref 850–3900)
MCH: 28.2 pg (ref 27.0–33.0)
MCHC: 33.3 g/dL (ref 32.0–36.0)
MCV: 84.5 fL (ref 80.0–100.0)
MPV: 10.9 fL (ref 7.5–12.5)
Monocytes Relative: 7.6 %
Neutro Abs: 5427 cells/uL (ref 1500–7800)
Neutrophils Relative %: 60.3 %
Platelets: 250 10*3/uL (ref 140–400)
RBC: 4.97 10*6/uL (ref 3.80–5.10)
RDW: 14.3 % (ref 11.0–15.0)
Total Lymphocyte: 27.8 %
WBC: 9 10*3/uL (ref 3.8–10.8)

## 2021-11-30 LAB — COMPLETE METABOLIC PANEL WITH GFR
AG Ratio: 1.6 (calc) (ref 1.0–2.5)
ALT: 27 U/L (ref 6–29)
AST: 36 U/L — ABNORMAL HIGH (ref 10–35)
Albumin: 4.6 g/dL (ref 3.6–5.1)
Alkaline phosphatase (APISO): 140 U/L (ref 37–153)
BUN: 14 mg/dL (ref 7–25)
CO2: 30 mmol/L (ref 20–32)
Calcium: 10.3 mg/dL (ref 8.6–10.4)
Chloride: 98 mmol/L (ref 98–110)
Creat: 0.5 mg/dL (ref 0.50–1.05)
Globulin: 2.8 g/dL (calc) (ref 1.9–3.7)
Glucose, Bld: 88 mg/dL (ref 65–99)
Potassium: 4.2 mmol/L (ref 3.5–5.3)
Sodium: 136 mmol/L (ref 135–146)
Total Bilirubin: 0.5 mg/dL (ref 0.2–1.2)
Total Protein: 7.4 g/dL (ref 6.1–8.1)
eGFR: 106 mL/min/{1.73_m2} (ref >=60)

## 2021-11-30 LAB — HEMOGLOBIN A1C
Hgb A1c MFr Bld: 5.5 % of total Hgb (ref <5.7)
Mean Plasma Glucose: 111 mg/dL
eAG (mmol/L): 6.2 mmol/L

## 2021-11-30 LAB — LIPID PANEL
Cholesterol: 188 mg/dL (ref <200)
HDL: 43 mg/dL — ABNORMAL LOW (ref >=50)
LDL Cholesterol (Calc): 112 mg/dL (calc) — ABNORMAL HIGH
Non-HDL Cholesterol (Calc): 145 mg/dL (calc) — ABNORMAL HIGH (ref <130)
Total CHOL/HDL Ratio: 4.4 (calc) (ref <5.0)
Triglycerides: 209 mg/dL — ABNORMAL HIGH (ref <150)

## 2021-11-30 LAB — TSH: TSH: 0.01 mIU/L — ABNORMAL LOW (ref 0.40–4.50)

## 2022-01-04 ENCOUNTER — Other Ambulatory Visit: Payer: Self-pay | Admitting: Nurse Practitioner

## 2022-01-04 DIAGNOSIS — E079 Disorder of thyroid, unspecified: Secondary | ICD-10-CM

## 2022-01-21 ENCOUNTER — Other Ambulatory Visit: Payer: Self-pay | Admitting: Adult Health

## 2022-01-21 DIAGNOSIS — G4700 Insomnia, unspecified: Secondary | ICD-10-CM

## 2022-01-22 ENCOUNTER — Encounter: Payer: Self-pay | Admitting: Nurse Practitioner

## 2022-01-22 ENCOUNTER — Other Ambulatory Visit: Payer: Self-pay | Admitting: Nurse Practitioner

## 2022-01-22 DIAGNOSIS — I10 Essential (primary) hypertension: Secondary | ICD-10-CM

## 2022-01-22 DIAGNOSIS — G4700 Insomnia, unspecified: Secondary | ICD-10-CM

## 2022-01-22 MED ORDER — QUINAPRIL HCL 40 MG PO TABS: ORAL_TABLET | ORAL | 3 refills | Status: DC

## 2022-01-23 ENCOUNTER — Other Ambulatory Visit: Payer: Self-pay | Admitting: Nurse Practitioner

## 2022-01-23 DIAGNOSIS — I10 Essential (primary) hypertension: Secondary | ICD-10-CM

## 2022-01-23 MED ORDER — LOSARTAN POTASSIUM 50 MG PO TABS: 50.0000 mg | ORAL_TABLET | Freq: Every day | ORAL | 3 refills | Status: DC

## 2022-03-19 ENCOUNTER — Other Ambulatory Visit: Payer: Self-pay | Admitting: Nurse Practitioner

## 2022-04-29 ENCOUNTER — Other Ambulatory Visit: Payer: Self-pay | Admitting: Nurse Practitioner

## 2022-04-29 DIAGNOSIS — G4700 Insomnia, unspecified: Secondary | ICD-10-CM

## 2022-05-18 ENCOUNTER — Encounter: Payer: 59 | Admitting: Nurse Practitioner

## 2022-05-25 ENCOUNTER — Other Ambulatory Visit: Payer: Self-pay | Admitting: Internal Medicine

## 2022-05-25 DIAGNOSIS — Z1231 Encounter for screening mammogram for malignant neoplasm of breast: Secondary | ICD-10-CM

## 2022-05-28 NOTE — Progress Notes (Unsigned)
 Complete Physical  Assessment and Plan:  Shannon Pope was seen today for annual exam.  Diagnoses and all orders for this visit:  Encounter for general adult medical examination with abnormal findings Due Yearly Pap done today Mammogram scheduled later this month. Colonoscopy UTD  Abdominal aortic atherosclerosis ([place]) Control blood pressure, blood sugars, cholesterol and weight.  Type 2 diabetes mellitus treated without insulin (HCC) Currently well controlled without medication Continue diet, exercise and weight loss  Hyperlipidemia associated with type 2 diabetes mellitus ([place]) LDL Currently not at goal Continue diet and exercise.  Perform daily foot/skin check, notify office of any concerning changes.  Check A1C   Class 2 severe obesity due to excess calories with serious comorbidity in adult, unspecified BMI (HCC) Continue diet, exercise and focus on weight loss Limiting saturated fat and simple carbs and continue increased activity  Type 2 diabetes mellitus with hyperlipidemia (HCC) LDL Currently not at goal Continue diet and exercise.  Perform daily foot/skin check, notify office of any concerning changes.  -     COMPLETE METABOLIC PANEL WITH GFR -     CBC with Differential/Platelet -     Lipid panel -     Hemoglobin A1c -     Urinalysis, Routine w reflex microscopic -     Microalbumin / creatinine urine ratio  Essential hypertension - continue medications, DASH diet, exercise and monitor at home. Call if greater than 130/80.  Go to the ER if any chest pain, shortness of breath, nausea, dizziness, severe HA, changes vision/speech  -     EKG 12-Lead -     Urinalysis, Routine w reflex microscopic -     Microalbumin / creatinine urine ratio  Hypothyroidism, unspecified type Please take your thyroid medication greater than 30 min before breakfast, separated by at least 4 hours  from antacids, calcium, iron, and multivitamins.  -     CBC with Differential/Platelet -      TSH  Nonalcoholic fatty liver disease - CMP Continue diet, exercise and weight loss, limit Tylenol and alcohol  Vitamin D deficiency Continue Vit D supplementation to maintain value in therapeutic level of 60-100  -     VITAMIN D 25 Hydroxy (Vit-D Deficiency, Fractures)  Other osteoarthritis involving multiple joints Continue to monitor and use Mobic as needed  Medication management -     Magnesium  Screening for ischemic heart disease -     EKG 12-Lead  Screening for hematuria or proteinuria -     Urinalysis, Routine w reflex microscopic -     Microalbumin / creatinine urine ratio  Screening for cervical cancer -     PAP, TP IMAGING, WNL RFLX HPV        Discussed med's effects and SE's. Screening labs and tests as requested with regular follow-up as recommended.  Future Appointments  Date Time Provider IXL  06/14/2022  7:50 AM [facility] 2 [facility]  05/30/2023  9:00 AM Shannon Rossetti, NP [facility] None     HPI 61 y.o. female  presents for a complete physical. has Thyroid disease; Hypertension; Hyperlipidemia; Vitamin D deficiency; Type 2 diabetes mellitus with hyperlipidemia ([place]); Medication management; Bilateral fibrocystic breast disease (FCBD); Nonalcoholic fatty liver disease; History of nephrolithiasis; Insomnia; Prolonged QT interval; and Left lower lobe pulmonary nodule on their problem list.   She has had elevated blood pressure since 2000. Her blood pressure has been controlled at home, today their BP is BP: 104/78  . She is currently on Diltiazem  240 mg, HCTZ 32m , Losartan 50 mg daily.  Denies headache, chest pain, shortness of breath and dizziness BP Readings from Last 3 Encounters:  05/29/22 104/78  11/29/21 118/72  09/26/21 127/76      BMI is Body mass index is 29.52 kg/m., she is working on diet and exercise. Could not tolerate phentermine. Ating more fruits and vegetables.  Wt Readings from Last 3 Encounters:   05/29/22 172 lb (78 kg)  11/29/21 182 lb 3.2 oz (82.6 kg)  09/26/21 187 lb (84.8 kg)   She does workout, walks 5 miles a day. She denies chest pain, shortness of breath, dizziness.   She is not on cholesterol medication and denies myalgias. Her cholesterol is at goal. The cholesterol last visit was:   Lab Results  Component Value Date   CHOL 188 11/29/2021   HDL 43 (L) 11/29/2021   LDLCALC 112 (H) 11/29/2021   TRIG 209 (H) 11/29/2021   CHOLHDL 4.4 11/29/2021    She has been working on diet and exercise for prediabetes, and denies paresthesia of the feet, polydipsia and polyuria. Last A1C in the office was:  Lab Results  Component Value Date   HGBA1C 5.5 11/29/2021   Lab Results  Component Value Date   EGFR 106 11/29/2021    Patient is on Vitamin D supplement,  5000 every day.    She is on thyroid medication. She is on 200 mcg daily except M-W-F 1/2 tab. Had an allergic reactions to the generic synthroid, her insurance is not covering the brand name. Will send directly to synthroid  Lab Results  Component Value Date   TSH 0.01 (L) 11/29/2021  .  Has a history of kidney stones, follows with Dr. MMatilde Sprang  Very rare trouble sleeping, takes xanax PRN if this happens.  Sister and mother passed in N2015/11/18 stroke and sudden death due heart valve disease.  Has had 3 sisters with carotid artery disease, have had stroke, 70's, upper 60's, smokers, she denies symptoms of dizziness, vision changes- explained that we will monitor and control risk factors but no need for further testing at this time. .  Current Medications:  Current Outpatient Medications on File Prior to Visit  Medication Sig   ALPRAZolam (XANAX) 0.5 MG tablet TAKE 1/2 TO 1 TABLET BY MOUTH 2  TO 3 TIMES DAILY ONLY IF NEEDED  FOR ANXIETY ATTACK LIMIT TO 5  DAYS PER WEEK TO AVOID ADDICTION AND DEMENTIA   aspirin 81 MG tablet Take 81 mg by mouth daily.   cholecalciferol (VITAMIN D) 1000 UNITS tablet Take 10,000 Units by  mouth daily.    clotrimazole-betamethasone (LOTRISONE) cream Apply 1 application topically 2 (two) times daily.   diltiazem (CARDIZEM CD) 240 MG 24 hr capsule Take  1 capsule  Daily  for BP                                                            /                                       TAKE 1 CAPSULE BY MOUTH   hydrochlorothiazide (HYDRODIURIL) 25 MG tablet TAKE 1 TABLET BY MOUTH  DAILY FOR BLOOD PRESSURE  AND FLUID RETENTION/ANKLE  SWELLING   losartan (COZAAR) 50 MG tablet Take 1 tablet (50 mg total) by mouth daily.   meloxicam (MOBIC) 7.5 MG tablet Take 1 tablet (7.5 mg total) by mouth daily.   metFORMIN (GLUCOPHAGE XR) 500 MG 24 hr tablet Start with 1 tab with largest meal of the day and slowly increase as tolerated to 4 times a day with meals for blood sugar control   Multiple Vitamin (MULTIVITAMIN WITH MINERALS) TABS tablet Take 1 tablet by mouth daily.   Omega-3 Fatty Acids (FISH OIL PO) Take 1 capsule by mouth daily.   SYNTHROID 200 MCG tablet TAKE 1 TABLET DAILY FOR THYROID   zinc gluconate 50 MG tablet Take 50 mg by mouth daily.   No current facility-administered medications on file prior to visit.    Health Maintenance:   Immunization History  Administered Date(s) Administered   Pneumococcal-Unspecified 04/10/2010   Td 11/03/2008   Tdap 12/20/2017   Tetanus: 2019 Pneumovax: 2011 Prevnar 64: age 43 Flu vaccine: will get in Nov this year Zostavax: N/A  Pap: 07/20/2019 due today MGM: 05/2021 negative, scheduled for 06/15/22 DEXA: N/A Colonoscopy:09/2021  Dr. Havery Moros EGD: N/A CXR: DUE CT AB 2014 [place] AB 2014 fatty liver  Medical History:  Past Medical History:  Diagnosis Date   Allergy    Anxiety    Arthritis    hip   Cataract    removed bilateral- + lens implants   Fatty liver disease, nonalcoholic 02/63/7858   Via U/S   GERD (gastroesophageal reflux disease)    Hyperlipidemia    Hypertension    Insomnia    Nephrolithiasis    Prediabetes    on metformin    Prolonged QT interval    Thyroid disease    Vitamin D deficiency    Allergies Allergies  Allergen Reactions   Atenolol     Beta blockers. Pt doesn't remember there being a problem   Codeine Nausea Only   Klonopin [Clonazepam] Rash   Penicillins Rash    SURGICAL HISTORY She  has a past surgical history that includes Cholecystectomy; Tubal ligation; Lithotripsy (2016); Breast biopsy (Left); Colonoscopy; Polypectomy; and Knee arthroscopy (Left). FAMILY HISTORY Her family history includes Heart Problems (age of onset: 13) in her mother; Hypertension in her mother; Peripheral Artery Disease in her sister. SOCIAL HISTORY She  reports that she quit smoking about 28 years ago. Her smoking use included cigarettes. She has a 10.00 pack-year smoking history. She has never used smokeless tobacco. She reports current alcohol use of about 2.0 standard drinks of alcohol per week. She reports that she does not use drugs.  Review of Systems  Constitutional:  Negative for chills, diaphoresis, fever, malaise/fatigue and weight loss.  HENT: Negative.    Eyes: Negative.   Respiratory: Negative.  Negative for cough and shortness of breath.   Cardiovascular: Negative.  Negative for chest pain.  Genitourinary: Negative.  Negative for dysuria, frequency and urgency.  Musculoskeletal:  Negative for back pain, falls, joint pain, myalgias and neck pain.  Skin: Negative.   Neurological: Negative.  Negative for dizziness and weakness.  Psychiatric/Behavioral:  Negative for depression, hallucinations, memory loss, substance abuse and suicidal ideas. The patient has insomnia (occ will take xanax). The patient is not nervous/anxious.     Physical Exam: Estimated body mass index is 29.52 kg/m as calculated from the following:   Height as of this encounter: '5\' 4"'  (1.626 m).   Weight as of this encounter: 172 lb (78 kg).  BP 104/78   Pulse (!) 59   Temp (!) 97.1 F (36.2 C)   Ht '5\' 4"'  (1.626 m)   Wt 172 lb  (78 kg)   LMP 09/01/2013   SpO2 99%   BMI 29.52 kg/m  General Appearance: Well nourished, in no apparent distress. Eyes: PERRLA, EOMs, conjunctiva no swelling or erythema, normal fundi and vessels. Sinuses: No Frontal/maxillary tenderness ENT/Mouth: Ext aud canals clear, normal light reflex with TMs without erythema, bulging.  Good dentition. No erythema, swelling, or exudate on post pharynx. Tonsils not swollen or erythematous. Hearing normal.  Neck: Supple, thyroid normal. No bruits Respiratory: Respiratory effort normal, BS equal bilaterally without rales, rhonchi, wheezing or stridor. Cardio: RRR without murmurs, rubs or gallops. Brisk peripheral pulses without edema.  Chest: symmetric, with normal excursions and percussion. Breasts: breasts appear normal, no suspicious masses, no skin or nipple changes or axillary nodes. Abdomen: Soft, +BS. Non tender, no guarding, rebound, hernias, masses, or organomegaly.  Lymphatics: Non tender without lymphadenopathy.  Pelvic exam: normal external genitalia, vulva, vagina, cervix, uterus and adnexa, PAP: Pap smear done today, thin-prep method. Musculoskeletal: Full ROM all peripheral extremities,5/5 strength, and normal gait. Skin: Warm, dry without rashes, lesions, ecchymosis.  Neuro: Cranial nerves intact, reflexes equal bilaterally. Normal muscle tone, no cerebellar symptoms. Sensation intact.  Psych: Awake and oriented X 3, normal affect, Insight and Judgment appropriate.   EKG: NSR, No ST changes AORTA SCAN:  defer   Melanee Cordial E  9:11 AM

## 2022-05-29 ENCOUNTER — Encounter: Payer: Self-pay | Admitting: Nurse Practitioner

## 2022-05-29 ENCOUNTER — Ambulatory Visit: Payer: 59 | Admitting: Nurse Practitioner

## 2022-05-29 VITALS — BP 104/78 | HR 59 | Temp 97.1°F | Ht 64.0 in | Wt 172.0 lb

## 2022-05-29 DIAGNOSIS — E119 Type 2 diabetes mellitus without complications: Secondary | ICD-10-CM

## 2022-05-29 DIAGNOSIS — K760 Fatty (change of) liver, not elsewhere classified: Secondary | ICD-10-CM

## 2022-05-29 DIAGNOSIS — M158 Other polyosteoarthritis: Secondary | ICD-10-CM

## 2022-05-29 DIAGNOSIS — Z0000 Encounter for general adult medical examination without abnormal findings: Secondary | ICD-10-CM | POA: Diagnosis not present

## 2022-05-29 DIAGNOSIS — E039 Hypothyroidism, unspecified: Secondary | ICD-10-CM

## 2022-05-29 DIAGNOSIS — I10 Essential (primary) hypertension: Secondary | ICD-10-CM

## 2022-05-29 DIAGNOSIS — Z0001 Encounter for general adult medical examination with abnormal findings: Secondary | ICD-10-CM

## 2022-05-29 DIAGNOSIS — Z136 Encounter for screening for cardiovascular disorders: Secondary | ICD-10-CM

## 2022-05-29 DIAGNOSIS — I700 Atherosclerosis of aorta: Secondary | ICD-10-CM

## 2022-05-29 DIAGNOSIS — E1169 Type 2 diabetes mellitus with other specified complication: Secondary | ICD-10-CM

## 2022-05-29 DIAGNOSIS — Z1389 Encounter for screening for other disorder: Secondary | ICD-10-CM

## 2022-05-29 DIAGNOSIS — Z79899 Other long term (current) drug therapy: Secondary | ICD-10-CM

## 2022-05-29 DIAGNOSIS — E559 Vitamin D deficiency, unspecified: Secondary | ICD-10-CM

## 2022-05-29 DIAGNOSIS — Z124 Encounter for screening for malignant neoplasm of cervix: Secondary | ICD-10-CM

## 2022-05-29 NOTE — Patient Instructions (Signed)

## 2022-05-30 ENCOUNTER — Other Ambulatory Visit: Payer: Self-pay | Admitting: Nurse Practitioner

## 2022-05-30 DIAGNOSIS — E039 Hypothyroidism, unspecified: Secondary | ICD-10-CM

## 2022-05-30 DIAGNOSIS — R8290 Unspecified abnormal findings in urine: Secondary | ICD-10-CM

## 2022-05-30 LAB — CBC WITH DIFFERENTIAL/PLATELET
Absolute Monocytes: 585 cells/uL (ref 200–950)
Basophils Absolute: 59 cells/uL (ref 0–200)
Basophils Relative: 0.9 %
Eosinophils Absolute: 150 cells/uL (ref 15–500)
Eosinophils Relative: 2.3 %
HCT: 38.3 % (ref 35.0–45.0)
Hemoglobin: 13 g/dL (ref 11.7–15.5)
Lymphs Abs: 2002 cells/uL (ref 850–3900)
MCH: 27.9 pg (ref 27.0–33.0)
MCHC: 33.9 g/dL (ref 32.0–36.0)
MCV: 82.2 fL (ref 80.0–100.0)
MPV: 11.3 fL (ref 7.5–12.5)
Monocytes Relative: 9 %
Neutro Abs: 3705 cells/uL (ref 1500–7800)
Neutrophils Relative %: 57 %
Platelets: 235 10*3/uL (ref 140–400)
RBC: 4.66 10*6/uL (ref 3.80–5.10)
RDW: 15.1 % — ABNORMAL HIGH (ref 11.0–15.0)
Total Lymphocyte: 30.8 %
WBC: 6.5 10*3/uL (ref 3.8–10.8)

## 2022-05-30 LAB — COMPLETE METABOLIC PANEL WITH GFR
AG Ratio: 1.8 (calc) (ref 1.0–2.5)
ALT: 24 U/L (ref 6–29)
AST: 25 U/L (ref 10–35)
Albumin: 4.3 g/dL (ref 3.6–5.1)
Alkaline phosphatase (APISO): 131 U/L (ref 37–153)
BUN: 12 mg/dL (ref 7–25)
CO2: 29 mmol/L (ref 20–32)
Calcium: 9.6 mg/dL (ref 8.6–10.4)
Chloride: 101 mmol/L (ref 98–110)
Creat: 0.55 mg/dL (ref 0.50–1.05)
Globulin: 2.4 g/dL (calc) (ref 1.9–3.7)
Glucose, Bld: 89 mg/dL (ref 65–99)
Potassium: 4 mmol/L (ref 3.5–5.3)
Sodium: 138 mmol/L (ref 135–146)
Total Bilirubin: 0.7 mg/dL (ref 0.2–1.2)
Total Protein: 6.7 g/dL (ref 6.1–8.1)
eGFR: 104 mL/min/{1.73_m2} (ref >=60)

## 2022-05-30 LAB — URINALYSIS, ROUTINE W REFLEX MICROSCOPIC
Bilirubin Urine: NEGATIVE
Glucose, UA: NEGATIVE
Ketones, ur: NEGATIVE
Nitrite: POSITIVE — AB
Specific Gravity, Urine: 1.018 (ref 1.001–1.035)
pH: 5.5 (ref 5.0–8.0)

## 2022-05-30 LAB — LIPID PANEL
Cholesterol: 161 mg/dL (ref <200)
HDL: 49 mg/dL — ABNORMAL LOW (ref >=50)
LDL Cholesterol (Calc): 91 mg/dL (calc)
Non-HDL Cholesterol (Calc): 112 mg/dL (calc) (ref <130)
Total CHOL/HDL Ratio: 3.3 (calc) (ref <5.0)
Triglycerides: 118 mg/dL (ref <150)

## 2022-05-30 LAB — TSH: TSH: 0.01 mIU/L — ABNORMAL LOW (ref 0.40–4.50)

## 2022-05-30 LAB — MICROALBUMIN / CREATININE URINE RATIO
Creatinine, Urine: 151 mg/dL (ref 20–275)
Microalb Creat Ratio: 21 mcg/mg creat (ref <30)
Microalb, Ur: 3.2 mg/dL

## 2022-05-30 LAB — HEMOGLOBIN A1C
Hgb A1c MFr Bld: 5.3 % of total Hgb (ref <5.7)
Mean Plasma Glucose: 105 mg/dL
eAG (mmol/L): 5.8 mmol/L

## 2022-05-30 LAB — MAGNESIUM: Magnesium: 1.9 mg/dL (ref 1.5–2.5)

## 2022-05-30 LAB — VITAMIN D 25 HYDROXY (VIT D DEFICIENCY, FRACTURES): Vit D, 25-Hydroxy: 63 ng/mL (ref 30–100)

## 2022-05-30 LAB — MICROSCOPIC MESSAGE

## 2022-05-31 ENCOUNTER — Other Ambulatory Visit: Payer: Self-pay | Admitting: Internal Medicine

## 2022-05-31 DIAGNOSIS — E1169 Type 2 diabetes mellitus with other specified complication: Secondary | ICD-10-CM

## 2022-05-31 LAB — PAP, TP IMAGING W/ HPV RNA, RFLX HPV TYPE 16,18/45: HPV DNA High Risk: NOT DETECTED

## 2022-05-31 LAB — PAP, TP IMAGING, WNL RFLX HPV

## 2022-05-31 MED ORDER — METFORMIN HCL ER 500 MG PO TB24: ORAL_TABLET | ORAL | 3 refills | Status: DC

## 2022-06-01 ENCOUNTER — Other Ambulatory Visit: Payer: Self-pay | Admitting: Nurse Practitioner

## 2022-06-01 ENCOUNTER — Encounter: Payer: Self-pay | Admitting: Nurse Practitioner

## 2022-06-01 DIAGNOSIS — N3090 Cystitis, unspecified without hematuria: Secondary | ICD-10-CM

## 2022-06-01 DIAGNOSIS — E1169 Type 2 diabetes mellitus with other specified complication: Secondary | ICD-10-CM

## 2022-06-01 LAB — URINE CULTURE
MICRO NUMBER:: 13637958
SPECIMEN QUALITY:: ADEQUATE

## 2022-06-01 MED ORDER — NITROFURANTOIN MONOHYD MACRO 100 MG PO CAPS: 100.0000 mg | ORAL_CAPSULE | Freq: Two times a day (BID) | ORAL | 0 refills | Status: AC

## 2022-06-01 MED ORDER — METFORMIN HCL ER 500 MG PO TB24: ORAL_TABLET | ORAL | 3 refills | Status: DC

## 2022-06-14 ENCOUNTER — Ambulatory Visit
Admission: RE | Admit: 2022-06-14 | Discharge: 2022-06-14 | Disposition: A | Discharge status: Home / Self Care | Payer: 59 | Source: Ambulatory Visit | Attending: Internal Medicine | Admitting: Internal Medicine

## 2022-06-14 DIAGNOSIS — Z1231 Encounter for screening mammogram for malignant neoplasm of breast: Secondary | ICD-10-CM

## 2022-06-18 ENCOUNTER — Other Ambulatory Visit: Payer: Self-pay | Admitting: Internal Medicine

## 2022-06-18 DIAGNOSIS — R928 Other abnormal and inconclusive findings on diagnostic imaging of breast: Secondary | ICD-10-CM

## 2022-06-19 HISTORY — PX: BREAST BIOPSY: SHX20

## 2022-06-25 ENCOUNTER — Other Ambulatory Visit: Payer: Self-pay | Admitting: Internal Medicine

## 2022-06-25 ENCOUNTER — Ambulatory Visit
Admission: RE | Admit: 2022-06-25 | Discharge: 2022-06-25 | Disposition: A | Discharge status: Home / Self Care | Payer: 59 | Source: Ambulatory Visit | Attending: Internal Medicine | Admitting: Internal Medicine

## 2022-06-25 DIAGNOSIS — R921 Mammographic calcification found on diagnostic imaging of breast: Secondary | ICD-10-CM

## 2022-06-25 DIAGNOSIS — R928 Other abnormal and inconclusive findings on diagnostic imaging of breast: Secondary | ICD-10-CM

## 2022-07-02 ENCOUNTER — Other Ambulatory Visit: Payer: 59

## 2022-07-02 DIAGNOSIS — E039 Hypothyroidism, unspecified: Secondary | ICD-10-CM

## 2022-07-03 LAB — TSH: TSH: 0.52 mIU/L (ref 0.40–4.50)

## 2022-07-06 ENCOUNTER — Ambulatory Visit
Admission: RE | Admit: 2022-07-06 | Discharge: 2022-07-06 | Disposition: A | Discharge status: Home / Self Care | Payer: 59 | Source: Ambulatory Visit | Attending: Internal Medicine | Admitting: Internal Medicine

## 2022-07-06 DIAGNOSIS — R921 Mammographic calcification found on diagnostic imaging of breast: Secondary | ICD-10-CM

## 2022-08-01 ENCOUNTER — Other Ambulatory Visit: Payer: Self-pay | Admitting: Nurse Practitioner

## 2022-08-01 DIAGNOSIS — G4700 Insomnia, unspecified: Secondary | ICD-10-CM

## 2022-10-12 ENCOUNTER — Other Ambulatory Visit: Payer: Self-pay | Admitting: Internal Medicine

## 2022-10-12 DIAGNOSIS — I10 Essential (primary) hypertension: Secondary | ICD-10-CM

## 2022-10-16 ENCOUNTER — Other Ambulatory Visit: Payer: Self-pay | Admitting: Nurse Practitioner

## 2022-10-16 DIAGNOSIS — G4700 Insomnia, unspecified: Secondary | ICD-10-CM

## 2022-10-16 DIAGNOSIS — I10 Essential (primary) hypertension: Secondary | ICD-10-CM

## 2022-10-31 ENCOUNTER — Encounter: Payer: Self-pay | Admitting: Internal Medicine

## 2022-11-02 ENCOUNTER — Other Ambulatory Visit: Payer: Self-pay | Admitting: Nurse Practitioner

## 2022-11-02 ENCOUNTER — Encounter: Payer: Self-pay | Admitting: Nurse Practitioner

## 2022-11-02 DIAGNOSIS — N898 Other specified noninflammatory disorders of vagina: Secondary | ICD-10-CM

## 2022-11-10 ENCOUNTER — Encounter: Payer: Self-pay | Admitting: Internal Medicine

## 2022-11-10 ENCOUNTER — Other Ambulatory Visit: Payer: Self-pay | Admitting: Internal Medicine

## 2022-11-10 MED ORDER — DEXAMETHASONE 4 MG PO TABS: ORAL_TABLET | ORAL | 0 refills | Status: DC

## 2022-11-16 ENCOUNTER — Other Ambulatory Visit: Payer: Self-pay | Admitting: Nurse Practitioner

## 2022-11-16 DIAGNOSIS — G4700 Insomnia, unspecified: Secondary | ICD-10-CM

## 2022-12-03 ENCOUNTER — Ambulatory Visit: Payer: 59 | Admitting: Nurse Practitioner

## 2022-12-03 NOTE — Progress Notes (Deleted)
3 MONTH FOLLOW UP  Assessment and Plan:  Tamanika was seen today for follow-up.  Diagnoses and all orders for this visit:  Type 2 diabetes mellitus with hyperlipidemia ([place])  Continue metformin, Diet and exercise.  Abdominal aortic atherosclerosis (HCC)  Control blood pressure, cholesterol and weight  Type 2 diabetes mellitus treated without insulin (HCC) Continue medications: Continue diet and exercise.  Perform daily foot/skin check, notify office of any concerning changes.  Check A1C  -     Hemoglobin A1c   Hyperlipidemia associated with Type 2 DM(HCC) Continue diet and exercise     - CBC with Differential/Platelet -     COMPLETE METABOLIC PANEL WITH GFR -     Lipid panel - Continue diet and exercise  Essential hypertension -     CBC with Differential/Platelet -     Microalbumin / creatinine urine ratio  - continue medications, DASH diet, exercise and monitor at home. Call if greater than 130/80.    Class 2 drug-induced obesity with serious comorbidity and body mass index (BMI) of 34- 34.9  Pt has lost 15 pounds in last 2 months  Continue diet and exercise  Hypothyroidism, unspecified type -     TSH - Continue current dose of Synthroid(requires brand name due to allergic reaction with generic), will adjust pending lab results Please take your thyroid medication greater than 30 min before breakfast, separated by at least 4 hours  from antacids, calcium, iron, and multivitamins.    Osteoarthritis Begin Meloxicam as needed Continue exercise, diet and weight loss  Continue diet and meds as discussed. Further disposition pending results of labs. Discussed med's effects and SE's.   Over 30 minutes of face to face interview, exam, counseling, chart review, and critical decision making was performed.  Future Appointments  Date Time Provider [place]  12/04/2022  4:00 PM [name], NP [facility] None  05/30/2023  9:00 AM [name], NP [facility] None      HPI 62 y.o. female  presents for a complete physical.  She has had elevated blood pressure since 2000. Her blood pressure has been controlled at home, today their BP is     BP Readings from Last 3 Encounters:  05/29/22 104/78  11/29/21 118/72  09/26/21 127/76    BMI is There is no height or weight on file to calculate BMI., she is working on diet and exercise. Could not tolerate phentermine.  Wt Readings from Last 3 Encounters:  05/29/22 172 lb (78 kg)  11/29/21 182 lb 3.2 oz (82.6 kg)  09/26/21 187 lb (84.8 kg)   She does workout, walks 5 miles a day. She denies chest pain, shortness of breath, dizziness.   She is not on cholesterol medication and denies myalgias. Her cholesterol is at goal. Pt has been eating less fat and getting more exercise. The cholesterol last visit was:   Lab Results  Component Value Date   CHOL 161 05/29/2022   HDL 49 (L) 05/29/2022   LDLCALC 91 05/29/2022   TRIG 118 05/29/2022   CHOLHDL 3.3 05/29/2022    She has been working on diet and exercise for prediabetes, and denies paresthesia of the feet, polydipsia and polyuria. Currently taking Metformin 1 tab 4 times a day. Last A1C in the office was:  Lab Results  Component Value Date   HGBA1C 5.3 05/29/2022   Lab Results  Component Value Date   GFRNONAA 101 05/18/2021   Patient is on Vitamin D supplement,  5000 every day.  She is on thyroid medication. Her medication was changed last visit, she is on 200 mcg daily, Wednesday taking 1/2 tab. Had an allergic reactions to the generic synthroid, her insurance is not covering the brand name. Will send directly to synthroid.  Lab Results  Component Value Date   TSH 0.52 07/02/2022  .  Has a history of kidney stones, follows with Dr. Matilde Sprang.  Very rare trouble sleeping, takes xanax PRN if this happens.  Sister and mother passed in October 01, 2014, stroke and sudden death due heart valve disease.  Has had 3 sisters with carotid artery disease, have  had stroke, 70's, upper 60's, smokers, she denies symptoms of dizziness, vision changes- explained that we will monitor and control risk factors but no need for further testing at this time. . Coronary Calcium Score 10/2019 was 0.  Current Medications:  Current Outpatient Medications on File Prior to Visit  Medication Sig   ALPRAZolam (XANAX) 0.5 MG tablet TAKE 1/2 TO 1 TABLET BY MOUTH 2  TO 3 TIMES DAILY IF NEEDED FOR  ANXIETY ATTACK. LIMIT TO 5 DAYS  PER WEEK TO AVOID ADDICTION AND  DEMENTIA   aspirin 81 MG tablet Take 81 mg by mouth daily.   cholecalciferol (VITAMIN D) 1000 UNITS tablet Take 10,000 Units by mouth daily.    clotrimazole-betamethasone (LOTRISONE) cream Apply 1 application topically 2 (two) times daily.   dexamethasone (DECADRON) 4 MG tablet Take 1 tab 3 x /day for 2 days,      then 2 x /day for 2  Days,     then 1 tab daily   diltiazem (CARDIZEM CD) 240 MG 24 hr capsule Take  1 capsule  Daily  for BP                                                                                      /                           TAKE                                           BY                                   MOUTH   hydrochlorothiazide (HYDRODIURIL) 25 MG tablet TAKE 1 TABLET BY MOUTH  DAILY FOR BLOOD PRESSURE  AND FLUID RETENTION/ANKLE  SWELLING   losartan (COZAAR) 50 MG tablet TAKE 1 TABLET BY MOUTH DAILY   metFORMIN (GLUCOPHAGE XR) 500 MG 24 hr tablet Take 1 to 2 tablets 2 x /day  for Diabetes   Multiple Vitamin (MULTIVITAMIN WITH MINERALS) TABS tablet Take 1 tablet by mouth daily.   Omega-3 Fatty Acids (FISH OIL PO) Take 1 capsule by mouth daily.   SYNTHROID 200 MCG tablet TAKE 1 TABLET DAILY FOR THYROID   zinc gluconate 50 MG tablet Take 50 mg by mouth daily.   No current facility-administered  medications on file prior to visit.    Health Maintenance:   Immunization History  Administered Date(s) Administered   Pneumococcal-Unspecified 04/10/2010   Td 11/03/2008   Tdap 12/20/2017    Tetanus: 2019 Pneumovax: 2011 Prevnar 16: age 23 Flu vaccine: will get in Nov this year Zostavax: N/A  Pap: 06/2019 normal needs repeat 2023 MGM: 3D mammogram 08/20/2020 benign to follow up 1 year, pt calling to schedule DEXA: N/A Colonoscopy:12/ 2016 Dr. Deatra Ina Dr. Havery Moros removed 1 polyp , pt to schedule next colonoscopy  EGD: N/A CXR: DUE CT AB 2014 [place] AB 2014 fatty liver Catarracts removed bilaterally 2022  Medical History:  Past Medical History:  Diagnosis Date   Allergy    Anxiety    Arthritis    hip   Cataract    removed bilateral- + lens implants   Fatty liver disease, nonalcoholic [id number]   Via U/S   GERD (gastroesophageal reflux disease)    Hyperlipidemia    Hypertension    Insomnia    Nephrolithiasis    Prediabetes    on metformin   Prolonged QT interval    Thyroid disease    Vitamin D deficiency    Allergies Allergies  Allergen Reactions   Atenolol     Beta blockers. Pt doesn't remember there being a problem   Codeine Nausea Only   Klonopin [Clonazepam] Rash   Penicillins Rash    SURGICAL HISTORY She  has a past surgical history that includes Cholecystectomy; Tubal ligation; Lithotripsy (2016); Breast biopsy (Left); Colonoscopy; Polypectomy; and Knee arthroscopy (Left). FAMILY HISTORY Her family history includes Heart Problems (age of onset: 70) in her mother; Hypertension in her mother; Peripheral Artery Disease in her sister. SOCIAL HISTORY She  reports that she quit smoking about 29 years ago. Her smoking use included cigarettes. She has a 10.00 pack-year smoking history. She has never used smokeless tobacco. She reports current alcohol use of about 2.0 standard drinks of alcohol per week. She reports that she does not use drugs.  Review of Systems  Constitutional: Negative.  Negative for chills, fever and weight loss.  HENT: Negative.  Negative for congestion and hearing loss.   Eyes: Negative.  Negative for blurred vision and  double vision.  Respiratory:  Negative for cough, hemoptysis, shortness of breath and wheezing.   Cardiovascular:  Negative for chest pain, palpitations, orthopnea and leg swelling.  Gastrointestinal:  Negative for abdominal pain, constipation, diarrhea, heartburn, nausea and vomiting.  Genitourinary:  Negative for dysuria, frequency and urgency.  Musculoskeletal:  Negative for back pain, falls, joint pain, myalgias and neck pain.  Skin: Negative.  Negative for rash.  Neurological:  Negative for dizziness, tingling, tremors, loss of consciousness, weakness and headaches.  Endo/Heme/Allergies:  Does not bruise/bleed easily.  Psychiatric/Behavioral:  Negative for depression, memory loss and suicidal ideas. The patient has insomnia (1/2 xanax 0.5 mg prn).      Physical Exam: Estimated body mass index is 29.52 kg/m as calculated from the following:   Height as of 05/29/22: 5' 4"$  (1.626 m).   Weight as of 05/29/22: 172 lb (78 kg). LMP 09/01/2013  General Appearance: Well nourished, in no apparent distress. Eyes: PERRLA, EOMs, conjunctiva no swelling or erythema, normal fundi and vessels. Sinuses: No Frontal/maxillary tenderness ENT/Mouth: Ext aud canals clear, normal light reflex with TMs without erythema, bulging.  Good dentition. No erythema, swelling, or exudate on post pharynx. Tonsils not swollen or erythematous. Hearing normal.  Neck: Supple, thyroid normal. No bruits Respiratory: Respiratory effort normal,  BS equal bilaterally without rales, rhonchi, wheezing or stridor. Cardio: RRR without murmurs, rubs or gallops. Brisk peripheral pulses without edema.  Chest: symmetric, with normal excursions and percussion. Abdomen: Soft, +BS. Non tender, no guarding, rebound, hernias, masses, or organomegaly.  Lymphatics: Non tender without lymphadenopathy.  Musculoskeletal: Full ROM all peripheral extremities,5/5 strength, and normal gait. Skin: Warm, dry without rashes, lesions, ecchymosis.   Neuro: Cranial nerves intact, reflexes equal bilaterally. Normal muscle tone, no cerebellar symptoms. Sensation intact.  Psych: Awake and oriented X 3, normal affect, Insight and Judgment appropriate.     Magda Bernheim ANP-C  [facility]  12/03/2022

## 2022-12-04 ENCOUNTER — Ambulatory Visit: Payer: 59 | Admitting: Nurse Practitioner

## 2022-12-04 DIAGNOSIS — Z79899 Other long term (current) drug therapy: Secondary | ICD-10-CM

## 2022-12-04 DIAGNOSIS — M158 Other polyosteoarthritis: Secondary | ICD-10-CM

## 2022-12-04 DIAGNOSIS — I700 Atherosclerosis of aorta: Secondary | ICD-10-CM

## 2022-12-04 DIAGNOSIS — E039 Hypothyroidism, unspecified: Secondary | ICD-10-CM

## 2022-12-04 DIAGNOSIS — E559 Vitamin D deficiency, unspecified: Secondary | ICD-10-CM

## 2022-12-04 DIAGNOSIS — I10 Essential (primary) hypertension: Secondary | ICD-10-CM

## 2022-12-04 DIAGNOSIS — E1169 Type 2 diabetes mellitus with other specified complication: Secondary | ICD-10-CM

## 2022-12-04 DIAGNOSIS — E119 Type 2 diabetes mellitus without complications: Secondary | ICD-10-CM

## 2022-12-06 ENCOUNTER — Telehealth: Payer: 59 | Admitting: Physician Assistant

## 2022-12-06 DIAGNOSIS — R6889 Other general symptoms and signs: Secondary | ICD-10-CM | POA: Diagnosis not present

## 2022-12-06 MED ORDER — OSELTAMIVIR PHOSPHATE 75 MG PO CAPS: 75.0000 mg | ORAL_CAPSULE | Freq: Two times a day (BID) | ORAL | 0 refills | Status: DC

## 2022-12-06 MED ORDER — PROMETHAZINE-DM 6.25-15 MG/5ML PO SYRP: 5.0000 mL | ORAL_SOLUTION | Freq: Three times a day (TID) | ORAL | 0 refills | Status: DC | PRN

## 2022-12-06 NOTE — Progress Notes (Signed)
E visit for Flu like symptoms   We are sorry that you are not feeling well.  Here is how we plan to help! Based on what you have shared with me it looks like you may have a respiratory virus that may be influenza.  Influenza or "the flu" is   an infection caused by a respiratory virus. The flu virus is highly contagious and persons who did not receive their yearly flu vaccination may "catch" the flu from close contact.  We have anti-viral medications to treat the viruses that cause this infection. They are not a "cure" and only shorten the course of the infection. These prescriptions are most effective when they are given within the first 2 days of "flu" symptoms. Antiviral medication are indicated if you have a high risk of complications from the flu. You should  also consider an antiviral medication if you are in close contact with someone who is at risk. These medications can help patients avoid complications from the flu  but have side effects that you should know. Possible side effects from Tamiflu or oseltamivir include nausea, vomiting, diarrhea, dizziness, headaches, eye redness, sleep problems or other respiratory symptoms. You should not take Tamiflu if you have an allergy to oseltamivir or any to the ingredients in Tamiflu.  Based upon your symptoms and potential risk factors I have prescribed Oseltamivir (Tamiflu).  It has been sent to your designated pharmacy.  You will take one 75 mg capsule orally twice a day for the next 5 days. And Promethazine DM cough syrup  ANYONE WHO HAS FLU SYMPTOMS SHOULD: Stay home. The flu is highly contagious and going out or to work exposes others! Be sure to drink plenty of fluids. Water is fine as well as fruit juices, sodas and electrolyte beverages. You may want to stay away from caffeine or alcohol. If you are nauseated, try taking small sips of liquids. How do you know if you are getting enough fluid? Your urine should be a pale yellow or almost  colorless. Get rest. Taking a steamy shower or using a humidifier may help nasal congestion and ease sore throat pain. Using a saline nasal spray works much the same way. Cough drops, hard candies and sore throat lozenges may ease your cough. Line up a caregiver. Have someone check on you regularly.   GET HELP RIGHT AWAY IF: You cannot keep down liquids or your medications. You become short of breath Your fell like you are going to pass out or loose consciousness. Your symptoms persist after you have completed your treatment plan MAKE SURE YOU  Understand these instructions. Will watch your condition. Will get help right away if you are not doing well or get worse.  Your e-visit answers were reviewed by a board certified advanced clinical practitioner to complete your personal care plan.  Depending on the condition, your plan could have included both over the counter or prescription medications.  If there is a problem please reply  once you have received a response from your provider.  Your safety is important to [place].  If you have drug allergies check your prescription carefully.    You can use MyChart to ask questions about today's visit, request a non-urgent call back, or ask for a work or school excuse for 24 hours related to this e-Visit. If it has been greater than 24 hours you will need to follow up with your provider, or enter a new e-Visit to address those concerns.  You will get  an e-mail in the next two days asking about your experience.  I hope that your e-visit has been valuable and will speed your recovery. Thank you for using e-visits.  I have spent 5 minutes in review of e-visit questionnaire, review and updating patient chart, medical decision making and response to patient.   Mar Daring, PA-C

## 2022-12-11 ENCOUNTER — Other Ambulatory Visit: Payer: Self-pay | Admitting: Internal Medicine

## 2022-12-11 ENCOUNTER — Encounter: Payer: Self-pay | Admitting: Internal Medicine

## 2022-12-11 DIAGNOSIS — R6889 Other general symptoms and signs: Secondary | ICD-10-CM

## 2022-12-11 DIAGNOSIS — U071 COVID-19: Secondary | ICD-10-CM

## 2022-12-11 MED ORDER — PROMETHAZINE-DM 6.25-15 MG/5ML PO SYRP: 5.0000 mL | ORAL_SOLUTION | Freq: Three times a day (TID) | ORAL | 1 refills | Status: DC | PRN

## 2022-12-11 MED ORDER — DEXAMETHASONE 4 MG PO TABS: ORAL_TABLET | ORAL | 0 refills | Status: DC

## 2022-12-11 MED ORDER — BENZONATATE 200 MG PO CAPS: ORAL_CAPSULE | ORAL | 1 refills | Status: DC

## 2022-12-19 ENCOUNTER — Other Ambulatory Visit: Payer: Self-pay | Admitting: Internal Medicine

## 2022-12-19 MED ORDER — PSEUDOEPHEDRINE HCL ER 120 MG PO TB12: ORAL_TABLET | ORAL | 0 refills | Status: DC

## 2022-12-19 MED ORDER — AZITHROMYCIN 250 MG PO TABS: ORAL_TABLET | ORAL | 0 refills | Status: DC

## 2022-12-19 MED ORDER — DEXAMETHASONE 4 MG PO TABS: ORAL_TABLET | ORAL | 0 refills | Status: DC

## 2023-01-04 ENCOUNTER — Encounter: Payer: Self-pay | Admitting: Nurse Practitioner

## 2023-01-04 ENCOUNTER — Other Ambulatory Visit: Payer: Self-pay | Admitting: Internal Medicine

## 2023-01-04 DIAGNOSIS — U071 COVID-19: Secondary | ICD-10-CM

## 2023-01-14 ENCOUNTER — Ambulatory Visit (INDEPENDENT_AMBULATORY_CARE_PROVIDER_SITE_OTHER): Payer: 59 | Admitting: Nurse Practitioner

## 2023-01-14 ENCOUNTER — Other Ambulatory Visit: Payer: Self-pay

## 2023-01-14 ENCOUNTER — Encounter: Payer: Self-pay | Admitting: Nurse Practitioner

## 2023-01-14 VITALS — BP 148/82 | HR 92 | Temp 98.2°F | Ht 64.0 in | Wt 181.6 lb

## 2023-01-14 DIAGNOSIS — J069 Acute upper respiratory infection, unspecified: Secondary | ICD-10-CM

## 2023-01-14 DIAGNOSIS — R6889 Other general symptoms and signs: Secondary | ICD-10-CM | POA: Diagnosis not present

## 2023-01-14 DIAGNOSIS — Z1152 Encounter for screening for COVID-19: Secondary | ICD-10-CM | POA: Diagnosis not present

## 2023-01-14 DIAGNOSIS — R051 Acute cough: Secondary | ICD-10-CM | POA: Diagnosis not present

## 2023-01-14 DIAGNOSIS — I10 Essential (primary) hypertension: Secondary | ICD-10-CM | POA: Diagnosis not present

## 2023-01-14 LAB — POCT INFLUENZA A/B
Influenza A, POC: NEGATIVE
Influenza B, POC: NEGATIVE

## 2023-01-14 LAB — POC COVID19 BINAXNOW: SARS Coronavirus 2 Ag: NEGATIVE

## 2023-01-14 MED ORDER — PREDNISONE 10 MG PO TABS: ORAL_TABLET | ORAL | 1 refills | Status: DC

## 2023-01-14 MED ORDER — LEVOFLOXACIN 500 MG PO TABS: 500.0000 mg | ORAL_TABLET | Freq: Every day | ORAL | 0 refills | Status: AC

## 2023-01-14 MED ORDER — ACETAMINOPHEN-CODEINE 300-30 MG PO TABS: 1.0000 | ORAL_TABLET | Freq: Four times a day (QID) | ORAL | 0 refills | Status: DC | PRN

## 2023-01-14 NOTE — Progress Notes (Signed)
Assessment and Plan:  Diagnoses and all orders for this visit:  Flu-like symptoms -     POCT Influenza A/B- negative  Encounter for screening for COVID-19 -     POC COVID-19- negative  Essential hypertension - continue medications, DASH diet, exercise and monitor at home. Call if greater than 130/80.   URI, acute Push fluids, mucinex as needed to decongest If not improved in the next 3 days notify the office -     levofloxacin (LEVAQUIN) 500 MG tablet; Take 1 tablet (500 mg total) by mouth daily for 10 days. -     acetaminophen-codeine (TYLENOL/CODEINE #3) 300-30 MG tablet; Take 1 tablet by mouth every 6 (six) hours as needed for moderate pain. -     predniSONE (DELTASONE) 10 MG tablet; 3 tabs x 2 , 2 tabs x 2,  1 tab x 3  Acute cough -     acetaminophen-codeine (TYLENOL/CODEINE #3) 300-30 MG tablet; Take 1 tablet by mouth every 6 (six) hours as needed for moderate pain.       Further disposition pending results of labs. Discussed med's effects and SE's.   Over 30 minutes of exam, counseling, chart review, and critical decision making was performed.   Future Appointments  Date Time Provider [place]  05/30/2023  9:00 AM [name], NP [facility] None    ------------------------------------------------------------------------------------------------------------------   HPI Pulse 92   Temp 98.2 F (36.8 C)   Ht '5\' 4"'$  (1.626 m)   LMP 09/01/2013   SpO2 96%   BMI 29.52 kg/m   62 y.o.female presents for fever, cough and congestion.  Had Covid 12/11/22 and has not felt better since. She had a fever of 102 over the weekend. Covid and flu negative today. She completed course of Dexamethasone. She will have congestion in her head and coughing and get yellow/green blood tinged . Feels very fatigued, sinus pressure.    BP is currently well controlled with Cardizem CD 240 mg and HCTZ 25 mg QD, and Losartan 50 mg QD BP Readings from Last 3 Encounters:  05/29/22  104/78  11/29/21 118/72  09/26/21 127/76    Past Medical History:  Diagnosis Date   Allergy    Anxiety    Arthritis    hip   Cataract    removed bilateral- + lens implants   Fatty liver disease, nonalcoholic [id number]   Via U/S   GERD (gastroesophageal reflux disease)    Hyperlipidemia    Hypertension    Insomnia    Nephrolithiasis    Prediabetes    on metformin   Prolonged QT interval    Thyroid disease    Vitamin D deficiency      Allergies  Allergen Reactions   Atenolol     Beta blockers. Pt doesn't remember there being a problem   Codeine Nausea Only   Klonopin [Clonazepam] Rash   Penicillins Rash    Current Outpatient Medications on File Prior to Visit  Medication Sig   ALPRAZolam (XANAX) 0.5 MG tablet TAKE 1/2 TO 1 TABLET BY MOUTH 2  TO 3 TIMES DAILY IF NEEDED FOR  ANXIETY ATTACK. LIMIT TO 5 DAYS  PER WEEK TO AVOID ADDICTION AND  DEMENTIA   aspirin 81 MG tablet Take 81 mg by mouth daily.   azithromycin (ZITHROMAX) 250 MG tablet Take 2 tablets with Food on  Day 1, then 1 tablet Daily with Food for Sinusitis / Bronchitis   benzonatate (TESSALON) 200 MG capsule Take 1 perle 3 x /  day to prevent cough   cholecalciferol (VITAMIN D) 1000 UNITS tablet Take 10,000 Units by mouth daily.    clotrimazole-betamethasone (LOTRISONE) cream Apply 1 application topically 2 (two) times daily.   dexamethasone (DECADRON) 4 MG tablet Take 1 tab 3 x /day for 2 days,      then 2 x /day for 2  Days,     then 1 tab daily   diltiazem (CARDIZEM CD) 240 MG 24 hr capsule Take  1 capsule  Daily  for BP                                                                                      /                           TAKE                                           BY                                   MOUTH   hydrochlorothiazide (HYDRODIURIL) 25 MG tablet TAKE 1 TABLET BY MOUTH  DAILY FOR BLOOD PRESSURE  AND FLUID RETENTION/ANKLE  SWELLING   losartan (COZAAR) 50 MG tablet TAKE 1 TABLET BY MOUTH  DAILY   metFORMIN (GLUCOPHAGE XR) 500 MG 24 hr tablet Take 1 to 2 tablets 2 x /day  for Diabetes   Multiple Vitamin (MULTIVITAMIN WITH MINERALS) TABS tablet Take 1 tablet by mouth daily.   Omega-3 Fatty Acids (FISH OIL PO) Take 1 capsule by mouth daily.   promethazine-dextromethorphan (PROMETHAZINE-DM) 6.25-15 MG/5ML syrup TAKE 5MLS BY MOUTH 3 TIMES A DAY AS NEEDED   pseudoephedrine (SUDAFED) 120 MG 12 hr tablet Take  1 tablet  2 x /day (every 12 hours)  for Sinus & Chest Congestion   SYNTHROID 200 MCG tablet TAKE 1 TABLET DAILY FOR THYROID   zinc gluconate 50 MG tablet Take 50 mg by mouth daily.   No current facility-administered medications on file prior to visit.    ROS: all negative except above.   Physical Exam:  Pulse 92   Temp 98.2 F (36.8 C)   Ht '5\' 4"'$  (1.626 m)   LMP 09/01/2013   SpO2 96%   BMI 29.52 kg/m   General Appearance: Well nourished, in no apparent distress. Eyes: PERRLA, EOMs, conjunctiva no swelling or erythema Sinuses: No Frontal/maxillary tenderness ENT/Mouth: Ext aud canals clear, TMs bulging and erythematous. No erythema, swelling, or exudate on post pharynx.  Hearing normal.  Neck: Supple, thyroid normal.  Respiratory: Respiratory effort normal, BS equal bilaterally without rales, rhonchi, wheezing or stridor. Dry coughing throughout the visit  Cardio: RRR with no MRGs. Brisk peripheral pulses without edema.  Abdomen: Soft, + BS.  Non tender, no guarding, rebound, hernias, masses. Lymphatics: Non tender without lymphadenopathy.  Musculoskeletal: Full ROM, 5/5 strength, normal gait.  Skin: Warm, dry without rashes, lesions, ecchymosis.  Neuro:  Cranial nerves intact. Normal muscle tone, no cerebellar symptoms. Sensation intact.  Psych: Awake and oriented X 3, normal affect, Insight and Judgment appropriate.     [name], NP 4:27 PM [facility]

## 2023-01-21 ENCOUNTER — Other Ambulatory Visit: Payer: Self-pay | Admitting: Nurse Practitioner

## 2023-01-21 DIAGNOSIS — G4700 Insomnia, unspecified: Secondary | ICD-10-CM

## 2023-02-05 ENCOUNTER — Other Ambulatory Visit: Payer: Self-pay | Admitting: Nurse Practitioner

## 2023-02-05 DIAGNOSIS — G4700 Insomnia, unspecified: Secondary | ICD-10-CM

## 2023-03-08 ENCOUNTER — Other Ambulatory Visit: Payer: Self-pay | Admitting: Nurse Practitioner

## 2023-03-08 DIAGNOSIS — G4700 Insomnia, unspecified: Secondary | ICD-10-CM

## 2023-03-24 ENCOUNTER — Other Ambulatory Visit: Payer: Self-pay | Admitting: Nurse Practitioner

## 2023-03-24 DIAGNOSIS — G4700 Insomnia, unspecified: Secondary | ICD-10-CM

## 2023-04-07 ENCOUNTER — Other Ambulatory Visit: Payer: Self-pay | Admitting: Nurse Practitioner

## 2023-04-07 DIAGNOSIS — G4700 Insomnia, unspecified: Secondary | ICD-10-CM

## 2023-05-21 ENCOUNTER — Other Ambulatory Visit: Payer: Self-pay | Admitting: Nurse Practitioner

## 2023-05-21 DIAGNOSIS — G4700 Insomnia, unspecified: Secondary | ICD-10-CM

## 2023-05-29 NOTE — Progress Notes (Unsigned)
 Complete Physical  Assessment and Plan:  Shannon Pope was seen today for annual exam.  Diagnoses and all orders for this visit:  Encounter for general adult medical examination with abnormal findings Due Yearly Pap done today Mammogram scheduled later this month. Colonoscopy UTD  Abdominal aortic atherosclerosis (HCC) Control blood pressure, blood sugars, cholesterol and weight.  Type 2 diabetes mellitus treated without insulin (HCC) Currently well controlled without medication Continue diet, exercise and weight loss  Hyperlipidemia associated with type 2 diabetes mellitus (HCC) LDL Currently not at goal Continue diet and exercise.  Perform daily foot/skin check, notify office of any concerning changes.  Check A1C   Class 2 severe obesity due to excess calories with serious comorbidity in adult, unspecified BMI (HCC) Continue diet, exercise and focus on weight loss Limiting saturated fat and simple carbs and continue increased activity  Type 2 diabetes mellitus with hyperlipidemia (HCC) LDL Currently not at goal Continue diet and exercise.  Perform daily foot/skin check, notify office of any concerning changes.  -     COMPLETE METABOLIC PANEL WITH GFR -     CBC with Differential/Platelet -     Lipid panel -     Hemoglobin A1c -     Urinalysis, Routine w reflex microscopic -     Microalbumin / creatinine urine ratio  Essential hypertension - continue medications, DASH diet, exercise and monitor at home. Call if greater than 130/80.  Go to the ER if any chest pain, shortness of breath, nausea, dizziness, severe HA, changes vision/speech  -     EKG 12-Lead -     Urinalysis, Routine w reflex microscopic -     Microalbumin / creatinine urine ratio  Hypothyroidism, unspecified type Please take your thyroid medication greater than 30 min before breakfast, separated by at least 4 hours  from antacids, calcium, iron, and multivitamins.  -     CBC with Differential/Platelet -      TSH  Nonalcoholic fatty liver disease - CMP Continue diet, exercise and weight loss, limit Tylenol and alcohol  Vitamin D deficiency Continue Vit D supplementation to maintain value in therapeutic level of 60-100  -     VITAMIN D 25 Hydroxy (Vit-D Deficiency, Fractures)  Other osteoarthritis involving multiple joints Continue to monitor and use Mobic as needed  Medication management -     Magnesium  Screening for ischemic heart disease -     EKG 12-Lead  Screening for hematuria or proteinuria -     Urinalysis, Routine w reflex microscopic -     Microalbumin / creatinine urine ratio  Screening for cervical cancer -     PAP, TP IMAGING, WNL RFLX HPV        Discussed med's effects and SE's. Screening labs and tests as requested with regular follow-up as recommended.  Future Appointments  Date Time Provider Department Center  05/30/2023  9:00 AM Shannon Dick, NP [facility] None  05/29/2024  9:00 AM Shannon Dick, NP [facility] None     HPI 62 y.o. female  presents for a complete physical. has Thyroid disease; Hypertension; Hyperlipidemia; Vitamin D deficiency; Type 2 diabetes mellitus with hyperlipidemia (HCC); Medication management; Bilateral fibrocystic breast disease (FCBD); Nonalcoholic fatty liver disease; History of nephrolithiasis; Insomnia; and Left lower lobe pulmonary nodule on their problem list.   She has had elevated blood pressure since 2000. Her blood pressure has been controlled at home, today their BP is    . She is currently on Diltiazem 240 mg, HCTZ  25mg  , Losartan 50 mg daily.  Denies headache, chest pain, shortness of breath and dizziness BP Readings from Last 3 Encounters:  01/14/23 (!) 148/82  05/29/22 104/78  11/29/21 118/72      BMI is There is no height or weight on file to calculate BMI., she is working on diet and exercise. Could not tolerate phentermine. Ating more fruits and vegetables.  Wt Readings from Last 3 Encounters:   01/14/23 181 lb 9.6 oz (82.4 kg)  05/29/22 172 lb (78 kg)  11/29/21 182 lb 3.2 oz (82.6 kg)   She does workout, walks 5 miles a day. She denies chest pain, shortness of breath, dizziness.   She is not on cholesterol medication and denies myalgias. Her cholesterol is at goal. The cholesterol last visit was:   Lab Results  Component Value Date   CHOL 161 05/29/2022   HDL 49 (L) 05/29/2022   LDLCALC 91 05/29/2022   TRIG 118 05/29/2022   CHOLHDL 3.3 05/29/2022    She has been working on diet and exercise for prediabetes, and denies paresthesia of the feet, polydipsia and polyuria. Last A1C in the office was:  Lab Results  Component Value Date   HGBA1C 5.3 05/29/2022   Lab Results  Component Value Date   EGFR 104 05/29/2022    Patient is on Vitamin D supplement,  5000 every day.    She is on thyroid medication. She is on 200 mcg daily except M-W-F 1/2 tab. Had an allergic reactions to the generic synthroid, her insurance is not covering the brand name. Will send directly to synthroid  Lab Results  Component Value Date   TSH 0.52 07/02/2022  .  Has a history of kidney stones, follows with Dr. Sherron Monday.  Very rare trouble sleeping, takes xanax PRN if this happens.  Sister and mother passed in Nov 18, 2015stroke and sudden death due heart valve disease.  Has had 3 sisters with carotid artery disease, have had stroke, 70's, upper 60's, smokers, she denies symptoms of dizziness, vision changes- explained that we will monitor and control risk factors but no need for further testing at this time. .  Current Medications:  Current Outpatient Medications on File Prior to Visit  Medication Sig   acetaminophen-codeine (TYLENOL/CODEINE #3) 300-30 MG tablet Take 1 tablet by mouth every 6 (six) hours as needed for moderate pain.   ALPRAZolam (XANAX) 0.5 MG tablet TAKE 1/2 TO 1 TABLET BY MOUTH  ONCE DAILY IF NEEDED FOR ANXIETY ATTACK. LIMIT TO 5 DAYS PER WEEK TO AVOID ADDICTION AND DEMENTIA    aspirin 81 MG tablet Take 81 mg by mouth daily.   azithromycin (ZITHROMAX) 250 MG tablet Take 2 tablets with Food on  Day 1, then 1 tablet Daily with Food for Sinusitis / Bronchitis   benzonatate (TESSALON) 200 MG capsule Take 1 perle 3 x / day to prevent cough   cholecalciferol (VITAMIN D) 1000 UNITS tablet Take 10,000 Units by mouth daily.    clotrimazole-betamethasone (LOTRISONE) cream Apply 1 application topically 2 (two) times daily.   diltiazem (CARDIZEM CD) 240 MG 24 hr capsule Take  1 capsule  Daily  for BP                                                                                      /  TAKE                                           BY                                   MOUTH   hydrochlorothiazide (HYDRODIURIL) 25 MG tablet TAKE 1 TABLET BY MOUTH DAILY FOR BLOOD PRESSURE AND FLUID  RETENTION/ANKLE SWELLING   losartan (COZAAR) 50 MG tablet TAKE 1 TABLET BY MOUTH DAILY   metFORMIN (GLUCOPHAGE XR) 500 MG 24 hr tablet Take 1 to 2 tablets 2 x /day  for Diabetes   Multiple Vitamin (MULTIVITAMIN WITH MINERALS) TABS tablet Take 1 tablet by mouth daily.   Omega-3 Fatty Acids (FISH OIL PO) Take 1 capsule by mouth daily.   predniSONE (DELTASONE) 10 MG tablet 3 tabs x 2 , 2 tabs x 2,  1 tab x 3   promethazine-dextromethorphan (PROMETHAZINE-DM) 6.25-15 MG/5ML syrup TAKE BY MOUTH 3 TIMES A DAY AS NEEDED   pseudoephedrine (SUDAFED) 120 MG 12 hr tablet Take  1 tablet  2 x /day (every 12 hours)  for Sinus & Chest Congestion   SYNTHROID 200 MCG tablet TAKE 1 TABLET DAILY FOR THYROID   zinc gluconate 50 MG tablet Take 50 mg by mouth daily.   No current facility-administered medications on file prior to visit.    Health Maintenance:   Immunization History  Administered Date(s) Administered   Pneumococcal-Unspecified 04/10/2010   Td 11/03/2008   Tdap 12/20/2017   Tetanus: 2019 Pneumovax: 2011 Prevnar 13: age 64 Flu vaccine: will get in Nov this year Zostavax:  N/A  Pap: 07/20/2019 due today MGM: 05/2021 negative, scheduled for 06/15/22 DEXA: N/A Colonoscopy:09/2021  Dr. Adela Lank EGD: N/A CXR: DUE CT AB 2014 [place] AB 2014 fatty liver  Medical History:  Past Medical History:  Diagnosis Date   Allergy    Anxiety    Arthritis    hip   Cataract    removed bilateral- + lens implants   Fatty liver disease, nonalcoholic 08/19/2013   Via U/S   GERD (gastroesophageal reflux disease)    Hyperlipidemia    Hypertension    Insomnia    Nephrolithiasis    Prediabetes    on metformin   Prolonged QT interval    Thyroid disease    Vitamin D deficiency    Allergies Allergies  Allergen Reactions   Atenolol     Beta blockers. Pt doesn't remember there being a problem   Codeine Nausea Only   Klonopin [Clonazepam] Rash   Penicillins Rash    SURGICAL HISTORY She  has a past surgical history that includes Cholecystectomy; Tubal ligation; Lithotripsy (2016); Breast biopsy (Left); Colonoscopy; Polypectomy; and Knee arthroscopy (Left). FAMILY HISTORY Her family history includes Heart Problems (age of onset: 72) in her mother; Hypertension in her mother; Peripheral Artery Disease in her sister. SOCIAL HISTORY She  reports that she quit smoking about 29 years ago. Her smoking use included cigarettes. She has a 10.00 pack-year smoking history. She has never used smokeless tobacco. She reports current alcohol use of about 2.0 standard drinks of alcohol per week. She reports that she does not use drugs.  Review of Systems  Constitutional:  Negative for chills, diaphoresis, fever, malaise/fatigue and weight loss.  HENT: Negative.    Eyes: Negative.  Respiratory: Negative.  Negative for cough and shortness of breath.   Cardiovascular: Negative.  Negative for chest pain.  Genitourinary: Negative.  Negative for dysuria, frequency and urgency.  Musculoskeletal:  Negative for back pain, falls, joint pain, myalgias and neck pain.  Skin: Negative.    Neurological: Negative.  Negative for dizziness and weakness.  Psychiatric/Behavioral:  Negative for depression, hallucinations, memory loss, substance abuse and suicidal ideas. The patient has insomnia (occ will take xanax). The patient is not nervous/anxious.     Physical Exam: Estimated body mass index is 31.17 kg/m as calculated from the following:   Height as of 01/14/23: 5\' 4"  (1.626 m).   Weight as of 01/14/23: 181 lb 9.6 oz (82.4 kg). LMP 09/01/2013  General Appearance: Well nourished, in no apparent distress. Eyes: PERRLA, EOMs, conjunctiva no swelling or erythema, normal fundi and vessels. Sinuses: No Frontal/maxillary tenderness ENT/Mouth: Ext aud canals clear, normal light reflex with TMs without erythema, bulging.  Good dentition. No erythema, swelling, or exudate on post pharynx. Tonsils not swollen or erythematous. Hearing normal.  Neck: Supple, thyroid normal. No bruits Respiratory: Respiratory effort normal, BS equal bilaterally without rales, rhonchi, wheezing or stridor. Cardio: RRR without murmurs, rubs or gallops. Brisk peripheral pulses without edema.  Chest: symmetric, with normal excursions and percussion. Breasts: breasts appear normal, no suspicious masses, no skin or nipple changes or axillary nodes. Abdomen: Soft, +BS. Non tender, no guarding, rebound, hernias, masses, or organomegaly.  Lymphatics: Non tender without lymphadenopathy.  Pelvic exam: normal external genitalia, vulva, vagina, cervix, uterus and adnexa, PAP: Pap smear done today, thin-prep method. Musculoskeletal: Full ROM all peripheral extremities,5/5 strength, and normal gait. Skin: Warm, dry without rashes, lesions, ecchymosis.  Neuro: Cranial nerves intact, reflexes equal bilaterally. Normal muscle tone, no cerebellar symptoms. Sensation intact.  Psych: Awake and oriented X 3, normal affect, Insight and Judgment appropriate.   EKG: NSR, No ST changes AORTA SCAN:  defer   Emeric Novinger E   12:14 PM

## 2023-05-30 ENCOUNTER — Ambulatory Visit (INDEPENDENT_AMBULATORY_CARE_PROVIDER_SITE_OTHER): Payer: 59 | Admitting: Nurse Practitioner

## 2023-05-30 ENCOUNTER — Encounter: Payer: Self-pay | Admitting: Nurse Practitioner

## 2023-05-30 VITALS — BP 132/72 | HR 65 | Temp 97.3°F | Ht 63.5 in | Wt 183.0 lb

## 2023-05-30 DIAGNOSIS — Z136 Encounter for screening for cardiovascular disorders: Secondary | ICD-10-CM | POA: Diagnosis not present

## 2023-05-30 DIAGNOSIS — Z0000 Encounter for general adult medical examination without abnormal findings: Secondary | ICD-10-CM

## 2023-05-30 DIAGNOSIS — E1169 Type 2 diabetes mellitus with other specified complication: Secondary | ICD-10-CM

## 2023-05-30 DIAGNOSIS — E119 Type 2 diabetes mellitus without complications: Secondary | ICD-10-CM

## 2023-05-30 DIAGNOSIS — E559 Vitamin D deficiency, unspecified: Secondary | ICD-10-CM

## 2023-05-30 DIAGNOSIS — I700 Atherosclerosis of aorta: Secondary | ICD-10-CM | POA: Diagnosis not present

## 2023-05-30 DIAGNOSIS — E079 Disorder of thyroid, unspecified: Secondary | ICD-10-CM

## 2023-05-30 DIAGNOSIS — G4700 Insomnia, unspecified: Secondary | ICD-10-CM

## 2023-05-30 DIAGNOSIS — I10 Essential (primary) hypertension: Secondary | ICD-10-CM

## 2023-05-30 DIAGNOSIS — E039 Hypothyroidism, unspecified: Secondary | ICD-10-CM

## 2023-05-30 DIAGNOSIS — K760 Fatty (change of) liver, not elsewhere classified: Secondary | ICD-10-CM

## 2023-05-30 DIAGNOSIS — Z1389 Encounter for screening for other disorder: Secondary | ICD-10-CM

## 2023-05-30 DIAGNOSIS — E785 Hyperlipidemia, unspecified: Secondary | ICD-10-CM

## 2023-05-30 DIAGNOSIS — M158 Other polyosteoarthritis: Secondary | ICD-10-CM

## 2023-05-30 DIAGNOSIS — Z79899 Other long term (current) drug therapy: Secondary | ICD-10-CM

## 2023-05-30 DIAGNOSIS — Z0001 Encounter for general adult medical examination with abnormal findings: Secondary | ICD-10-CM

## 2023-05-30 MED ORDER — DILTIAZEM HCL ER COATED BEADS 240 MG PO CP24
ORAL_CAPSULE | ORAL | 3 refills | Status: DC
Start: 2023-05-30 — End: 2023-10-14

## 2023-05-30 MED ORDER — SYNTHROID 200 MCG PO TABS
ORAL_TABLET | ORAL | Status: DC
Start: 2023-05-30 — End: 2023-08-02

## 2023-05-30 MED ORDER — ALPRAZOLAM 0.5 MG PO TABS
ORAL_TABLET | ORAL | 0 refills | Status: DC
Start: 2023-05-30 — End: 2023-08-12

## 2023-05-30 NOTE — Patient Instructions (Signed)

## 2023-05-31 LAB — CBC WITH DIFFERENTIAL/PLATELET
Absolute Monocytes: 470 cells/uL (ref 200–950)
Basophils Absolute: 81 cells/uL (ref 0–200)
Basophils Relative: 1.4 %
Eosinophils Absolute: 232 cells/uL (ref 15–500)
Eosinophils Relative: 4 %
HCT: 36.9 % (ref 35.0–45.0)
Hemoglobin: 12.2 g/dL (ref 11.7–15.5)
Lymphs Abs: 1653 cells/uL (ref 850–3900)
MCH: 28.4 pg (ref 27.0–33.0)
MCHC: 33.1 g/dL (ref 32.0–36.0)
MCV: 85.8 fL (ref 80.0–100.0)
MPV: 10.6 fL (ref 7.5–12.5)
Monocytes Relative: 8.1 %
Neutro Abs: 3364 cells/uL (ref 1500–7800)
Neutrophils Relative %: 58 %
Platelets: 228 10*3/uL (ref 140–400)
RBC: 4.3 10*6/uL (ref 3.80–5.10)
RDW: 13.8 % (ref 11.0–15.0)
Total Lymphocyte: 28.5 %
WBC: 5.8 10*3/uL (ref 3.8–10.8)

## 2023-05-31 LAB — VITAMIN D 25 HYDROXY (VIT D DEFICIENCY, FRACTURES): Vit D, 25-Hydroxy: 85 ng/mL (ref 30–100)

## 2023-05-31 LAB — URINALYSIS, ROUTINE W REFLEX MICROSCOPIC
Bacteria, UA: NONE SEEN /HPF
Bilirubin Urine: NEGATIVE
Glucose, UA: NEGATIVE
Hgb urine dipstick: NEGATIVE
Ketones, ur: NEGATIVE
Nitrite: NEGATIVE
Protein, ur: NEGATIVE
RBC / HPF: NONE SEEN /HPF (ref 0–2)
Specific Gravity, Urine: 1.004 (ref 1.001–1.035)
pH: 7 (ref 5.0–8.0)

## 2023-05-31 LAB — COMPLETE METABOLIC PANEL WITH GFR
AG Ratio: 1.7 (calc) (ref 1.0–2.5)
ALT: 22 U/L (ref 6–29)
AST: 31 U/L (ref 10–35)
Albumin: 4.6 g/dL (ref 3.6–5.1)
Alkaline phosphatase (APISO): 120 U/L (ref 37–153)
BUN: 11 mg/dL (ref 7–25)
CO2: 28 mmol/L (ref 20–32)
Calcium: 10.1 mg/dL (ref 8.6–10.4)
Chloride: 102 mmol/L (ref 98–110)
Creat: 0.54 mg/dL (ref 0.50–1.05)
Globulin: 2.7 g/dL (calc) (ref 1.9–3.7)
Glucose, Bld: 104 mg/dL — ABNORMAL HIGH (ref 65–99)
Potassium: 4.4 mmol/L (ref 3.5–5.3)
Sodium: 141 mmol/L (ref 135–146)
Total Bilirubin: 0.3 mg/dL (ref 0.2–1.2)
Total Protein: 7.3 g/dL (ref 6.1–8.1)
eGFR: 103 mL/min/{1.73_m2} (ref >=60)

## 2023-05-31 LAB — MICROALBUMIN / CREATININE URINE RATIO
Creatinine, Urine: 24 mg/dL (ref 20–275)
Microalb Creat Ratio: 29 mg/g creat (ref <30)
Microalb, Ur: 0.7 mg/dL

## 2023-05-31 LAB — MICROSCOPIC MESSAGE

## 2023-05-31 LAB — LIPID PANEL
Cholesterol: 207 mg/dL — ABNORMAL HIGH (ref <200)
HDL: 54 mg/dL (ref >=50)
LDL Cholesterol (Calc): 127 mg/dL (calc) — ABNORMAL HIGH
Non-HDL Cholesterol (Calc): 153 mg/dL (calc) — ABNORMAL HIGH (ref <130)
Total CHOL/HDL Ratio: 3.8 (calc) (ref <5.0)
Triglycerides: 149 mg/dL (ref <150)

## 2023-05-31 LAB — HEMOGLOBIN A1C W/OUT EAG: Hgb A1c MFr Bld: 5.7 % of total Hgb — ABNORMAL HIGH (ref <5.7)

## 2023-05-31 LAB — TSH: TSH: 3.52 mIU/L (ref 0.40–4.50)

## 2023-05-31 LAB — MAGNESIUM: Magnesium: 2 mg/dL (ref 1.5–2.5)

## 2023-06-05 ENCOUNTER — Other Ambulatory Visit: Payer: Self-pay | Admitting: Internal Medicine

## 2023-06-05 DIAGNOSIS — E1169 Type 2 diabetes mellitus with other specified complication: Secondary | ICD-10-CM

## 2023-06-13 ENCOUNTER — Encounter: Payer: Self-pay | Admitting: Nurse Practitioner

## 2023-06-17 NOTE — Telephone Encounter (Signed)
Please fill out form I put on your desk and I will sign.  Call to see if she wants to pick up or have mailed to her

## 2023-07-02 ENCOUNTER — Other Ambulatory Visit: Payer: Self-pay | Admitting: Nurse Practitioner

## 2023-07-02 DIAGNOSIS — E1169 Type 2 diabetes mellitus with other specified complication: Secondary | ICD-10-CM

## 2023-07-29 ENCOUNTER — Other Ambulatory Visit: Payer: Self-pay | Admitting: Nurse Practitioner

## 2023-07-29 ENCOUNTER — Ambulatory Visit
Admission: RE | Admit: 2023-07-29 | Discharge: 2023-07-29 | Disposition: A | Discharge status: Home / Self Care | Payer: 59 | Source: Ambulatory Visit | Attending: Nurse Practitioner

## 2023-07-29 DIAGNOSIS — Z1231 Encounter for screening mammogram for malignant neoplasm of breast: Secondary | ICD-10-CM

## 2023-08-02 ENCOUNTER — Encounter: Payer: Self-pay | Admitting: Nurse Practitioner

## 2023-08-02 ENCOUNTER — Other Ambulatory Visit: Payer: Self-pay | Admitting: Nurse Practitioner

## 2023-08-02 DIAGNOSIS — E079 Disorder of thyroid, unspecified: Secondary | ICD-10-CM

## 2023-08-02 MED ORDER — SYNTHROID 200 MCG PO TABS
ORAL_TABLET | ORAL | Status: DC
Start: 2023-08-02 — End: 2023-08-05

## 2023-08-05 ENCOUNTER — Other Ambulatory Visit: Payer: Self-pay | Admitting: Nurse Practitioner

## 2023-08-05 DIAGNOSIS — E079 Disorder of thyroid, unspecified: Secondary | ICD-10-CM

## 2023-08-05 MED ORDER — SYNTHROID 200 MCG PO TABS: ORAL_TABLET | ORAL | 1 refills | Status: DC

## 2023-08-05 NOTE — Telephone Encounter (Signed)
Here's the fax

## 2023-08-05 NOTE — Telephone Encounter (Signed)
I dont know how to do this other than do a written script- please see if she can get a fax number

## 2023-08-11 ENCOUNTER — Other Ambulatory Visit: Payer: Self-pay | Admitting: Nurse Practitioner

## 2023-08-11 DIAGNOSIS — G4700 Insomnia, unspecified: Secondary | ICD-10-CM

## 2023-08-29 ENCOUNTER — Encounter: Payer: Self-pay | Admitting: Nurse Practitioner

## 2023-08-30 ENCOUNTER — Other Ambulatory Visit: Payer: Self-pay | Admitting: Nurse Practitioner

## 2023-08-30 DIAGNOSIS — B372 Candidiasis of skin and nail: Secondary | ICD-10-CM

## 2023-08-30 MED ORDER — CLOTRIMAZOLE-BETAMETHASONE 1-0.05 % EX CREA: 1.0000 | TOPICAL_CREAM | Freq: Two times a day (BID) | CUTANEOUS | 2 refills | Status: DC

## 2023-09-02 NOTE — Progress Notes (Deleted)
3 MONTH FOLLOW UP  Assessment and Plan:  Shannon Pope was seen today for follow-up.  Diagnoses and all orders for this visit:  Type 2 diabetes mellitus with hyperlipidemia (HCC)  Continue metformin, Diet and exercise.  Abdominal aortic atherosclerosis (HCC)  Control blood pressure, cholesterol and weight  Type 2 diabetes mellitus treated without insulin (HCC) Continue medications: Continue diet and exercise.  Perform daily foot/skin check, notify office of any concerning changes.  Check A1C  -     Hemoglobin A1c   Hyperlipidemia associated with Type 2 DM(HCC) Continue diet and exercise     - CBC with Differential/Platelet -     COMPLETE METABOLIC PANEL WITH GFR -     Lipid panel - Continue diet and exercise  Essential hypertension -     CBC with Differential/Platelet -     Microalbumin / creatinine urine ratio  - continue medications, DASH diet, exercise and monitor at home. Call if greater than 130/80.    Class 2 drug-induced obesity with serious comorbidity and body mass index (BMI) of 34- 34.9  Pt has lost 15 pounds in last 2 months  Continue diet and exercise  Hypothyroidism, unspecified type -     TSH - Continue current dose of Synthroid(requires brand name due to allergic reaction with generic), will adjust pending lab results Please take your thyroid medication greater than 30 min before breakfast, separated by at least 4 hours  from antacids, calcium, iron, and multivitamins.    Osteoarthritis Begin Meloxicam as needed Continue exercise, diet and weight loss  Continue diet and meds as discussed. Further disposition pending results of labs. Discussed med's effects and SE's.   Over 30 minutes of face to face interview, exam, counseling, chart review, and critical decision making was performed.  Future Appointments  Date Time Provider Department Center  09/03/2023  3:30 PM Raynelle Dick, NP [facility] None  05/29/2024  9:00 AM Raynelle Dick, NP [facility]  None     HPI 62 y.o. female  presents for a complete physical.  She has had elevated blood pressure since 2000. Her blood pressure has been controlled at home, today their BP is     BP Readings from Last 3 Encounters:  05/30/23 132/72  01/14/23 (!) 148/82  05/29/22 104/78    BMI is There is no height or weight on file to calculate BMI., she is working on diet and exercise. Could not tolerate phentermine.  Wt Readings from Last 3 Encounters:  05/30/23 183 lb (83 kg)  01/14/23 181 lb 9.6 oz (82.4 kg)  05/29/22 172 lb (78 kg)   She does workout, walks 5 miles a day. She denies chest pain, shortness of breath, dizziness.   She is not on cholesterol medication and denies myalgias. Her cholesterol is at goal. Pt has been eating less fat and getting more exercise. The cholesterol last visit was:   Lab Results  Component Value Date   CHOL 207 (H) 05/30/2023   HDL 54 05/30/2023   LDLCALC 127 (H) 05/30/2023   TRIG 149 05/30/2023   CHOLHDL 3.8 05/30/2023    She has been working on diet and exercise for prediabetes, and denies paresthesia of the feet, polydipsia and polyuria. Currently taking Metformin 1 tab 4 times a day. Last A1C in the office was:  Lab Results  Component Value Date   HGBA1C 5.7 (H) 05/30/2023   Lab Results  Component Value Date   GFRNONAA 101 05/18/2021   Patient is on Vitamin D supplement,  5000 every day.    She is on thyroid medication. Her medication was changed last visit, she is on 200 mcg daily, Wednesday taking 1/2 tab. Had an allergic reactions to the generic synthroid, her insurance is not covering the brand name. Will send directly to synthroid.  Lab Results  Component Value Date   TSH 3.52 05/30/2023  .  Has a history of kidney stones, follows with Dr. Sherron Monday.  Very rare trouble sleeping, takes xanax PRN if this happens.  Sister and mother passed in 2015/11/21stroke and sudden death due heart valve disease.  Has had 3 sisters with carotid artery  disease, have had stroke, 70's, upper 60's, smokers, she denies symptoms of dizziness, vision changes- explained that we will monitor and control risk factors but no need for further testing at this time. . Coronary Calcium Score 10/2019 was 0.  Current Medications:  Current Outpatient Medications on File Prior to Visit  Medication Sig   acetaminophen-codeine (TYLENOL/CODEINE #3) 300-30 MG tablet Take 1 tablet by mouth every 6 (six) hours as needed for moderate pain.   ALPRAZolam (XANAX) 0.5 MG tablet TAKE 1/2 TO 1 TABLET BY MOUTH  ONCE DAILY IF NEEDED FOR ANXIETY ATTACK. LIMIT TO 5 DAYS PER WEEK TO AVOID ADDICTION AND DEMENTIA   aspirin 81 MG tablet Take 81 mg by mouth daily.   cholecalciferol (VITAMIN D) 1000 UNITS tablet Take 10,000 Units by mouth daily.    clotrimazole-betamethasone (LOTRISONE) cream Apply 1 Application topically 2 (two) times daily.   diltiazem (CARDIZEM CD) 240 MG 24 hr capsule Take  1 capsule  Daily  for BP                                                                                      /                           TAKE                                           BY                                   MOUTH   hydrochlorothiazide (HYDRODIURIL) 25 MG tablet TAKE 1 TABLET BY MOUTH DAILY FOR BLOOD PRESSURE AND FLUID  RETENTION/ANKLE SWELLING   ibuprofen (ADVIL) 800 MG tablet TAKE 1 TABLET BY MOUTH THREE TIMES DAILY AS NEEDED FOR 10 DAYS   losartan (COZAAR) 50 MG tablet TAKE 1 TABLET BY MOUTH DAILY   metFORMIN (GLUCOPHAGE-XR) 500 MG 24 hr tablet TAKE 1 TO 2 TABLETS BY MOUTH TWICE DAILY FOR DIABETES   Multiple Vitamin (MULTIVITAMIN WITH MINERALS) TABS tablet Take 1 tablet by mouth daily.   Omega-3 Fatty Acids (FISH OIL PO) Take 1 capsule by mouth daily.   SYNTHROID 200 MCG tablet TAKE 1/2 TABLET DAILY FOR THYROID   zinc gluconate 50 MG tablet Take 50 mg by mouth daily.   No  current facility-administered medications on file prior to visit.    Health Maintenance:   Immunization  History  Administered Date(s) Administered   Pneumococcal-Unspecified 04/10/2010   Td 11/03/2008   Tdap 12/20/2017   Tetanus: 2019 Pneumovax: 2011 Prevnar 13: age 57 Flu vaccine: will get in Nov this year Zostavax: N/A  Pap: 06/2019 normal needs repeat 2023 MGM: 3D mammogram 08/20/2020 benign to follow up 1 year, pt calling to schedule DEXA: N/A Colonoscopy:12/ 2016 Dr. Arlyce Dice Dr. Adela Lank removed 1 polyp , pt to schedule next colonoscopy  EGD: N/A CXR: DUE CT AB 2014 [place] AB 2014 fatty liver Catarracts removed bilaterally 2022  Medical History:  Past Medical History:  Diagnosis Date   Allergy    Anxiety    Arthritis    hip   Cataract    removed bilateral- + lens implants   Fatty liver disease, nonalcoholic 08/19/2013   Via U/S   GERD (gastroesophageal reflux disease)    Hyperlipidemia    Hypertension    Insomnia    Nephrolithiasis    Prediabetes    on metformin   Prolonged QT interval    Thyroid disease    Vitamin D deficiency    Allergies Allergies  Allergen Reactions   Atenolol     Beta blockers. Pt doesn't remember there being a problem   Codeine Nausea Only   Klonopin [Clonazepam] Rash   Penicillins Rash    SURGICAL HISTORY She  has a past surgical history that includes Cholecystectomy; Tubal ligation; Lithotripsy (2016); Breast biopsy (Left, 06/2022); Colonoscopy; Polypectomy; and Knee arthroscopy (Left). FAMILY HISTORY Her family history includes Heart Problems (age of onset: 97) in her mother; Hypertension in her mother; Peripheral Artery Disease in her sister. SOCIAL HISTORY She  reports that she quit smoking about 29 years ago. Her smoking use included cigarettes. She started smoking about 39 years ago. She has a 10 pack-year smoking history. She has never used smokeless tobacco. She reports current alcohol use of about 2.0 standard drinks of alcohol per week. She reports that she does not use drugs.  Review of Systems  Constitutional:  Negative.  Negative for chills, fever and weight loss.  HENT: Negative.  Negative for congestion and hearing loss.   Eyes: Negative.  Negative for blurred vision and double vision.  Respiratory:  Negative for cough, hemoptysis, shortness of breath and wheezing.   Cardiovascular:  Negative for chest pain, palpitations, orthopnea and leg swelling.  Gastrointestinal:  Negative for abdominal pain, constipation, diarrhea, heartburn, nausea and vomiting.  Genitourinary:  Negative for dysuria, frequency and urgency.  Musculoskeletal:  Negative for back pain, falls, joint pain, myalgias and neck pain.  Skin: Negative.  Negative for rash.  Neurological:  Negative for dizziness, tingling, tremors, loss of consciousness, weakness and headaches.  Endo/Heme/Allergies:  Does not bruise/bleed easily.  Psychiatric/Behavioral:  Negative for depression, memory loss and suicidal ideas. The patient has insomnia (1/2 xanax 0.5 mg prn).      Physical Exam: Estimated body mass index is 31.91 kg/m as calculated from the following:   Height as of 05/30/23: 5' 3.5" (1.613 m).   Weight as of 05/30/23: 183 lb (83 kg). LMP 09/01/2013  General Appearance: Well nourished, in no apparent distress. Eyes: PERRLA, EOMs, conjunctiva no swelling or erythema, normal fundi and vessels. Sinuses: No Frontal/maxillary tenderness ENT/Mouth: Ext aud canals clear, normal light reflex with TMs without erythema, bulging.  Good dentition. No erythema, swelling, or exudate on post pharynx. Tonsils not swollen or erythematous. Hearing normal.  Neck: Supple, thyroid normal. No bruits Respiratory: Respiratory effort normal, BS equal bilaterally without rales, rhonchi, wheezing or stridor. Cardio: RRR without murmurs, rubs or gallops. Brisk peripheral pulses without edema.  Chest: symmetric, with normal excursions and percussion. Abdomen: Soft, +BS. Non tender, no guarding, rebound, hernias, masses, or organomegaly.  Lymphatics: Non tender  without lymphadenopathy.  Musculoskeletal: Full ROM all peripheral extremities,5/5 strength, and normal gait. Skin: Warm, dry without rashes, lesions, ecchymosis.  Neuro: Cranial nerves intact, reflexes equal bilaterally. Normal muscle tone, no cerebellar symptoms. Sensation intact.  Psych: Awake and oriented X 3, normal affect, Insight and Judgment appropriate.     Revonda Humphrey ANP-C  [facility]  09/02/2023

## 2023-09-03 ENCOUNTER — Ambulatory Visit: Payer: 59 | Admitting: Nurse Practitioner

## 2023-09-03 DIAGNOSIS — Z79899 Other long term (current) drug therapy: Secondary | ICD-10-CM

## 2023-09-03 DIAGNOSIS — E039 Hypothyroidism, unspecified: Secondary | ICD-10-CM

## 2023-09-03 DIAGNOSIS — E1169 Type 2 diabetes mellitus with other specified complication: Secondary | ICD-10-CM

## 2023-09-03 DIAGNOSIS — E119 Type 2 diabetes mellitus without complications: Secondary | ICD-10-CM

## 2023-09-03 DIAGNOSIS — I700 Atherosclerosis of aorta: Secondary | ICD-10-CM

## 2023-09-03 DIAGNOSIS — K760 Fatty (change of) liver, not elsewhere classified: Secondary | ICD-10-CM

## 2023-09-03 DIAGNOSIS — E559 Vitamin D deficiency, unspecified: Secondary | ICD-10-CM

## 2023-09-04 ENCOUNTER — Other Ambulatory Visit: Payer: Self-pay | Admitting: Nurse Practitioner

## 2023-09-04 DIAGNOSIS — E1169 Type 2 diabetes mellitus with other specified complication: Secondary | ICD-10-CM

## 2023-09-09 ENCOUNTER — Encounter: Payer: Self-pay | Admitting: Nurse Practitioner

## 2023-09-09 ENCOUNTER — Ambulatory Visit (INDEPENDENT_AMBULATORY_CARE_PROVIDER_SITE_OTHER): Payer: 59 | Admitting: Nurse Practitioner

## 2023-09-09 VITALS — BP 132/68 | HR 75 | Temp 97.5°F | Ht 63.5 in | Wt 174.6 lb

## 2023-09-09 DIAGNOSIS — E669 Obesity, unspecified: Secondary | ICD-10-CM

## 2023-09-09 DIAGNOSIS — I10 Essential (primary) hypertension: Secondary | ICD-10-CM

## 2023-09-09 DIAGNOSIS — E119 Type 2 diabetes mellitus without complications: Secondary | ICD-10-CM | POA: Diagnosis not present

## 2023-09-09 DIAGNOSIS — I700 Atherosclerosis of aorta: Secondary | ICD-10-CM

## 2023-09-09 DIAGNOSIS — E039 Hypothyroidism, unspecified: Secondary | ICD-10-CM

## 2023-09-09 DIAGNOSIS — Z79899 Other long term (current) drug therapy: Secondary | ICD-10-CM

## 2023-09-09 DIAGNOSIS — E785 Hyperlipidemia, unspecified: Secondary | ICD-10-CM

## 2023-09-09 DIAGNOSIS — M158 Other polyosteoarthritis: Secondary | ICD-10-CM

## 2023-09-09 DIAGNOSIS — E1169 Type 2 diabetes mellitus with other specified complication: Secondary | ICD-10-CM | POA: Diagnosis not present

## 2023-09-09 NOTE — Patient Instructions (Signed)

## 2023-09-09 NOTE — Progress Notes (Signed)
3 MONTH FOLLOW UP  Assessment and Plan:  Shannon Pope was seen today for follow-up.  Diagnoses and all orders for this visit:  Type 2 diabetes mellitus with hyperlipidemia (HCC)  Continue metformin, Diet and exercise.  Thoracic aortic atherosclerosis (HCC)  Control blood pressure, cholesterol and weight  Type 2 diabetes mellitus treated without insulin (HCC) Continue medications: Metformin 500 mg 2 tabs BID  Continue diet and exercise.  Perform daily foot/skin check, notify office of any concerning changes.  Check A1C  -     Hemoglobin A1c   Hyperlipidemia associated with Type 2 DM(HCC) Continue diet and exercise     - CBC with Differential/Platelet -     COMPLETE METABOLIC PANEL WITH GFR -     Lipid panel - Continue diet and exercise  Essential hypertension -     CBC with Differential/Platelet -     Microalbumin / creatinine urine ratio  - continue medications, DASH diet, exercise and monitor at home. Call if greater than 130/80.    Type 2 Diabetes Mellitus with Obesity(HCC)  Pt has lost 15 pounds in last 2 months  Continue diet and exercise  Hypothyroidism, unspecified type -     TSH - Continue current dose of Synthroid(requires brand name due to allergic reaction with generic), will adjust pending lab results Please take your thyroid medication greater than 30 min before breakfast, separated by at least 4 hours  from antacids, calcium, iron, and multivitamins.    Osteoarthritis Continue Meloxicam as needed Continue exercise, diet and weight loss  Continue diet and meds as discussed. Further disposition pending results of labs. Discussed med's effects and SE's.   Over 30 minutes of face to face interview, exam, counseling, chart review, and critical decision making was performed.  Future Appointments  Date Time Provider Department Center  05/29/2024  9:00 AM Raynelle Dick, NP [facility] None     HPI 62 y.o. female  presents for a follow up visit.   She has had  elevated blood pressure since 2000. Her blood pressure has been controlled at home on diltiazem 240 mg every day , Losartan 50 mg every day and hydrochlorothiazide  25 mg  , today their BP is BP: 132/68   BP Readings from Last 3 Encounters:  09/09/23 132/68  05/30/23 132/72  01/14/23 (!) 148/82    BMI is Body mass index is 30.44 kg/m., she is working on diet and exercise. She has lost 9 pounds in past 3 months.  Wt Readings from Last 3 Encounters:  09/09/23 174 lb 9.6 oz (79.2 kg)  05/30/23 183 lb (83 kg)  01/14/23 181 lb 9.6 oz (82.4 kg)  She does workout, walks 4 miles a day. She denies chest pain, shortness of breath, dizziness.   Sister and mother passed in 12-02-15stroke and sudden death due heart valve disease.  Has had 3 sisters with carotid artery disease, have had stroke, 70's, upper 60's, smokers, she denies symptoms of dizziness, vision changes- explained that we will monitor and control risk factors but no need for further testing at this time. . Coronary Calcium Score 10/2019 was 0. She is not on cholesterol medication and denies myalgias. Her cholesterol is at goal. Pt has been eating less fat and getting more exercise. The cholesterol last visit was:   Lab Results  Component Value Date   CHOL 207 (H) 05/30/2023   HDL 54 05/30/2023   LDLCALC 127 (H) 05/30/2023   TRIG 149 05/30/2023   CHOLHDL 3.8 05/30/2023  She has been working on diet and exercise for prediabetes, and denies paresthesia of the feet, polydipsia and polyuria. Currently taking Metformin 500 mg 2 tabs in am and PM. Last A1C in the office was:  Lab Results  Component Value Date   HGBA1C 5.7 (H) 05/30/2023   Lab Results  Component Value Date   EGFR 103 05/30/2023    Patient is on Vitamin D supplement,  5000 every day.   Lab Results  Component Value Date   VD25OH 7 05/30/2023      She is on thyroid medication. Her medication was changed last visit, she is on 200 mcg 1/2 tab daily.  Had an allergic  reactions to the generic synthroid, her insurance is not covering the brand name. Will send directly to synthroid.  Lab Results  Component Value Date   TSH 3.52 05/30/2023  .  Has a history of kidney stones, follows with Dr. Sherron Monday.  Very rare trouble sleeping, takes xanax PRN if this happens. Last filled Alprazolam 0.5 mg #30 on 08/13/23.    Current Medications:  Current Outpatient Medications on File Prior to Visit  Medication Sig   ALPRAZolam (XANAX) 0.5 MG tablet TAKE 1/2 TO 1 TABLET BY MOUTH  ONCE DAILY IF NEEDED FOR ANXIETY ATTACK. LIMIT TO 5 DAYS PER WEEK TO AVOID ADDICTION AND DEMENTIA   aspirin 81 MG tablet Take 81 mg by mouth daily.   cholecalciferol (VITAMIN D) 1000 UNITS tablet Take 10,000 Units by mouth daily.    clotrimazole-betamethasone (LOTRISONE) cream Apply 1 Application topically 2 (two) times daily.   diltiazem (CARDIZEM CD) 240 MG 24 hr capsule Take  1 capsule  Daily  for BP                                                                                      /                           TAKE                                           BY                                   MOUTH   hydrochlorothiazide (HYDRODIURIL) 25 MG tablet TAKE 1 TABLET BY MOUTH DAILY FOR BLOOD PRESSURE AND FLUID  RETENTION/ANKLE SWELLING   ibuprofen (ADVIL) 800 MG tablet TAKE 1 TABLET BY MOUTH THREE TIMES DAILY AS NEEDED FOR 10 DAYS   losartan (COZAAR) 50 MG tablet TAKE 1 TABLET BY MOUTH DAILY   meloxicam (MOBIC) 7.5 MG tablet Take 1 tablet every day by oral route as needed for 30 days.   metFORMIN (GLUCOPHAGE-XR) 500 MG 24 hr tablet TAKE 1 TO 2 TABLETS BY MOUTH TWICE DAILY FOR DIABETES   Multiple Vitamin (MULTIVITAMIN WITH MINERALS) TABS tablet Take 1 tablet by mouth daily.   Omega-3 Fatty Acids (FISH OIL PO) Take 1 capsule by  mouth daily.   SYNTHROID 200 MCG tablet TAKE 1/2 TABLET DAILY FOR THYROID   zinc gluconate 50 MG tablet Take 50 mg by mouth daily.   acetaminophen-codeine (TYLENOL/CODEINE  #3) 300-30 MG tablet Take 1 tablet by mouth every 6 (six) hours as needed for moderate pain. (Patient not taking: Reported on 09/09/2023)   No current facility-administered medications on file prior to visit.    Health Maintenance:   Immunization History  Administered Date(s) Administered   Pneumococcal-Unspecified 04/10/2010   Td 11/03/2008   Tdap 12/20/2017   Tetanus: 2019 Pneumovax: 2011 Prevnar 13: age 37 Flu vaccine: will get in Nov this year Zostavax: N/A  Pap: 06/2019 normal needs repeat 2023 MGM: 3D mammogram 08/20/2020 benign to follow up 1 year, pt calling to schedule DEXA: N/A Colonoscopy:12/ 2016 Dr. Arlyce Dice Dr. Adela Lank removed 1 polyp , pt to schedule next colonoscopy  EGD: N/A CXR: DUE CT AB 2014 [place] AB 2014 fatty liver Catarracts removed bilaterally 2022  Medical History:  Past Medical History:  Diagnosis Date   Allergy    Anxiety    Arthritis    hip   Cataract    removed bilateral- + lens implants   Fatty liver disease, nonalcoholic 08/19/2013   Via U/S   GERD (gastroesophageal reflux disease)    Hyperlipidemia    Hypertension    Insomnia    Nephrolithiasis    Prediabetes    on metformin   Prolonged QT interval    Thyroid disease    Vitamin D deficiency    Allergies Allergies  Allergen Reactions   Atenolol     Beta blockers. Pt doesn't remember there being a problem   Codeine Nausea Only   Klonopin [Clonazepam] Rash   Penicillins Rash    SURGICAL HISTORY She  has a past surgical history that includes Cholecystectomy; Tubal ligation; Lithotripsy (2016); Breast biopsy (Left, 06/2022); Colonoscopy; Polypectomy; and Knee arthroscopy (Left). FAMILY HISTORY Her family history includes Heart Problems (age of onset: 35) in her mother; Hypertension in her mother; Peripheral Artery Disease in her sister. SOCIAL HISTORY She  reports that she quit smoking about 29 years ago. Her smoking use included cigarettes. She started smoking about 39 years  ago. She has a 10 pack-year smoking history. She has never used smokeless tobacco. She reports current alcohol use of about 2.0 standard drinks of alcohol per week. She reports that she does not use drugs.  Review of Systems  Constitutional: Negative.  Negative for chills, fever and weight loss.  HENT: Negative.  Negative for congestion and hearing loss.   Eyes: Negative.  Negative for blurred vision and double vision.  Respiratory:  Negative for cough, hemoptysis, shortness of breath and wheezing.   Cardiovascular:  Negative for chest pain, palpitations, orthopnea and leg swelling.  Gastrointestinal:  Negative for abdominal pain, constipation, diarrhea, heartburn, nausea and vomiting.  Genitourinary:  Negative for dysuria, frequency and urgency.  Musculoskeletal:  Positive for joint pain (right knee). Negative for back pain, falls, myalgias and neck pain.  Skin: Negative.  Negative for rash.  Neurological:  Negative for dizziness, tingling, tremors, loss of consciousness, weakness and headaches.  Endo/Heme/Allergies:  Does not bruise/bleed easily.  Psychiatric/Behavioral:  Negative for depression, memory loss and suicidal ideas. The patient has insomnia (1/2 xanax 0.5 mg prn).      Physical Exam: Estimated body mass index is 30.44 kg/m as calculated from the following:   Height as of this encounter: 5' 3.5" (1.613 m).   Weight as of this  encounter: 174 lb 9.6 oz (79.2 kg). BP 132/68   Pulse 75   Temp (!) 97.5 F (36.4 C)   Ht 5' 3.5" (1.613 m)   Wt 174 lb 9.6 oz (79.2 kg)   LMP 09/01/2013   SpO2 96%   BMI 30.44 kg/m  General Appearance: Well nourished, in no apparent distress. Eyes: PERRLA, EOMs, conjunctiva no swelling or erythema Sinuses: No Frontal/maxillary tenderness ENT/Mouth: Ext aud canals clear, normal light reflex with TMs without erythema, bulging.  Good dentition. No erythema, swelling, or exudate on post pharynx. Tonsils not swollen or erythematous. Hearing normal.   Neck: Supple, thyroid normal. No bruits Respiratory: Respiratory effort normal, BS equal bilaterally without rales, rhonchi, wheezing or stridor. Cardio: RRR without murmurs, rubs or gallops. Brisk peripheral pulses without edema.  Chest: symmetric, with normal excursions and percussion. Abdomen: Soft, +BS. Non tender, no guarding, rebound, hernias, masses, or organomegaly.  Lymphatics: Non tender without lymphadenopathy.  Musculoskeletal: Full ROM all peripheral extremities,5/5 strength, and normal gait. Skin: Warm, dry without rashes, lesions, ecchymosis.  Neuro: Cranial nerves intact, reflexes equal bilaterally. Normal muscle tone, no cerebellar symptoms. Sensation intact.  Psych: Awake and oriented X 3, normal affect, Insight and Judgment appropriate.     Revonda Humphrey ANP-C  [facility]  09/09/2023

## 2023-09-10 ENCOUNTER — Other Ambulatory Visit: Payer: Self-pay | Admitting: Nurse Practitioner

## 2023-09-10 ENCOUNTER — Encounter: Payer: Self-pay | Admitting: Nurse Practitioner

## 2023-09-10 DIAGNOSIS — G4700 Insomnia, unspecified: Secondary | ICD-10-CM

## 2023-09-10 LAB — COMPLETE METABOLIC PANEL WITH GFR
AG Ratio: 1.8 (calc) (ref 1.0–2.5)
ALT: 22 U/L (ref 6–29)
AST: 29 U/L (ref 10–35)
Albumin: 4.5 g/dL (ref 3.6–5.1)
Alkaline phosphatase (APISO): 138 U/L (ref 37–153)
BUN: 11 mg/dL (ref 7–25)
CO2: 28 mmol/L (ref 20–32)
Calcium: 10.3 mg/dL (ref 8.6–10.4)
Chloride: 100 mmol/L (ref 98–110)
Creat: 0.57 mg/dL (ref 0.50–1.05)
Globulin: 2.5 g/dL (ref 1.9–3.7)
Glucose, Bld: 86 mg/dL (ref 65–99)
Potassium: 4.1 mmol/L (ref 3.5–5.3)
Sodium: 140 mmol/L (ref 135–146)
Total Bilirubin: 0.4 mg/dL (ref 0.2–1.2)
Total Protein: 7 g/dL (ref 6.1–8.1)
eGFR: 101 mL/min/{1.73_m2} (ref >=60)

## 2023-09-10 LAB — CBC WITH DIFFERENTIAL/PLATELET
Absolute Lymphocytes: 2483 {cells}/uL (ref 850–3900)
Absolute Monocytes: 518 {cells}/uL (ref 200–950)
Basophils Absolute: 68 {cells}/uL (ref 0–200)
Basophils Relative: 0.9 %
Eosinophils Absolute: 210 {cells}/uL (ref 15–500)
Eosinophils Relative: 2.8 %
HCT: 37.5 % (ref 35.0–45.0)
Hemoglobin: 12.3 g/dL (ref 11.7–15.5)
MCH: 28 pg (ref 27.0–33.0)
MCHC: 32.8 g/dL (ref 32.0–36.0)
MCV: 85.4 fL (ref 80.0–100.0)
MPV: 10.9 fL (ref 7.5–12.5)
Monocytes Relative: 6.9 %
Neutro Abs: 4223 {cells}/uL (ref 1500–7800)
Neutrophils Relative %: 56.3 %
Platelets: 246 10*3/uL (ref 140–400)
RBC: 4.39 10*6/uL (ref 3.80–5.10)
RDW: 13.7 % (ref 11.0–15.0)
Total Lymphocyte: 33.1 %
WBC: 7.5 10*3/uL (ref 3.8–10.8)

## 2023-09-10 LAB — LIPID PANEL
Cholesterol: 209 mg/dL — ABNORMAL HIGH (ref <200)
HDL: 55 mg/dL (ref >=50)
LDL Cholesterol (Calc): 123 mg/dL — ABNORMAL HIGH
Non-HDL Cholesterol (Calc): 154 mg/dL — ABNORMAL HIGH (ref <130)
Total CHOL/HDL Ratio: 3.8 (calc) (ref <5.0)
Triglycerides: 195 mg/dL — ABNORMAL HIGH (ref <150)

## 2023-09-10 LAB — HEMOGLOBIN A1C W/OUT EAG: Hgb A1c MFr Bld: 5.6 %{Hb} (ref <5.7)

## 2023-09-10 LAB — TSH: TSH: 1.05 m[IU]/L (ref 0.40–4.50)

## 2023-09-10 MED ORDER — ALPRAZOLAM 0.5 MG PO TABS: ORAL_TABLET | ORAL | 0 refills | Status: DC

## 2023-09-10 NOTE — Progress Notes (Signed)
Xanax filled 2 days early this month only. Pt advised: Unfortunately Alprazolam can not be on automatic refill.  We are attempting to get our patients off of benzodiazepines as the AMA recommends tapering and discontinuing due to risk of dementia and addiction with Benzodiazepines.  Please try to start tapering off medication, use 1/2 5 days a week.  For our patients that feel they can not taper off Alprazolam we have been suggesting they follow with psychiatry as they are the experts for this type of medication.

## 2023-10-03 ENCOUNTER — Other Ambulatory Visit: Payer: Self-pay | Admitting: Nurse Practitioner

## 2023-10-03 DIAGNOSIS — E1169 Type 2 diabetes mellitus with other specified complication: Secondary | ICD-10-CM

## 2023-10-14 ENCOUNTER — Other Ambulatory Visit: Payer: Self-pay | Admitting: Nurse Practitioner

## 2023-10-14 ENCOUNTER — Other Ambulatory Visit: Payer: Self-pay | Admitting: Internal Medicine

## 2023-10-14 DIAGNOSIS — G4700 Insomnia, unspecified: Secondary | ICD-10-CM

## 2023-10-14 DIAGNOSIS — I10 Essential (primary) hypertension: Secondary | ICD-10-CM

## 2023-11-02 ENCOUNTER — Other Ambulatory Visit: Payer: Self-pay | Admitting: Nurse Practitioner

## 2023-11-02 DIAGNOSIS — E1169 Type 2 diabetes mellitus with other specified complication: Secondary | ICD-10-CM

## 2023-11-19 ENCOUNTER — Encounter: Payer: Self-pay | Admitting: Nurse Practitioner

## 2023-11-19 NOTE — Progress Notes (Deleted)
 Assessment and Plan:  There are no diagnoses linked to this encounter.    Further disposition pending results of labs. Discussed med's effects and SE's.   Over 30 minutes of exam, counseling, chart review, and critical decision making was performed.   Future Appointments  Date Time Provider Department Center  11/21/2023  3:30 PM Rosalia Mcavoy E, NP [facility] None  12/18/2023  3:30 PM Ezinne Yogi E, NP [facility] None  05/29/2024  9:00 AM Jude Lonell BRAVO, NP [facility] None    ------------------------------------------------------------------------------------------------------------------   HPI LMP 09/01/2013  62 y.o.female presents for  Past Medical History:  Diagnosis Date   Allergy    Anxiety    Arthritis    hip   Cataract    removed bilateral- + lens implants   Fatty liver disease, nonalcoholic 08/19/2013   Via U/S   GERD (gastroesophageal reflux disease)    Hyperlipidemia    Hypertension    Insomnia    Nephrolithiasis    Prediabetes    on metformin    Prolonged QT interval    Thyroid disease    Vitamin D  deficiency      Allergies  Allergen Reactions   Atenolol     Beta blockers. Pt doesn't remember there being a problem   Codeine  Nausea Only   Klonopin [Clonazepam] Rash   Penicillins Rash    Current Outpatient Medications on File Prior to Visit  Medication Sig   ALPRAZolam  (XANAX ) 0.5 MG tablet TAKE 1/2 TO 1 TABLET BY MOUTH  ONCE DAILY IF NEEDED FOR ANXIETY ATTACK. LIMIT TO 5 DAYS PER WEEK TO AVOID ADDICTION AND DEMENTIA   aspirin 81 MG tablet Take 81 mg by mouth daily.   cholecalciferol (VITAMIN D ) 1000 UNITS tablet Take 10,000 Units by mouth daily.    clotrimazole -betamethasone  (LOTRISONE ) cream Apply 1 Application topically 2 (two) times daily.   diltiazem  (CARDIZEM  CD) 240 MG 24 hr capsule TAKE 1 CAPSULE BY MOUTH DAILY  FOR BLOOD PRESSURE   hydrochlorothiazide  (HYDRODIURIL ) 25 MG tablet TAKE 1 TABLET BY MOUTH DAILY FOR BLOOD PRESSURE AND FLUID   RETENTION/ANKLE SWELLING   ibuprofen  (ADVIL ) 800 MG tablet TAKE 1 TABLET BY MOUTH THREE TIMES DAILY AS NEEDED FOR 10 DAYS   losartan  (COZAAR ) 50 MG tablet TAKE 1 TABLET BY MOUTH DAILY   meloxicam  (MOBIC ) 7.5 MG tablet Take 1 tablet every day by oral route as needed for 30 days.   metFORMIN  (GLUCOPHAGE -XR) 500 MG 24 hr tablet TAKE 1 TO 2 TABLETS BY MOUTH TWICE DAILY FOR DIABETES   Multiple Vitamin (MULTIVITAMIN WITH MINERALS) TABS tablet Take 1 tablet by mouth daily.   Omega-3 Fatty Acids (FISH OIL PO) Take 1 capsule by mouth daily.   SYNTHROID  200 MCG tablet TAKE 1/2 TABLET DAILY FOR THYROID   zinc gluconate 50 MG tablet Take 50 mg by mouth daily.   No current facility-administered medications on file prior to visit.    ROS: all negative except above.   Physical Exam:  LMP 09/01/2013   General Appearance: Well nourished, in no apparent distress. Eyes: PERRLA, EOMs, conjunctiva no swelling or erythema Sinuses: No Frontal/maxillary tenderness ENT/Mouth: Ext aud canals clear, TMs without erythema, bulging. No erythema, swelling, or exudate on post pharynx.  Tonsils not swollen or erythematous. Hearing normal.  Neck: Supple, thyroid normal.  Respiratory: Respiratory effort normal, BS equal bilaterally without rales, rhonchi, wheezing or stridor.  Cardio: RRR with no MRGs. Brisk peripheral pulses without edema.  Abdomen: Soft, + BS.  Non tender, no guarding, rebound, hernias,  masses. Lymphatics: Non tender without lymphadenopathy.  Musculoskeletal: Full ROM, 5/5 strength, normal gait.  Skin: Warm, dry without rashes, lesions, ecchymosis.  Neuro: Cranial nerves intact. Normal muscle tone, no cerebellar symptoms. Sensation intact.  Psych: Awake and oriented X 3, normal affect, Insight and Judgment appropriate.     Shannon Pope E Fabiola Mudgett, NP 2:45 PM [facility]

## 2023-11-21 ENCOUNTER — Ambulatory Visit: Payer: 59 | Admitting: Nurse Practitioner

## 2023-11-23 ENCOUNTER — Other Ambulatory Visit: Payer: Self-pay | Admitting: Nurse Practitioner

## 2023-11-23 DIAGNOSIS — G4700 Insomnia, unspecified: Secondary | ICD-10-CM

## 2023-11-25 NOTE — Progress Notes (Signed)
 Assessment and Plan:   Diagnoses and all orders for this visit:  Labial lesion Possible healing herpes lesion but would like to be evaluated with biopsy- referred to GYN -     Ambulatory referral to Gynecology  Essential hypertension - continue medications- hydrochlorothiazide  25 mg every day, Diltiazem  240 mg every day  and Losartan  50 mg every day  - Continue DASH diet, exercise and monitor at home. Call if greater than 130/80.       Further disposition pending results of labs. Discussed med's effects and SE's.   Over 30 minutes of exam, counseling, chart review, and critical decision making was performed.   Future Appointments  Date Time Provider Department Center  12/18/2023  3:30 PM Jude Lonell BRAVO, NP [facility] None  05/29/2024  9:00 AM Jude Lonell BRAVO, NP [facility] None    ------------------------------------------------------------------------------------------------------------------   HPI BP (!) 140/74   Pulse 75   Temp (!) 97.3 F (36.3 C)   Ht 5' 3.5 (1.613 m)   Wt 180 lb (81.6 kg)   LMP 09/01/2013   SpO2 97%   BMI 31.39 kg/m  63 y.o.female presents for area on left labia that was present 1 week ago, tender and bled.  Area has since resolved but states it has occurred in same spot several times in the past. She has never had evaluated previously.   BP well controlled with hydrochlorothiazide  25 mg every day, Diltiazem  240 mg every day  and Losartan  50 mg every day  BP Readings from Last 3 Encounters:  11/26/23 (!) 140/74  09/09/23 132/68  05/30/23 132/72  Denies headaches, chest pain, shortness of breath and dizziness    BMI is Body mass index is 31.39 kg/m., she has not been working on diet and exercise. Wt Readings from Last 3 Encounters:  11/26/23 180 lb (81.6 kg)  09/09/23 174 lb 9.6 oz (79.2 kg)  05/30/23 183 lb (83 kg)   Currently on Levothyroxine  200 mcg 1/2 tab daily Lab Results  Component Value Date   TSH 1.05 09/09/2023     Past  Medical History:  Diagnosis Date   Allergy    Anxiety    Arthritis    hip   Cataract    removed bilateral- + lens implants   Fatty liver disease, nonalcoholic 08/19/2013   Via U/S   GERD (gastroesophageal reflux disease)    Hyperlipidemia    Hypertension    Insomnia    Nephrolithiasis    Prediabetes    on metformin    Prolonged QT interval    Thyroid disease    Vitamin D  deficiency      Allergies  Allergen Reactions   Atenolol     Beta blockers. Pt doesn't remember there being a problem   Codeine  Nausea Only   Klonopin [Clonazepam] Rash   Penicillins Rash    Current Outpatient Medications on File Prior to Visit  Medication Sig   ALPRAZolam  (XANAX ) 0.5 MG tablet TAKE 1/2 TO 1 TABLET BY MOUTH  ONCE DAILY IF NEEDED FOR ANXIETY ATTACK. LIMIT TO 5 DAYS PER WEEK TO AVOID ADDICTION AND DEMENTIA   aspirin 81 MG tablet Take 81 mg by mouth daily.   cholecalciferol (VITAMIN D ) 1000 UNITS tablet Take 10,000 Units by mouth daily.    clotrimazole -betamethasone  (LOTRISONE ) cream Apply 1 Application topically 2 (two) times daily.   diltiazem  (CARDIZEM  CD) 240 MG 24 hr capsule TAKE 1 CAPSULE BY MOUTH DAILY  FOR BLOOD PRESSURE   hydrochlorothiazide  (HYDRODIURIL ) 25 MG tablet TAKE 1  TABLET BY MOUTH DAILY FOR BLOOD PRESSURE AND FLUID  RETENTION/ANKLE SWELLING   ibuprofen  (ADVIL ) 800 MG tablet TAKE 1 TABLET BY MOUTH THREE TIMES DAILY AS NEEDED FOR 10 DAYS   losartan  (COZAAR ) 50 MG tablet TAKE 1 TABLET BY MOUTH DAILY   metFORMIN  (GLUCOPHAGE -XR) 500 MG 24 hr tablet TAKE 1 TO 2 TABLETS BY MOUTH TWICE DAILY FOR DIABETES   Multiple Vitamin (MULTIVITAMIN WITH MINERALS) TABS tablet Take 1 tablet by mouth daily.   Omega-3 Fatty Acids (FISH OIL PO) Take 1 capsule by mouth daily.   SYNTHROID  200 MCG tablet TAKE 1/2 TABLET DAILY FOR THYROID   zinc gluconate 50 MG tablet Take 50 mg by mouth daily.   meloxicam  (MOBIC ) 7.5 MG tablet Take 1 tablet every day by oral route as needed for 30 days. (Patient not  taking: Reported on 11/26/2023)   No current facility-administered medications on file prior to visit.    ROS: all negative except above.   Physical Exam:  BP (!) 140/74   Pulse 75   Temp (!) 97.3 F (36.3 C)   Ht 5' 3.5 (1.613 m)   Wt 180 lb (81.6 kg)   LMP 09/01/2013   SpO2 97%   BMI 31.39 kg/m   General Appearance: Well nourished, in no apparent distress. Eyes: PERRLA, EOMs, conjunctiva no swelling or erythema Neck: Supple, thyroid normal.  Respiratory: Respiratory effort normal, BS equal bilaterally without rales, rhonchi, wheezing or stridor.  Cardio: RRR with no MRGs. Brisk peripheral pulses without edema.  Abdomen: Soft, + BS.  Non tender, no guarding, rebound, hernias, masses. Lymphatics: Non tender without lymphadenopathy.  Musculoskeletal: Full ROM, 5/5 strength, normal gait.  Skin: Warm, dry without rashes, lesions, ecchymosis.  Neuro: Cranial nerves intact. Normal muscle tone, no cerebellar symptoms. Sensation intact.  Psych: Awake and oriented X 3, normal affect, Insight and Judgment appropriate.  Pelvic exam: VULVA: vulvar lesion discoloration of approx 1 cm of left labia at introitus. Nontender, no drainage currently     Jennfer Gassen E Diogenes Whirley, NP 2:40 PM [facility]

## 2023-11-26 ENCOUNTER — Ambulatory Visit (INDEPENDENT_AMBULATORY_CARE_PROVIDER_SITE_OTHER): Payer: 59 | Admitting: Nurse Practitioner

## 2023-11-26 ENCOUNTER — Encounter: Payer: Self-pay | Admitting: Nurse Practitioner

## 2023-11-26 VITALS — BP 140/74 | HR 75 | Temp 97.3°F | Ht 63.5 in | Wt 180.0 lb

## 2023-11-26 DIAGNOSIS — I10 Essential (primary) hypertension: Secondary | ICD-10-CM

## 2023-11-26 DIAGNOSIS — N9089 Other specified noninflammatory disorders of vulva and perineum: Secondary | ICD-10-CM

## 2023-11-26 NOTE — Telephone Encounter (Signed)
 This is who she wants to see

## 2023-11-30 ENCOUNTER — Other Ambulatory Visit: Payer: Self-pay | Admitting: Nurse Practitioner

## 2023-11-30 DIAGNOSIS — E1169 Type 2 diabetes mellitus with other specified complication: Secondary | ICD-10-CM

## 2023-12-02 ENCOUNTER — Other Ambulatory Visit: Payer: Self-pay | Admitting: Nurse Practitioner

## 2023-12-02 DIAGNOSIS — E1169 Type 2 diabetes mellitus with other specified complication: Secondary | ICD-10-CM

## 2023-12-10 DIAGNOSIS — E039 Hypothyroidism, unspecified: Secondary | ICD-10-CM | POA: Insufficient documentation

## 2023-12-18 ENCOUNTER — Ambulatory Visit: Payer: 59 | Admitting: Nurse Practitioner

## 2023-12-31 ENCOUNTER — Other Ambulatory Visit: Payer: Self-pay | Admitting: Nurse Practitioner

## 2023-12-31 DIAGNOSIS — G4700 Insomnia, unspecified: Secondary | ICD-10-CM

## 2023-12-31 MED ORDER — ALPRAZOLAM 0.5 MG PO TABS: ORAL_TABLET | ORAL | 0 refills | Status: DC

## 2024-01-01 ENCOUNTER — Other Ambulatory Visit: Payer: Self-pay

## 2024-01-01 MED ORDER — HYDROCHLOROTHIAZIDE 25 MG PO TABS: ORAL_TABLET | ORAL | 0 refills | Status: DC

## 2024-01-23 DIAGNOSIS — M545 Low back pain, unspecified: Secondary | ICD-10-CM | POA: Insufficient documentation

## 2024-04-25 ENCOUNTER — Emergency Department (HOSPITAL_BASED_OUTPATIENT_CLINIC_OR_DEPARTMENT_OTHER)

## 2024-04-25 ENCOUNTER — Emergency Department (HOSPITAL_BASED_OUTPATIENT_CLINIC_OR_DEPARTMENT_OTHER)
Admission: EM | Admit: 2024-04-25 | Discharge: 2024-04-25 | Disposition: A | Discharge status: Home / Self Care | Attending: Emergency Medicine | Admitting: Emergency Medicine

## 2024-04-25 ENCOUNTER — Encounter (HOSPITAL_BASED_OUTPATIENT_CLINIC_OR_DEPARTMENT_OTHER): Payer: Self-pay | Admitting: Emergency Medicine

## 2024-04-25 ENCOUNTER — Other Ambulatory Visit: Payer: Self-pay

## 2024-04-25 DIAGNOSIS — Z7982 Long term (current) use of aspirin: Secondary | ICD-10-CM | POA: Insufficient documentation

## 2024-04-25 DIAGNOSIS — N3000 Acute cystitis without hematuria: Secondary | ICD-10-CM | POA: Insufficient documentation

## 2024-04-25 DIAGNOSIS — Z87891 Personal history of nicotine dependence: Secondary | ICD-10-CM | POA: Insufficient documentation

## 2024-04-25 DIAGNOSIS — R051 Acute cough: Secondary | ICD-10-CM | POA: Insufficient documentation

## 2024-04-25 DIAGNOSIS — E119 Type 2 diabetes mellitus without complications: Secondary | ICD-10-CM | POA: Insufficient documentation

## 2024-04-25 DIAGNOSIS — R1110 Vomiting, unspecified: Secondary | ICD-10-CM | POA: Diagnosis not present

## 2024-04-25 DIAGNOSIS — Z79899 Other long term (current) drug therapy: Secondary | ICD-10-CM | POA: Insufficient documentation

## 2024-04-25 DIAGNOSIS — I10 Essential (primary) hypertension: Secondary | ICD-10-CM | POA: Diagnosis not present

## 2024-04-25 DIAGNOSIS — R932 Abnormal findings on diagnostic imaging of liver and biliary tract: Secondary | ICD-10-CM | POA: Insufficient documentation

## 2024-04-25 DIAGNOSIS — Z7984 Long term (current) use of oral hypoglycemic drugs: Secondary | ICD-10-CM | POA: Insufficient documentation

## 2024-04-25 LAB — RESP PANEL BY RT-PCR (RSV, FLU A&B, COVID)  RVPGX2
Influenza A by PCR: NEGATIVE
Influenza B by PCR: NEGATIVE
Resp Syncytial Virus by PCR: NEGATIVE
SARS Coronavirus 2 by RT PCR: NEGATIVE

## 2024-04-25 LAB — COMPREHENSIVE METABOLIC PANEL WITH GFR
ALT: 28 U/L (ref 0–44)
AST: 35 U/L (ref 15–41)
Albumin: 4 g/dL (ref 3.5–5.0)
Alkaline Phosphatase: 130 U/L — ABNORMAL HIGH (ref 38–126)
Anion gap: 14 (ref 5–15)
BUN: 9 mg/dL (ref 8–23)
CO2: 23 mmol/L (ref 22–32)
Calcium: 9.3 mg/dL (ref 8.9–10.3)
Chloride: 94 mmol/L — ABNORMAL LOW (ref 98–111)
Creatinine, Ser: 0.65 mg/dL (ref 0.44–1.00)
GFR, Estimated: >60 mL/min (ref >60)
Glucose, Bld: 130 mg/dL — ABNORMAL HIGH (ref 70–99)
Potassium: 3.1 mmol/L — ABNORMAL LOW (ref 3.5–5.1)
Sodium: 132 mmol/L — ABNORMAL LOW (ref 135–145)
Total Bilirubin: 0.7 mg/dL (ref 0.0–1.2)
Total Protein: 7.3 g/dL (ref 6.5–8.1)

## 2024-04-25 LAB — URINALYSIS, W/ REFLEX TO CULTURE (INFECTION SUSPECTED)
Bilirubin Urine: NEGATIVE
Glucose, UA: NEGATIVE mg/dL
Ketones, ur: NEGATIVE mg/dL
Nitrite: NEGATIVE
Protein, ur: 100 mg/dL — AB
Specific Gravity, Urine: 1.02 (ref 1.005–1.030)
pH: 6 (ref 5.0–8.0)

## 2024-04-25 LAB — CBC WITH DIFFERENTIAL/PLATELET
Abs Immature Granulocytes: 0.09 10*3/uL — ABNORMAL HIGH (ref 0.00–0.07)
Basophils Absolute: 0.1 10*3/uL (ref 0.0–0.1)
Basophils Relative: 0 %
Eosinophils Absolute: 0 10*3/uL (ref 0.0–0.5)
Eosinophils Relative: 0 %
HCT: 34.2 % — ABNORMAL LOW (ref 36.0–46.0)
Hemoglobin: 11.5 g/dL — ABNORMAL LOW (ref 12.0–15.0)
Immature Granulocytes: 1 %
Lymphocytes Relative: 8 %
Lymphs Abs: 1.2 10*3/uL (ref 0.7–4.0)
MCH: 27.4 pg (ref 26.0–34.0)
MCHC: 33.6 g/dL (ref 30.0–36.0)
MCV: 81.6 fL (ref 80.0–100.0)
Monocytes Absolute: 1.6 10*3/uL — ABNORMAL HIGH (ref 0.1–1.0)
Monocytes Relative: 11 %
Neutro Abs: 12.1 10*3/uL — ABNORMAL HIGH (ref 1.7–7.7)
Neutrophils Relative %: 80 %
Platelets: 160 10*3/uL (ref 150–400)
RBC: 4.19 MIL/uL (ref 3.87–5.11)
RDW: 15.4 % (ref 11.5–15.5)
WBC: 15 10*3/uL — ABNORMAL HIGH (ref 4.0–10.5)
nRBC: 0 % (ref 0.0–0.2)

## 2024-04-25 LAB — PROTIME-INR
INR: 1.3 — ABNORMAL HIGH (ref 0.8–1.2)
Prothrombin Time: 16.1 s — ABNORMAL HIGH (ref 11.4–15.2)

## 2024-04-25 LAB — LACTIC ACID, PLASMA: Lactic Acid, Venous: 1.6 mmol/L (ref 0.5–1.9)

## 2024-04-25 MED ORDER — POTASSIUM CHLORIDE CRYS ER 20 MEQ PO TBCR: 20.0000 meq | EXTENDED_RELEASE_TABLET | Freq: Two times a day (BID) | ORAL | 0 refills | Status: DC

## 2024-04-25 MED ORDER — ONDANSETRON 4 MG PO TBDP: 4.0000 mg | ORAL_TABLET | Freq: Three times a day (TID) | ORAL | 0 refills | Status: DC | PRN

## 2024-04-25 MED ORDER — LACTATED RINGERS IV BOLUS (SEPSIS)
1000.0000 mL | Freq: Once | INTRAVENOUS | Status: AC
  Administered 2024-04-25: 1000 mL via INTRAVENOUS

## 2024-04-25 MED ORDER — IOHEXOL 300 MG/ML  SOLN
75.0000 mL | Freq: Once | INTRAMUSCULAR | Status: AC | PRN
  Administered 2024-04-25: 75 mL via INTRAVENOUS

## 2024-04-25 MED ORDER — LEVOFLOXACIN IN D5W 750 MG/150ML IV SOLN
750.0000 mg | Freq: Once | INTRAVENOUS | Status: AC
  Administered 2024-04-25: 750 mg via INTRAVENOUS
  Filled 2024-04-25: qty 150

## 2024-04-25 MED ORDER — CEPHALEXIN 500 MG PO CAPS: 500.0000 mg | ORAL_CAPSULE | Freq: Four times a day (QID) | ORAL | 0 refills | Status: DC

## 2024-04-25 MED ORDER — LACTATED RINGERS IV SOLN: INTRAVENOUS | Status: DC

## 2024-04-25 MED ORDER — ACETAMINOPHEN 325 MG PO TABS: 650.0000 mg | ORAL_TABLET | Freq: Once | ORAL | Status: DC

## 2024-04-25 MED ORDER — IBUPROFEN 400 MG PO TABS
600.0000 mg | ORAL_TABLET | Freq: Once | ORAL | Status: AC
  Administered 2024-04-25: 600 mg via ORAL
  Filled 2024-04-25: qty 1

## 2024-04-25 MED ORDER — LACTATED RINGERS IV BOLUS (SEPSIS)
500.0000 mL | Freq: Once | INTRAVENOUS | Status: AC
  Administered 2024-04-25: 500 mL via INTRAVENOUS

## 2024-04-25 NOTE — Discharge Instructions (Addendum)
 As we discussed, you are being given an antibiotic for a urinary tract infection. You do not have any sign of pneumonia or other lung infection. Follow up with your doctor in close recheck this week. If your symptoms worsen, return to the emergency department for recheck and further evaluation.   Also as discussed, your chest CT reports cirrhotic changes to your liver that need to be further evaluated by a gastroenterologist. Cal the [place] [facility] office to schedule an appointment for this follow up.   Your potassium was a little low and you have been given a supplement to take for the next 3 days. A prescription for Zofran was also provided which is as needed for any nausea.

## 2024-04-25 NOTE — ED Provider Notes (Signed)
 [place] [facility] Provider Note   CSN: [id number] Arrival date & time: 04/25/24  1734     History  Chief Complaint  Patient presents with   Fever    Shannon Pope is a 63 y.o. female.  Patient to ED with symptoms since yesterday of fever, cough, mild confusion. No nausea with vomiting but she does have infrequent post-tussive vomiting. No urinary symptoms.   The history is provided by the patient. No language interpreter was used.  Fever      Home Medications Prior to Admission medications   Medication Sig Start Date End Date Taking? Authorizing Provider  cephALEXin (KEFLEX) 500 MG capsule Take 1 capsule (500 mg total) by mouth 4 (four) times daily. 04/25/24  Yes Lian Tanori, PA-C  ondansetron (ZOFRAN-ODT) 4 MG disintegrating tablet Take 1 tablet (4 mg total) by mouth every 8 (eight) hours as needed for nausea or vomiting. 04/25/24  Yes Kojo Liby, Clovis Dar, PA-C  potassium chloride SA (KLOR-CON M) 20 MEQ tablet Take 1 tablet (20 mEq total) by mouth 2 (two) times daily. 04/25/24  Yes Harper Smoker, PA-C  ALPRAZolam  (XANAX ) 0.5 MG tablet TAKE 1/2 TO 1 TABLET BY MOUTH  ONCE DAILY IF NEEDED FOR ANXIETY ATTACK. LIMIT TO 5 DAYS PER WEEK TO AVOID ADDICTION AND DEMENTIA 12/31/23   Webb, Padonda B, FNP  aspirin 81 MG tablet Take 81 mg by mouth daily.    [provider]  cholecalciferol (VITAMIN D ) 1000 UNITS tablet Take 10,000 Units by mouth daily.     [provider]  clotrimazole -betamethasone  (LOTRISONE ) cream Apply 1 Application topically 2 (two) times daily. 08/30/23   Wilkinson, Dana E, FNP  diltiazem  (CARDIZEM  CD) 240 MG 24 hr capsule TAKE 1 CAPSULE BY MOUTH DAILY  FOR BLOOD PRESSURE 10/14/23   Cranford, Tonya, NP  hydrochlorothiazide  (HYDRODIURIL ) 25 MG tablet TAKE 1 TABLET BY MOUTH  DAILY FOR BLOOD PRESSURE  AND FLUID RETENTION/ANKLE  SWELLING 01/01/24   Webb, Padonda B, FNP  ibuprofen  (ADVIL ) 800 MG tablet TAKE 1 TABLET BY MOUTH  THREE TIMES DAILY AS NEEDED FOR 10 DAYS    [provider]  losartan  (COZAAR ) 50 MG tablet TAKE 1 TABLET BY MOUTH DAILY 10/14/23   Wilkinson, Dana E, FNP  metFORMIN  (GLUCOPHAGE -XR) 500 MG 24 hr tablet TAKE 1 TO 2 TABLETS BY MOUTH TWICE DAILY FOR DIABETES 12/02/23   Wilkinson, Dana E, FNP  Multiple Vitamin (MULTIVITAMIN WITH MINERALS) TABS tablet Take 1 tablet by mouth daily.    [provider]  Omega-3 Fatty Acids (FISH OIL PO) Take 1 capsule by mouth daily.    [provider]  SYNTHROID  200 MCG tablet TAKE 1/2 TABLET DAILY FOR THYROID 08/05/23   Wilkinson, Dana E, FNP  zinc gluconate 50 MG tablet Take 50 mg by mouth daily.    [provider]      Allergies    Atenolol, Codeine , Klonopin [clonazepam], and Penicillins    Review of Systems   Review of Systems  Constitutional:  Positive for fever.    Physical Exam Updated Vital Signs BP (!) 112/58   Pulse 78   Temp 99.2 F (37.3 C)   Resp (!) 24   Ht 5\' 4"  (1.626 m)   Wt 80.7 kg   LMP 09/01/2013   SpO2 95%   BMI 30.55 kg/m  Physical Exam Vitals and nursing note reviewed.  Constitutional:      Appearance: She is well-developed.  HENT:     Head: Normocephalic.  Cardiovascular:     Rate and Rhythm: Normal rate and regular rhythm.     Heart sounds: No murmur heard. Pulmonary:     Effort: Pulmonary effort is normal.     Breath sounds: Normal breath sounds. No wheezing, rhonchi or rales.  Abdominal:     General: Bowel sounds are normal.     Palpations: Abdomen is soft.     Tenderness: There is no abdominal tenderness. There is no guarding or rebound.  Musculoskeletal:        General: Normal range of motion.     Cervical back: Normal range of motion and neck supple.  Skin:    General: Skin is warm and dry.     Findings: No rash.  Neurological:     General: No focal deficit present.     Mental Status: She is alert and oriented to person, place, and time.     ED Results / Procedures /  Treatments   Labs (all labs ordered are listed, but only abnormal results are displayed) Labs Reviewed  COMPREHENSIVE METABOLIC PANEL WITH GFR - Abnormal; Notable for the following components:      Result Value   Sodium 132 (*)    Potassium 3.1 (*)    Chloride 94 (*)    Glucose, Bld 130 (*)    Alkaline Phosphatase 130 (*)    All other components within normal limits  CBC WITH DIFFERENTIAL/PLATELET - Abnormal; Notable for the following components:   WBC 15.0 (*)    Hemoglobin 11.5 (*)    HCT 34.2 (*)    Neutro Abs 12.1 (*)    Monocytes Absolute 1.6 (*)    Abs Immature Granulocytes 0.09 (*)    All other components within normal limits  PROTIME-INR - Abnormal; Notable for the following components:   Prothrombin Time 16.1 (*)    INR 1.3 (*)    All other components within normal limits  URINALYSIS, W/ REFLEX TO CULTURE (INFECTION SUSPECTED) - Abnormal; Notable for the following components:   APPearance CLOUDY (*)    Hgb urine dipstick TRACE (*)    Protein, ur 100 (*)    Leukocytes,Ua MODERATE (*)    Bacteria, UA MANY (*)    All other components within normal limits  RESP PANEL BY RT-PCR (RSV, FLU A&B, COVID)  [id number]  CULTURE, BLOOD (ROUTINE X 2)  CULTURE, BLOOD (ROUTINE X 2)  LACTIC ACID, PLASMA  LACTIC ACID, PLASMA   Results for orders placed or performed during the hospital encounter of 04/25/24  Resp panel by RT-PCR (RSV, Flu A&B, Covid) Anterior Nasal Swab   Collection Time: 04/25/24  5:42 PM   Specimen: Anterior Nasal Swab  Result Value Ref Range   SARS Coronavirus 2 by RT PCR NEGATIVE NEGATIVE   Influenza A by PCR NEGATIVE NEGATIVE   Influenza B by PCR NEGATIVE NEGATIVE   Resp Syncytial Virus by PCR NEGATIVE NEGATIVE  Lactic acid, plasma   Collection Time: 04/25/24  6:04 PM  Result Value Ref Range   Lactic Acid, Venous 1.6 0.5 - 1.9 mmol/L  Comprehensive metabolic panel   Collection Time: 04/25/24  6:04 PM  Result Value Ref Range   Sodium 132 (L) 135 - 145 mmol/L    Potassium 3.1 (L) 3.5 - 5.1 mmol/L   Chloride 94 (L) 98 - 111 mmol/L   CO2 23 22 - 32 mmol/L   Glucose, Bld 130 (H) 70 - 99 mg/dL   BUN 9 8 - 23 mg/dL   Creatinine, Ser 1.61  0.44 - 1.00 mg/dL   Calcium 9.3 8.9 - 10.2 mg/dL   Total Protein 7.3 6.5 - 8.1 g/dL   Albumin 4.0 3.5 - 5.0 g/dL   AST 35 15 - 41 U/L   ALT 28 0 - 44 U/L   Alkaline Phosphatase 130 (H) 38 - 126 U/L   Total Bilirubin 0.7 0.0 - 1.2 mg/dL   GFR, Estimated >72 >53 mL/min   Anion gap 14 5 - 15  CBC with Differential   Collection Time: 04/25/24  6:04 PM  Result Value Ref Range   WBC 15.0 (H) 4.0 - 10.5 K/uL   RBC 4.19 3.87 - 5.11 MIL/uL   Hemoglobin 11.5 (L) 12.0 - 15.0 g/dL   HCT 66.4 (L) 40.3 - 47.4 %   MCV 81.6 80.0 - 100.0 fL   MCH 27.4 26.0 - 34.0 pg   MCHC 33.6 30.0 - 36.0 g/dL   RDW 25.9 56.3 - 87.5 %   Platelets 160 150 - 400 K/uL   nRBC 0.0 0.0 - 0.2 %   Neutrophils Relative % 80 %   Neutro Abs 12.1 (H) 1.7 - 7.7 K/uL   Lymphocytes Relative 8 %   Lymphs Abs 1.2 0.7 - 4.0 K/uL   Monocytes Relative 11 %   Monocytes Absolute 1.6 (H) 0.1 - 1.0 K/uL   Eosinophils Relative 0 %   Eosinophils Absolute 0.0 0.0 - 0.5 K/uL   Basophils Relative 0 %   Basophils Absolute 0.1 0.0 - 0.1 K/uL   Immature Granulocytes 1 %   Abs Immature Granulocytes 0.09 (H) 0.00 - 0.07 K/uL  Protime-INR   Collection Time: 04/25/24  6:04 PM  Result Value Ref Range   Prothrombin Time 16.1 (H) 11.4 - 15.2 seconds   INR 1.3 (H) 0.8 - 1.2  Urinalysis, w/ Reflex to Culture (Infection Suspected) -Urine, Clean Catch   Collection Time: 04/25/24  6:04 PM  Result Value Ref Range   Specimen Source URINE, CLEAN CATCH    Color, Urine YELLOW YELLOW   APPearance CLOUDY (A) CLEAR   Specific Gravity, Urine 1.020 1.005 - 1.030   pH 6.0 5.0 - 8.0   Glucose, UA NEGATIVE NEGATIVE mg/dL   Hgb urine dipstick TRACE (A) NEGATIVE   Bilirubin Urine NEGATIVE NEGATIVE   Ketones, ur NEGATIVE NEGATIVE mg/dL   Protein, ur 643 (A) NEGATIVE mg/dL    Nitrite NEGATIVE NEGATIVE   Leukocytes,Ua MODERATE (A) NEGATIVE   Squamous Epithelial / HPF 6-10 0 - 5 /HPF   WBC, UA 21-50 0 - 5 WBC/hpf   RBC / HPF 6-10 0 - 5 RBC/hpf   Bacteria, UA MANY (A) NONE SEEN     EKG EKG Interpretation Date/Time:  Saturday April 25 2024 18:09:32 EDT Ventricular Rate:  97 PR Interval:  145 QRS Duration:  97 QT Interval:  412 QTC Calculation: 524 R Axis:   22  Text Interpretation: Sinus rhythm Low voltage, precordial leads Borderline repolarization abnormality Prolonged QT interval No previous ECGs available Confirmed by Zackowski, Scott [phone]) on 04/25/2024 6:13:37 PM  Radiology CT Chest W Contrast Result Date: 04/25/2024 CLINICAL DATA:  Fever, cough and body aches. EXAM: CT CHEST WITH CONTRAST TECHNIQUE: Multidetector CT imaging of the chest was performed during intravenous contrast administration. RADIATION DOSE REDUCTION: This exam was performed according to the departmental dose-optimization program which includes automated exposure control, adjustment of the mA and/or kV according to patient size and/or use of iterative reconstruction technique. CONTRAST:  75mL OMNIPAQUE  IOHEXOL  300 MG/ML  SOLN COMPARISON:  06/16/2021 CT scan. FINDINGS: Cardiovascular: The heart is normal in size. No pericardial effusion. The aorta is normal in caliber. Minimal scattered atherosclerotic calcifications. No dissection. No obvious coronary artery calcifications. The pulmonary arteries are grossly normal. Mediastinum/Nodes: No mediastinal or hilar mass or lymphadenopathy. Small amount of fluid noted in the pericardial recesses. The esophagus is unremarkable. Lungs/Pleura: No acute pulmonary process. No infiltrates, edema or effusions. Minimal dependent bibasilar atelectasis. No pulmonary lesions. The central tracheobronchial tree is unremarkable. Upper Abdomen: Advanced cirrhotic changes involving the liver but no hepatic lesions or intrahepatic biliary dilatation. The portal vein is  patent. Gallbladder is surgically absent. Associated portal venous hypertension with portal venous collaterals and splenomegaly. No upper abdominal ascites. Musculoskeletal: No significant bony findings. IMPRESSION: 1. No acute pulmonary findings. 2. No mediastinal or hilar mass or adenopathy. 3. Advanced cirrhotic changes involving the liver with portal venous hypertension, portal venous collaterals and splenomegaly. No upper abdominal ascites. Electronically Signed   By: Marrian Siva M.D.   On: 04/25/2024 21:33   DG Chest Port 1 View Result Date: 04/25/2024 CLINICAL DATA:  Cough, fever, body aches EXAM: PORTABLE CHEST 1 VIEW COMPARISON:  None Available. FINDINGS: Heart and mediastinal contours are within normal limits. No focal opacities or effusions. No acute bony abnormality. Aortic atherosclerosis. IMPRESSION: No active disease. Electronically Signed   By: Janeece Mechanic M.D.   On: 04/25/2024 18:22    Procedures Procedures    Medications Ordered in ED Medications  lactated ringers infusion ( Intravenous New Bag/Given 04/25/24 1916)  acetaminophen  (TYLENOL ) tablet 650 mg (0 mg Oral Hold 04/25/24 2031)  lactated ringers bolus 1,000 mL (0 mLs Intravenous Stopped 04/25/24 2032)    And  lactated ringers bolus 1,000 mL (0 mLs Intravenous Stopped 04/25/24 2032)    And  lactated ringers bolus 500 mL (0 mLs Intravenous Stopped 04/25/24 2004)  levofloxacin  (LEVAQUIN ) IVPB 750 mg (0 mg Intravenous Stopped 04/25/24 2004)  ibuprofen  (ADVIL ) tablet 600 mg (600 mg Oral Given 04/25/24 1818)  iohexol  (OMNIPAQUE ) 300 MG/ML solution 75 mL (75 mLs Intravenous Contrast Given 04/25/24 2123)    ED Course/ Medical Decision Making/ A&P                                 Medical Decision Making This patient presents to the ED for concern of fever, cough, this involves an extensive number of treatment options, and is a complaint that carries with it a high risk of complications and morbidity.  The differential diagnosis includes  PNA, sepsis,    Co morbidities that complicate the patient evaluation  Thyroid dis, HTN, HLD, GERD,    Additional history obtained:  Additional history and/or information obtained from chart review, notable for per daughter, patient was treated about 2 months ago with similar symptoms after being bitten by a tick. No testing done at that time. Symptoms resolved completely with doxycycline  given at that time.    Lab Tests:  I Ordered, and personally interpreted labs.  The pertinent results include:   UA - urine cloudy in appearance; many bacteria, 20-50 WBC; protein 100 CBC: WBC 15.0, Hgb 11.5, normal plts Cmet: Na 132, K+ 3.1, Cl 94, glucose 130, alk phos 130    Imaging Studies ordered:  I ordered imaging studies including CXR I independently visualized and interpreted imaging which showed no infiltrates or consolidations I agree with the radiologist interpretation   Cardiac Monitoring:  The patient was maintained on a  cardiac monitor.  I personally viewed and interpreted the cardiac monitored which showed an underlying rhythm of:  EKG Interpretation Date/Time:  Saturday April 25 2024 18:09:32 EDT Ventricular Rate:  97 PR Interval:  145 QRS Duration:  97 QT Interval:  412 QTC Calculation: 524 R Axis:   22  Text Interpretation: Sinus rhythm Low voltage, precordial leads Borderline repolarization abnormality Prolonged QT interval No previous ECGs available Confirmed by Zackowski, Scott [phone]) on 04/25/2024 6:13:37 PM    Test Considered:  CT chest w/CM - symptoms are respiratory in nature. Appeared septic on presentation. Neg CXR is felt inconsistent and CT chest warranted. Patient updated and in agreement with plan.  CT chest per radiology interpretation: IMPRESSION: 1. No acute pulmonary findings. 2. No mediastinal or hilar mass or adenopathy. 3. Advanced cirrhotic changes involving the liver with portal venous hypertension, portal venous collaterals and  splenomegaly. No upper abdominal ascites.    Critical Interventions:  N/a   Consultations Obtained:  I requested consultation with the n/a,  and discussed lab and imaging findings as well as pertinent plan - they recommend: n/a   Problem List / ED Course:  Here with cough. Septic appearing on arrival with fever, tachycardia, tachypnea.  Fever responded well to ibuprofen  IV fluids provided per sepsis protocol Lactic acid negative CXR, CT chest performed and are negative CT chest showing cirrhotic changes - patient unaware of any diagnosis of cirrhosis. Discussed this and importance of follow up with gastroenterology. LFT's normal.  She has evidence of UTI - given Keflex. Culture pending.  Discussed strict return precautions.    Reevaluation:  After the interventions noted above, I reevaluated the patient and found that they have :improved   Social Determinants of Health:  Former smoker   Disposition:  After consideration of the diagnostic results and the patients response to treatment, I feel that the patient would benefit from discharge to home..   Amount and/or Complexity of Data Reviewed Labs: ordered. Radiology: ordered.  Risk OTC drugs. Prescription drug management.           Final Clinical Impression(s) / ED Diagnoses Final diagnoses:  Acute cough  Acute cystitis without hematuria  Abnormal CT of liver    Rx / DC Orders ED Discharge Orders          Ordered    potassium chloride SA (KLOR-CON M) 20 MEQ tablet  2 times daily        04/25/24 2149    cephALEXin (KEFLEX) 500 MG capsule  4 times daily        04/25/24 2149    ondansetron (ZOFRAN-ODT) 4 MG disintegrating tablet  Every 8 hours PRN        04/25/24 2149              Mandy Second, PA-C 04/25/24 2156

## 2024-04-25 NOTE — Progress Notes (Signed)
 Elink following for sepsis protocol.

## 2024-04-25 NOTE — ED Triage Notes (Signed)
 Pt reports fever, cough, body aches and "shaking"  since last night; was bitten by tick in April and had similar sxs; was treated w/ Doxycycline , but was not tested for anything; has had some confusion per daughter, A & O x 4; took Tylenol  at 1630

## 2024-04-30 LAB — CULTURE, BLOOD (ROUTINE X 2)
Culture: NO GROWTH
Culture: NO GROWTH

## 2024-05-29 ENCOUNTER — Encounter: Payer: 59 | Admitting: Nurse Practitioner

## 2024-06-18 ENCOUNTER — Other Ambulatory Visit: Payer: Self-pay | Admitting: Nurse Practitioner

## 2024-06-18 DIAGNOSIS — E785 Hyperlipidemia, unspecified: Secondary | ICD-10-CM

## 2024-07-16 ENCOUNTER — Other Ambulatory Visit: Payer: Self-pay | Admitting: Gastroenterology

## 2024-07-16 DIAGNOSIS — K7460 Unspecified cirrhosis of liver: Secondary | ICD-10-CM

## 2024-07-27 ENCOUNTER — Ambulatory Visit
Admission: RE | Admit: 2024-07-27 | Discharge: 2024-07-27 | Disposition: A | Discharge status: Home / Self Care | Source: Ambulatory Visit | Attending: Gastroenterology | Admitting: Gastroenterology

## 2024-07-27 DIAGNOSIS — K7460 Unspecified cirrhosis of liver: Secondary | ICD-10-CM

## 2024-07-27 MED ORDER — GADOPICLENOL 0.5 MMOL/ML IV SOLN
8.0000 mL | Freq: Once | INTRAVENOUS | Status: AC | PRN
Start: 2024-07-27 — End: 2024-07-27
  Administered 2024-07-27: 8 mL via INTRAVENOUS

## 2024-08-03 ENCOUNTER — Other Ambulatory Visit: Payer: Self-pay

## 2024-08-03 ENCOUNTER — Other Ambulatory Visit: Payer: Self-pay | Admitting: Urology

## 2024-08-03 ENCOUNTER — Encounter (HOSPITAL_COMMUNITY): Payer: Self-pay

## 2024-08-03 ENCOUNTER — Encounter (HOSPITAL_COMMUNITY): Payer: Self-pay | Admitting: Urology

## 2024-08-03 NOTE — Progress Notes (Signed)
 For Anesthesia: PCP - Alm Rav, MD  Cardiologist - Lavona Agent, MD   Bowel Prep reminder: N/A  Chest x-ray - 04/25/24 in [facility] EKG - 04/25/24 in [facility] Stress Test - 12/01/19 in [facility] ECHO - N/A Cardiac Cath - N/A Pacemaker/ICD device last checked: N/A Pacemaker orders received: N/A Device Rep notified: N/A  Spinal Cord Stimulator: N/A  Sleep Study - N/A CPAP - N/A  Fasting Blood Sugar - N/A Checks Blood Sugar __N/A___ times a day Date and result of last Hgb A1c- requested from Dr. Kathryn office  Last dose of GLP1 agonist- N/A GLP1 instructions: Hold 7 days prior to schedule (Hold 24 hours-daily)   Last dose of SGLT-2 inhibitors- N/A SGLT-2 instructions: Hold 72 hours prior to surgery  Blood Thinner Instructions:N/A Aspirin Instructions:81 mg Last Dose:N/A  Activity level: Able to exercise without chest pain and/or shortness of breath    Anesthesia review: prolonged QT syndrome   Patient denies shortness of breath, fever, cough and chest pain at PAT appointment   Patient verbalized understanding of instructions that were reviewed over the telephone.

## 2024-08-03 NOTE — Anesthesia Preprocedure Evaluation (Signed)
 Anesthesia Evaluation  Patient identified by MRN, date of birth, ID band Patient awake    Reviewed: Allergy & Precautions, NPO status , Patient's Chart, lab work & pertinent test results  Airway Mallampati: II  TM Distance: >3 FB Neck ROM: Full    Dental no notable dental hx. (+) Teeth Intact, Caps, Dental Advisory Given   Pulmonary former smoker   Pulmonary exam normal breath sounds clear to auscultation       Cardiovascular hypertension, Pt. on medications Normal cardiovascular exam Rhythm:Regular Rate:Normal     Neuro/Psych   Anxiety     negative neurological ROS  negative psych ROS   GI/Hepatic Neg liver ROS,GERD  Medicated,,  Endo/Other  diabetes, Well Controlled, Type 2Hypothyroidism  HLD  Renal/GU Renal diseaseLeft ureteral calculus  negative genitourinary   Musculoskeletal  (+) Arthritis , Osteoarthritis,    Abdominal   Peds  Hematology  (+) Blood dyscrasia, anemia   Anesthesia Other Findings   Reproductive/Obstetrics                              Anesthesia Physical Anesthesia Plan  ASA: 2  Anesthesia Plan: General   Post-op Pain Management: Dilaudid  IV, Precedex and Ofirmev  IV (intra-op)*   Induction: Intravenous  PONV Risk Score and Plan: 4 or greater and Treatment may vary due to age or medical condition, Midazolam , Ondansetron  and Dexamethasone   Airway Management Planned: LMA  Additional Equipment: None  Intra-op Plan:   Post-operative Plan: Extubation in OR  Informed Consent: I have reviewed the patients History and Physical, chart, labs and discussed the procedure including the risks, benefits and alternatives for the proposed anesthesia with the patient or authorized representative who has indicated his/her understanding and acceptance.     Dental advisory given  Plan Discussed with: CRNA and Anesthesiologist  Anesthesia Plan Comments: (See PAT note from  9/15)         Anesthesia Quick Evaluation

## 2024-08-03 NOTE — Progress Notes (Signed)
 Case: [id number] Date/Time: 08/04/24 1315   Procedure: CYSTOSCOPY/URETEROSCOPY/HOLMIUM LASER/STENT PLACEMENT (Left) - CYSTOSCOPY/LEFT URETEROSCOPY/HOLMIUM LASER/STENT PLACEMENT/RETROGRADE PYELOGRAM   Anesthesia type: General   Diagnosis: Ureteral stone [N20.1]   Pre-op diagnosis: LEFT URETERAL CALCULUS   Location: [facility] ROOM 06 / [facility]   Surgeons: Elisabeth Valli BIRCH, MD       DISCUSSION: Terri Rorrer is a 63 yo female with PMH of former smoking, HTN, prolonged QT, GERD, cirrhosis, hypothyroid, prediabetes, anxiety, arthritis.  Patient had prior ischemic w/u with Cardiology in 2020 when she was referred for abnormal EKG. CT calcium score was 0. Exercise tolerance test was abnormal and subsequently coronary CTA done which showed normal coronaries. No further w/u recommended.  Seen by GI on 8/28 for new diagnosis of cirrhosis. MRI abdomen obtained which confirms cirrhosis. LFTs normal. MELD score 6  EKG in June 2025 shows QTc 524. Consider obtaining EKG DOS  VS: Ht 5' 4.5 (1.638 m)   Wt 73.5 kg   LMP 09/01/2013   BMI 27.38 kg/m   PROVIDERS: Seabron Lenis, MD   LABS: Obtain DOS   IMAGES:  MRI Abdomen 07/27/24:  IMPRESSION: 1. Cirrhosis without evidence of hepatocellular carcinoma. 2. Moderate left-sided hydroureteronephrosis, followed to the level of the mid pelvis. Possible area of distal left ureteric enhancement. Consider further evaluation with pre and postcontrast hematuria protocol CT to exclude either obstructive stone or urothelial lesion. 3.  Aortic Atherosclerosis (ICD10-I70.0).  CT Chest 04/25/24:  IMPRESSION: 1. No acute pulmonary findings. 2. No mediastinal or hilar mass or adenopathy. 3. Advanced cirrhotic changes involving the liver with portal venous hypertension, portal venous collaterals and splenomegaly. No upper abdominal ascites.  EKG 04/25/24:  Sinus rhythm Low voltage, precordial leads Borderline repolarization abnormality Prolonged QT  interval  CV:  Coronary CTA 01/07/2020:  IMPRESSION: 1. Coronary calcium score of 0. This was 0 percentile for age and sex matched control.   2. Normal coronary origin with right dominance.   3. No evidence of CAD.  Stress test 12/01/2019:  Blood pressure demonstrated a normal response to exercise. Horizontal ST segment depression ST segment depression of 2 mm was noted during stress in the II, III, aVF, V6, V5 and V4 leads, and returning to baseline after 5-9 minutes of recovery. The patient experienced no angina during the stress test The test was stopped because the patient complained of fatigue and shortness of breath Overall, the patient's exercise capacity was mildly impaired Duke Treadmill Score: intermediate risk   2 mm horizontal ST segment depression inferiorly and laterally at peak exercise. Exercise tolerance is decreased with prolonged recovery. Fatigue and shortness of breath was noted. Findings suggestive of ischemia. Coronary CT angiography or cardiac catheterization may be indicated.   Past Medical History:  Diagnosis Date   Allergy    Anxiety    Arthritis    hip   Cataract    removed bilateral- + lens implants   Fatty liver disease, nonalcoholic 08/19/2013   Via U/S   GERD (gastroesophageal reflux disease)    Hyperlipidemia    Hypertension    Hypothyroidism    Insomnia    Nephrolithiasis    Prediabetes    on metformin    Prolonged QT interval    Thyroid disease    Vitamin D  deficiency     Past Surgical History:  Procedure Laterality Date   BREAST BIOPSY Left 06/2022   CHOLECYSTECTOMY     COLONOSCOPY     KNEE ARTHROSCOPY Left    LITHOTRIPSY  2016   POLYPECTOMY  TUBAL LIGATION      MEDICATIONS: No current facility-administered medications for this encounter.    ALPRAZolam  (XANAX ) 0.5 MG tablet   aspirin 81 MG tablet   cephALEXin  (KEFLEX ) 500 MG capsule   diltiazem  (CARDIZEM  CD) 240 MG 24 hr capsule   hydrochlorothiazide  (HYDRODIURIL )  25 MG tablet   ibuprofen  (ADVIL ) 800 MG tablet   losartan  (COZAAR ) 50 MG tablet   metFORMIN  (GLUCOPHAGE -XR) 500 MG 24 hr tablet   Multiple Vitamin (MULTIVITAMIN WITH MINERALS) TABS tablet   Omega-3 Fatty Acids (FISH OIL) 1000 MG CAPS   omeprazole (PRILOSEC) 40 MG capsule   Perfluorohexyloctane (MIEBO) 1.338 GM/ML SOLN   SYNTHROID  200 MCG tablet   VITAMIN D  PO   zinc gluconate 50 MG tablet   clotrimazole -betamethasone  (LOTRISONE ) cream   ondansetron  (ZOFRAN -ODT) 4 MG disintegrating tablet   potassium chloride  SA (KLOR-CON  M) 20 MEQ tablet   Burnard CHRISTELLA Senna, PA-C [facility] Short Stay/Anesthesiology [facility] Phone [phone] 08/03/2024 3:07 PM

## 2024-08-03 NOTE — Patient Instructions (Addendum)
 SURGICAL WAITING ROOM VISITATION  Patients having surgery or a procedure may have no more than 2 support people in the waiting area - these visitors may rotate.    Children under the age of 47 must have an adult with them who is not the patient.  Visitors with respiratory illnesses are discouraged from visiting and should remain at home.  If the patient needs to stay at the hospital during part of their recovery, the visitor guidelines for inpatient rooms apply. Pre-op nurse will coordinate an appropriate time for 1 support person to accompany patient in pre-op.  This support person may not rotate.    Please refer to the [facility] website for the visitor guidelines for Inpatients (after your surgery is over and you are in a regular room).       Your procedure is scheduled on: Tuesday, Sept 16, 2025   Report to [facility] Main Entrance    Report to admitting at 11:15 AM   Call this number if you have problems the morning of surgery [phone]   Do not eat food or drink :After Midnight  FOLLOW BOWEL PREP AND ANY ADDITIONAL PRE OP INSTRUCTIONS YOU RECEIVED FROM YOUR SURGEON'S OFFICE!!!     Oral Hygiene is also important to reduce your risk of infection.                                    Remember - BRUSH YOUR TEETH THE MORNING OF SURGERY WITH YOUR REGULAR TOOTHPASTE  DENTURES WILL BE REMOVED PRIOR TO SURGERY PLEASE DO NOT APPLY Poly grip OR ADHESIVES!!!   Do NOT smoke after Midnight   Stop all vitamins and herbal supplements 7 days before surgery.   Take these medicines the morning of surgery with A SIP OF WATER:  Omeprazole Synthroid   DO NOT TAKE ANY ORAL DIABETIC MEDICATIONS DAY OF YOUR SURGERY                              You may not have any metal on your body including hair pins, jewelry, and body piercing             Do not wear make-up, lotions, powders, perfumes/cologne, or deodorant  Do not wear nail polish including gel and S&S, artificial/acrylic  nails, or any other type of covering on natural nails including finger and toenails. If you have artificial nails, gel coating, etc. that needs to be removed by a nail salon please have this removed prior to surgery or surgery may need to be canceled/ delayed if the surgeon/ anesthesia feels like they are unable to be safely monitored.   Do not shave  48 hours prior to surgery.    Do not bring valuables to the hospital. [facility] IS NOT             RESPONSIBLE   FOR VALUABLES.   Contacts, glasses, dentures or bridgework may not be worn into surgery.   Bring small overnight bag day of surgery.   DO NOT BRING YOUR HOME MEDICATIONS TO THE HOSPITAL. PHARMACY WILL DISPENSE MEDICATIONS LISTED ON YOUR MEDICATION LIST TO YOU DURING YOUR ADMISSION IN THE HOSPITAL!    Patients discharged on the day of surgery will not be allowed to drive home.  Someone NEEDS to stay with you for the first 24 hours after anesthesia.   Special Instructions: Bring a  copy of your healthcare power of attorney and living will documents the day of surgery if you haven't scanned them before.              Please read over the following fact sheets you were given: IF YOU HAVE QUESTIONS ABOUT YOUR PRE-OP INSTRUCTIONS PLEASE CALL [phone].   If you received a COVID test during your pre-op visit  it is requested that you wear a mask when out in public, stay away from anyone that may not be feeling well and notify your surgeon if you develop symptoms. If you test positive for Covid or have been in contact with anyone that has tested positive in the last 10 days please notify you surgeon.

## 2024-08-04 ENCOUNTER — Ambulatory Visit (HOSPITAL_COMMUNITY)

## 2024-08-04 ENCOUNTER — Ambulatory Visit (HOSPITAL_COMMUNITY)
Admission: RE | Admit: 2024-08-04 | Discharge: 2024-08-04 | Disposition: A | Discharge status: Home / Self Care | Attending: Urology | Admitting: Urology

## 2024-08-04 ENCOUNTER — Encounter (HOSPITAL_COMMUNITY): Payer: Self-pay | Admitting: Urology

## 2024-08-04 ENCOUNTER — Ambulatory Visit (HOSPITAL_BASED_OUTPATIENT_CLINIC_OR_DEPARTMENT_OTHER): Admitting: Medical

## 2024-08-04 ENCOUNTER — Encounter (HOSPITAL_COMMUNITY)
Admission: RE | Disposition: A | Discharge status: Home / Self Care | Payer: Self-pay | Source: Home / Self Care | Attending: Urology

## 2024-08-04 ENCOUNTER — Encounter (HOSPITAL_COMMUNITY): Admitting: Medical

## 2024-08-04 DIAGNOSIS — K219 Gastro-esophageal reflux disease without esophagitis: Secondary | ICD-10-CM | POA: Insufficient documentation

## 2024-08-04 DIAGNOSIS — E039 Hypothyroidism, unspecified: Secondary | ICD-10-CM | POA: Diagnosis not present

## 2024-08-04 DIAGNOSIS — N201 Calculus of ureter: Secondary | ICD-10-CM | POA: Diagnosis present

## 2024-08-04 DIAGNOSIS — N132 Hydronephrosis with renal and ureteral calculous obstruction: Secondary | ICD-10-CM | POA: Insufficient documentation

## 2024-08-04 DIAGNOSIS — E785 Hyperlipidemia, unspecified: Secondary | ICD-10-CM | POA: Insufficient documentation

## 2024-08-04 DIAGNOSIS — E119 Type 2 diabetes mellitus without complications: Secondary | ICD-10-CM | POA: Diagnosis not present

## 2024-08-04 DIAGNOSIS — Z87891 Personal history of nicotine dependence: Secondary | ICD-10-CM | POA: Insufficient documentation

## 2024-08-04 DIAGNOSIS — Z87442 Personal history of urinary calculi: Secondary | ICD-10-CM | POA: Diagnosis not present

## 2024-08-04 DIAGNOSIS — I10 Essential (primary) hypertension: Secondary | ICD-10-CM | POA: Insufficient documentation

## 2024-08-04 DIAGNOSIS — E1169 Type 2 diabetes mellitus with other specified complication: Secondary | ICD-10-CM

## 2024-08-04 DIAGNOSIS — K760 Fatty (change of) liver, not elsewhere classified: Secondary | ICD-10-CM | POA: Diagnosis not present

## 2024-08-04 HISTORY — PX: CYSTOSCOPY/URETEROSCOPY/HOLMIUM LASER/STENT PLACEMENT: SHX6546

## 2024-08-04 HISTORY — DX: Hypothyroidism, unspecified: E03.9

## 2024-08-04 LAB — GLUCOSE, CAPILLARY
Glucose-Capillary: 94 mg/dL (ref 70–99)
Glucose-Capillary: 102 mg/dL — ABNORMAL HIGH (ref 70–99)

## 2024-08-04 LAB — BASIC METABOLIC PANEL WITH GFR
Anion gap: 13 (ref 5–15)
BUN: 12 mg/dL (ref 8–23)
CO2: 25 mmol/L (ref 22–32)
Calcium: 10 mg/dL (ref 8.9–10.3)
Chloride: 103 mmol/L (ref 98–111)
Creatinine, Ser: 0.61 mg/dL (ref 0.44–1.00)
GFR, Estimated: >60 mL/min (ref >60)
Glucose, Bld: 92 mg/dL (ref 70–99)
Potassium: 4 mmol/L (ref 3.5–5.1)
Sodium: 141 mmol/L (ref 135–145)

## 2024-08-04 LAB — CBC
HCT: 35.6 % — ABNORMAL LOW (ref 36.0–46.0)
Hemoglobin: 11.3 g/dL — ABNORMAL LOW (ref 12.0–15.0)
MCH: 26.8 pg (ref 26.0–34.0)
MCHC: 31.7 g/dL (ref 30.0–36.0)
MCV: 84.4 fL (ref 80.0–100.0)
Platelets: 310 K/uL (ref 150–400)
RBC: 4.22 MIL/uL (ref 3.87–5.11)
RDW: 15.5 % (ref 11.5–15.5)
WBC: 6.8 K/uL (ref 4.0–10.5)
nRBC: 0 % (ref 0.0–0.2)

## 2024-08-04 LAB — HEMOGLOBIN A1C
Hgb A1c MFr Bld: 5.4 % (ref 4.8–5.6)
Mean Plasma Glucose: 108.28 mg/dL

## 2024-08-04 SURGERY — CYSTOSCOPY/URETEROSCOPY/HOLMIUM LASER/STENT PLACEMENT
Anesthesia: General | Laterality: Left

## 2024-08-04 MED ORDER — HYDROCODONE-ACETAMINOPHEN 7.5-325 MG PO TABS
ORAL_TABLET | ORAL | Status: AC
  Filled 2024-08-04: qty 1

## 2024-08-04 MED ORDER — MIDAZOLAM HCL 5 MG/5ML IJ SOLN
INTRAMUSCULAR | Status: DC | PRN
  Administered 2024-08-04: 2 mg via INTRAVENOUS

## 2024-08-04 MED ORDER — SODIUM CHLORIDE 0.9 % IR SOLN
Status: DC | PRN
  Administered 2024-08-04: 3000 mL

## 2024-08-04 MED ORDER — ONDANSETRON HCL 4 MG/2ML IJ SOLN
INTRAMUSCULAR | Status: AC
  Filled 2024-08-04: qty 2

## 2024-08-04 MED ORDER — HYDROMORPHONE HCL 1 MG/ML IJ SOLN: 0.2500 mg | INTRAMUSCULAR | Status: DC | PRN

## 2024-08-04 MED ORDER — DROPERIDOL 2.5 MG/ML IJ SOLN: 0.6250 mg | Freq: Once | INTRAMUSCULAR | Status: DC | PRN

## 2024-08-04 MED ORDER — LACTATED RINGERS IV SOLN: INTRAVENOUS | Status: DC

## 2024-08-04 MED ORDER — LIDOCAINE HCL (PF) 2 % IJ SOLN
INTRAMUSCULAR | Status: AC
  Filled 2024-08-04: qty 5

## 2024-08-04 MED ORDER — EPHEDRINE SULFATE-NACL 50-0.9 MG/10ML-% IV SOSY
PREFILLED_SYRINGE | INTRAVENOUS | Status: DC | PRN
  Administered 2024-08-04: 5 mg via INTRAVENOUS
  Administered 2024-08-04: 10 mg via INTRAVENOUS

## 2024-08-04 MED ORDER — TRAMADOL HCL 50 MG PO TABS: 50.0000 mg | ORAL_TABLET | Freq: Four times a day (QID) | ORAL | 0 refills | Status: AC | PRN

## 2024-08-04 MED ORDER — FENTANYL CITRATE (PF) 100 MCG/2ML IJ SOLN
INTRAMUSCULAR | Status: DC | PRN
  Administered 2024-08-04: 100 ug via INTRAVENOUS
  Administered 2024-08-04: 25 ug via INTRAVENOUS

## 2024-08-04 MED ORDER — MIDAZOLAM HCL 2 MG/2ML IJ SOLN
INTRAMUSCULAR | Status: AC
  Filled 2024-08-04: qty 2

## 2024-08-04 MED ORDER — GLYCOPYRROLATE 0.2 MG/ML IJ SOLN
INTRAMUSCULAR | Status: DC | PRN
  Administered 2024-08-04: .2 mg via INTRAVENOUS

## 2024-08-04 MED ORDER — KETOROLAC TROMETHAMINE 30 MG/ML IJ SOLN
INTRAMUSCULAR | Status: DC | PRN
  Administered 2024-08-04: 30 mg via INTRAVENOUS

## 2024-08-04 MED ORDER — FENTANYL CITRATE (PF) 100 MCG/2ML IJ SOLN
INTRAMUSCULAR | Status: AC
  Filled 2024-08-04: qty 2

## 2024-08-04 MED ORDER — CEPHALEXIN 250 MG PO CAPS: 250.0000 mg | ORAL_CAPSULE | Freq: Every day | ORAL | 0 refills | Status: DC

## 2024-08-04 MED ORDER — PHENYLEPHRINE 80 MCG/ML (10ML) SYRINGE FOR IV PUSH (FOR BLOOD PRESSURE SUPPORT)
PREFILLED_SYRINGE | INTRAVENOUS | Status: AC
  Filled 2024-08-04: qty 10

## 2024-08-04 MED ORDER — LIDOCAINE HCL (PF) 2 % IJ SOLN
INTRAMUSCULAR | Status: DC | PRN
  Administered 2024-08-04: 80 mg via INTRADERMAL

## 2024-08-04 MED ORDER — ORAL CARE MOUTH RINSE: 15.0000 mL | Freq: Once | OROMUCOSAL | Status: AC

## 2024-08-04 MED ORDER — HYOSCYAMINE SULFATE 0.125 MG PO TBDP: 0.1250 mg | ORAL_TABLET | Freq: Four times a day (QID) | ORAL | 0 refills | Status: DC | PRN

## 2024-08-04 MED ORDER — CHLORHEXIDINE GLUCONATE 0.12 % MT SOLN
15.0000 mL | Freq: Once | OROMUCOSAL | Status: AC
  Administered 2024-08-04: 15 mL via OROMUCOSAL

## 2024-08-04 MED ORDER — CIPROFLOXACIN IN D5W 400 MG/200ML IV SOLN
400.0000 mg | INTRAVENOUS | Status: AC
Start: 2024-08-04 — End: 2024-08-04
  Administered 2024-08-04: 400 mg via INTRAVENOUS
  Filled 2024-08-04: qty 200

## 2024-08-04 MED ORDER — DEXAMETHASONE SODIUM PHOSPHATE 10 MG/ML IJ SOLN
INTRAMUSCULAR | Status: AC
  Filled 2024-08-04: qty 1

## 2024-08-04 MED ORDER — ARTIFICIAL TEARS OPHTHALMIC OINT
TOPICAL_OINTMENT | OPHTHALMIC | Status: AC
  Filled 2024-08-04: qty 3.5

## 2024-08-04 MED ORDER — GLYCOPYRROLATE 0.2 MG/ML IJ SOLN
INTRAMUSCULAR | Status: AC
Start: 2024-08-04 — End: 2024-08-04
  Filled 2024-08-04: qty 1

## 2024-08-04 MED ORDER — PROPOFOL 10 MG/ML IV BOLUS
INTRAVENOUS | Status: DC | PRN
  Administered 2024-08-04: 50 mg via INTRAVENOUS
  Administered 2024-08-04: 150 mg via INTRAVENOUS

## 2024-08-04 MED ORDER — ONDANSETRON HCL 4 MG/2ML IJ SOLN
INTRAMUSCULAR | Status: DC | PRN
  Administered 2024-08-04: 4 mg via INTRAVENOUS

## 2024-08-04 MED ORDER — EPHEDRINE 5 MG/ML INJ
INTRAVENOUS | Status: AC
  Filled 2024-08-04: qty 5

## 2024-08-04 MED ORDER — PROPOFOL 10 MG/ML IV BOLUS
INTRAVENOUS | Status: AC
  Filled 2024-08-04: qty 20

## 2024-08-04 MED ORDER — INSULIN ASPART 100 UNIT/ML IJ SOLN: 0.0000 [IU] | INTRAMUSCULAR | Status: DC | PRN

## 2024-08-04 MED ORDER — DEXAMETHASONE SODIUM PHOSPHATE 10 MG/ML IJ SOLN
INTRAMUSCULAR | Status: DC | PRN
  Administered 2024-08-04: 10 mg via INTRAVENOUS

## 2024-08-04 MED ORDER — HYDROCODONE-ACETAMINOPHEN 7.5-325 MG PO TABS
1.0000 | ORAL_TABLET | Freq: Once | ORAL | Status: AC | PRN
  Administered 2024-08-04: 1 via ORAL

## 2024-08-04 MED ORDER — IOHEXOL 300 MG/ML  SOLN
INTRAMUSCULAR | Status: DC | PRN
  Administered 2024-08-04: 20 mL

## 2024-08-04 MED ORDER — PHENYLEPHRINE 80 MCG/ML (10ML) SYRINGE FOR IV PUSH (FOR BLOOD PRESSURE SUPPORT)
PREFILLED_SYRINGE | INTRAVENOUS | Status: DC | PRN
  Administered 2024-08-04: 120 ug via INTRAVENOUS
  Administered 2024-08-04 (×2): 160 ug via INTRAVENOUS
  Administered 2024-08-04: 120 ug via INTRAVENOUS

## 2024-08-04 SURGICAL SUPPLY — 20 items
BAG URO CATCHER STRL LF (MISCELLANEOUS) ×1 IMPLANT
BASKET LASER NITINOL 1.9FR (BASKET) IMPLANT
BASKET ZERO TIP NITINOL 2.4FR (BASKET) IMPLANT
CATH URETL OPEN 5X70 (CATHETERS) ×1 IMPLANT
CLOTH BEACON ORANGE TIMEOUT ST (SAFETY) ×1 IMPLANT
DRSG TEGADERM 2-3/8X2-3/4 SM (GAUZE/BANDAGES/DRESSINGS) IMPLANT
FIBER LASER MOSES 200 DFL (Laser) IMPLANT
GLOVE BIO SURGEON STRL SZ 6.5 (GLOVE) ×1 IMPLANT
GOWN STRL REUS W/ TWL LRG LVL3 (GOWN DISPOSABLE) ×1 IMPLANT
GUIDEWIRE STR DUAL SENSOR (WIRE) ×1 IMPLANT
GUIDEWIRE ZIPWRE .038 STRAIGHT (WIRE) IMPLANT
KIT TURNOVER KIT A (KITS) ×1 IMPLANT
MANIFOLD NEPTUNE II (INSTRUMENTS) ×1 IMPLANT
PACK CYSTO (CUSTOM PROCEDURE TRAY) ×1 IMPLANT
SHEATH NAVIGATOR HD 11/13X28 (SHEATH) IMPLANT
SHEATH NAVIGATOR HD 11/13X36 (SHEATH) IMPLANT
STENT URET 6FRX24 CONTOUR (STENTS) IMPLANT
TRACTIP FLEXIVA PULS ID 200XHI (Laser) IMPLANT
TUBING CONNECTING 10 (TUBING) ×1 IMPLANT
TUBING UROLOGY SET (TUBING) ×1 IMPLANT

## 2024-08-04 NOTE — Discharge Instructions (Signed)
 DISCHARGE INSTRUCTIONS FOR KIDNEY STONE/URETERAL STENT   MEDICATIONS:  1. Resume all your other meds from home  2. AZO over the counter can help with the burning/stinging when you urinate. 3. Tramadol  is for moderate/severe pain, otherwise taking up to 1000 mg every 6 hours of plainTylenol will help treat your pain.   4. Continue cephalexin  for 2 weeks. 5. Hyoscyamine  can help with stent discomfort   ACTIVITY:  1. No strenuous activity x 1week  2. No driving while on narcotic pain medications  3. Drink plenty of water  4. Continue to walk at home - you can still get blood clots when you are at home, so keep active, but don't over do it.  5. May return to work/school tomorrow or when you feel ready   BATHING:  1. You can shower and we recommend daily showers.   SIGNS/SYMPTOMS TO CALL:  Please call us  if you have a fever greater than 101.5, uncontrolled nausea/vomiting, uncontrolled pain, dizziness, unable to urinate, bloody urine, chest pain, shortness of breath, leg swelling, leg pain, redness around wound, drainage from wound, or any other concerns or questions.   You can reach us  at [phone].   FOLLOW-UP:  1. Your stent will be removed in the office in 2 weeks.

## 2024-08-04 NOTE — H&P (Signed)
 CC/HPI: cc: hydronephrosis   08/03/24: 63 year old woman comes in with findings of left hydronephrosis on recent MRI of the abdomen that was ordered by her GI doctor. She has been experiencing cyclical fever and disorientation since April. It occurs every 5 to 6 weeks. She was initially treated for a tick bite and given doxycycline . She recently went to her PCP last week and was treated for UTI with Keflex . Prior to April she has never had a UTI. MRI of the abdomen showed hydronephrosis to the mid ureter. She does have a history of urolithiasis underwent ESWL in 2016. No bothersome urinary symptoms.     ALLERGIES: Penicillins Vicodin TABS    MEDICATIONS: hydroCHLOROthiazide   Lisinopril  metFORMIN  HCl  Omeprazole 40 MG Capsule Delayed Release  Aspirin 81 81 MG Tablet Delayed Release  dilTIAZem  HCl  Fiber  Fish Oil  Multivitamin  Probiotic  Synthroid   Vitamin B12  Vitamin D      GU PSH: None     PSH Notes: Tubal Ligation, Gallbladder Surgery   NON-GU PSH: Cataract surgery Remove Gallbladder Tubal Ligation - 2014     GU PMH: Renal calculus, Kidney stone on left side - 2016 Gross hematuria, Gross hematuria - 2016 Urinary Calculus, Unspec, Urolithiasis - 2016 Urinary Frequency, Increased urinary frequency - 2016      PMH Notes: Liver disease  Kidney stones    NON-GU PMH: Encounter for general adult medical examination without abnormal findings, Encounter for preventive health examination - 2016 Hyperthyroidism, Hyperthyroidism - 2014 Personal history of other diseases of the circulatory system, History of hypertension - 2014 Arthritis Diabetes Type 2 GERD Hypertension    FAMILY HISTORY: Cardiac Arrest - Father Death In The Family Father - Father Family Health Status Number - Runs In Family Heart Disease - Mother Hypertension - Mother Kidney Failure - Runs in Family Pure Hypercholesterolemia - Mother   SOCIAL HISTORY: Marital Status: Married Preferred  Language: English; Ethnicity: Not Hispanic Or Latino; Race: White Current Smoking Status: Patient does not smoke anymore.   Tobacco Use Assessment Completed: Used Tobacco in last 30 days? Does drink.  Drinks 1 caffeinated drink per day.     Notes: History of tobacco use, Alcohol Use, Occupation:, Marital History - Currently Married, Caffeine Use   REVIEW OF SYSTEMS:    GU Review Female:   Patient reports frequent urination and get up at night to urinate. Patient denies hard to postpone urination, burning /pain with urination, leakage of urine, stream starts and stops, trouble starting your stream, have to strain to urinate, and being pregnant.  Gastrointestinal (Upper):   Patient denies nausea, vomiting, and indigestion/ heartburn.  Gastrointestinal (Lower):   Patient denies diarrhea and constipation.  Constitutional:   Patient reports fatigue. Patient denies fever, night sweats, and weight loss.  Skin:   Patient denies skin rash/ lesion and itching.  Eyes:   Patient denies blurred vision and double vision.  Ears/ Nose/ Throat:   Patient denies sore throat and sinus problems.  Hematologic/Lymphatic:   Patient denies swollen glands and easy bruising.  Cardiovascular:   Patient denies leg swelling and chest pains.  Respiratory:   Patient denies cough and shortness of breath.  Endocrine:   Patient denies excessive thirst.  Musculoskeletal:   Patient denies back pain and joint pain.  Neurological:   Patient denies headaches and dizziness.  Psychologic:   Patient denies depression and anxiety.   VITAL SIGNS:      08/03/2024 08:10 AM  Weight 162 lb / 73.48  kg  Height 63.5 in / 161.29 cm  BP 103/65 mmHg  Pulse 71 /min  Temperature 97.3 F / 36.2 C  BMI 28.2 kg/m   MULTI-SYSTEM PHYSICAL EXAMINATION:    Constitutional: Well-nourished. No physical deformities. Normally developed. Good grooming.  Neck: Neck symmetrical, not swollen. Normal tracheal position.  Respiratory: No labored  breathing, no use of accessory muscles.   Skin: No paleness, no jaundice, no cyanosis. No lesion, no ulcer, no rash.  Neurologic / Psychiatric: Oriented to time, oriented to place, oriented to person. No depression, no anxiety, no agitation.  Eyes: Normal conjunctivae. Normal eyelids.  Ears, Nose, Mouth, and Throat: Left ear no scars, no lesions, no masses. Right ear no scars, no lesions, no masses. Nose no scars, no lesions, no masses. Normal hearing. Normal lips.  Musculoskeletal: Normal gait and station of head and neck.     Complexity of Data:  Records Review:   Previous Patient Records, POC Tool  Urine Test Review:   Urinalysis  X-Ray Review: C.T. Abdomen/Pelvis: Reviewed Films. Discussed With Patient. CT shows a proximal left ureteral calculus as well as distal left ureteral calculus. The distal stone appears obstructing. She has upstream moderate hydronephrosis with tortuosity of the proximal ureter. MRI Abdomen: Reviewed Films. Reviewed Report. Discussed With Patient. IMPRESSION:  1. Cirrhosis without evidence of hepatocellular carcinoma.  2. Moderate left-sided hydroureteronephrosis, followed to the level  of the mid pelvis. Possible area of distal left ureteric  enhancement. Consider further evaluation with pre and postcontrast  hematuria protocol CT to exclude either obstructive stone or  urothelial lesion.  3. Aortic Atherosclerosis (ICD10-I70.0).   These results will be called to the ordering clinician or  representative by the Radiologist Assistant, and communication  documented in the PACS or [employer].    Electronically Signed  By: Rockey Kilts M.D.  On: 07/29/2024 15:23    PROCEDURES:         C.T. ABD-Pelv w/o - 25823      Patient confirmed No Neulasta OnPro Device.         Urinalysis w/Scope Dipstick Dipstick Cont'd Micro  Color: Yellow Bilirubin: Neg mg/dL WBC/hpf: 10 - 79/yeq  Appearance: Slightly Cloudy Ketones: Neg mg/dL RBC/hpf: 0 - 2/hpf   Specific Gravity: 1.010 Blood: Trace ery/uL Bacteria: Rare (0-9/hpf)  pH: 7.5 Protein: Trace mg/dL Cystals: NS (Not Seen)  Glucose: Neg mg/dL Urobilinogen: 0.2 mg/dL Casts: NS (Not Seen)    Nitrites: Neg Trichomonas: Not Present    Leukocyte Esterase: 3+ leu/uL Mucous: Not Present      Epithelial Cells: 0 - 5/hpf      Yeast: NS (Not Seen)      Sperm: Not Present    ASSESSMENT:      ICD-10 Details  1 GU:   Ureteral obstruction secondary to calculous - N13.2 Undiagnosed New Problem  2   Renal calculus - N20.0 Chronic, Stable  3   Ureteral calculus - N20.1 Acute, Systemic Symptoms   PLAN:           Orders X-Rays: C.T. Abdomen/Pelvis Without I.V. Contrast  X-Ray Notes: History:   Hematuria: Yes / No   Patient to see MD after exam: Yes/ No   Previous exam:   When:   Where:   Diabetic: Yes / No   BUN/ Creatinine:   Date of last BUN Creatinine:   Weight in pounds:   Allergy- IV Contrast: Yes/ No  Prior Authorization #: [employer]: NPCR ref #[id number]  Schedule         Document Letter(s):  Created for Patient: Clinical Summary         Notes:   1. Left hydronephrosis:  - Patient with ureteral calculi causing obstruction on CT that and pelvis today  - Discussed risks and benefits of cystoscopy with left ureteroscopy, laser lithotripsy and stent placement to be done tomorrow. Risks include but are not limited to pain, bleeding, infection, damage to surrounding structures, need for additional treatment. Patient understands if there is infection present during surgery tomorrow that she will have a stent and then this will be a staged procedure.  - She will continue her antibiotics

## 2024-08-04 NOTE — Transfer of Care (Signed)
 Immediate Anesthesia Transfer of Care Note  Patient: Shannon Pope  Procedure(s) Performed: CYSTOSCOPY/URETEROSCOPY/HOLMIUM LASER/STENT PLACEMENT (Left)  Patient Location: PACU  Anesthesia Type:General  Level of Consciousness: awake, alert , oriented, and patient cooperative  Airway & Oxygen Therapy: Patient Spontanous Breathing and Patient connected to nasal cannula oxygen  Post-op Assessment: Report given to RN and Post -op Vital signs reviewed and stable  Post vital signs: Reviewed and stable  Last Vitals:  Vitals Value Taken Time  BP 125/71 08/04/24 13:41  Temp    Pulse 80 08/04/24 13:44  Resp 19 08/04/24 13:44  SpO2 100 % 08/04/24 13:44  Vitals shown include unfiled device data.  Last Pain:  Vitals:   08/04/24 1118  TempSrc:   PainSc: 0-No pain      Patients Stated Pain Goal: 4 (08/04/24 1118)  Complications: No notable events documented.

## 2024-08-04 NOTE — Anesthesia Procedure Notes (Signed)
 Procedure Name: LMA Insertion Date/Time: 08/04/2024 12:52 PM  Performed by: Franchot Delon RAMAN, CRNAPre-anesthesia Checklist: Patient identified, Emergency Drugs available, Suction available and Patient being monitored Patient Re-evaluated:Patient Re-evaluated prior to induction Oxygen Delivery Method: Circle System Utilized Preoxygenation: Pre-oxygenation with 100% oxygen Induction Type: IV induction Ventilation: Mask ventilation without difficulty LMA: LMA inserted LMA Size: 4.0 Number of attempts: 1 Airway Equipment and Method: Bite block Placement Confirmation: positive ETCO2 Tube secured with: Tape Dental Injury: Teeth and Oropharynx as per pre-operative assessment

## 2024-08-04 NOTE — Op Note (Signed)
 Preoperative diagnosis: left ureteral calculi  Postoperative diagnosis: left ureteral calculi  Procedure:  Cystoscopy left ureteroscopy, laser lithotripsy, basket stone extraction left 15F x 24cm ureteral stent placement - no string left retrograde pyelography with interpretation  Surgeon: Valli Shank, MD  Anesthesia: General  Complications: None  Intraoperative findings:  Normal urethra Bilateral orthotropic ureteral orifices left retrograde pyelography demonstrated a filling defect within the left ureter consistent with the patient's known calculus with proximal hydronephrosis and tortuous ureter Bladder mucosa normal without masses   EBL: Minimal  Specimens: left ureteral calculus  Disposition of specimens: [facility] for stone analysis  Indication: Shannon Pope is a 63 y.o.   patient with obstructing ureteral left ureteral stone and associated left symptoms. After reviewing the management options for treatment, the patient elected to proceed with the above surgical procedure(s). We have discussed the potential benefits and risks of the procedure, side effects of the proposed treatment, the likelihood of the patient achieving the goals of the procedure, and any potential problems that might occur during the procedure or recuperation. Informed consent has been obtained.   Description of procedure:  The patient was taken to the operating room and general anesthesia was induced.  The patient was placed in the dorsal lithotomy position, prepped and draped in the usual sterile fashion, and preoperative antibiotics were administered. A preoperative time-out was performed.   Cystourethroscopy was performed.  The patient's urethra was examined and was normal.  The bladder was then systematically examined in its entirety. There was no evidence for any bladder tumors, stones, or other mucosal pathology.    Attention then turned to the left ureteral orifice and a  0.38 sensor wire was advanced up to the kidney with fluoroscopic guidance.  A 4.5Fr semirigid ureteroscope was then advanced into the ureter next to the guidewire and the calculus was identified in the distal ureter. The stone was then fragmented with the 242 micron holmium laser fiber.  All stones were then removed from the ureter with an escape basket.  Ureteroscopy continued more proximally and another stone encountered.  The proximal stone was fragmented and fragments removed with the basket. Ureteroscopy continued up to renal pelvis and no additional stones were seen.  The ureterscope was removed examining the ureter on the way out.  There was mucosal edema in the proximal ureter.    An open ended ureteral catheter was advanced over the wire and a retrograde pyelogram was obtained.  The wire was replaced.   The wire was then backloaded through the cystoscope and a ureteral stent was advance over the wire using Seldinger technique.  The stent was positioned appropriately under fluoroscopic and cystoscopic guidance.  The wire was then removed with an adequate stent curl noted in the renal pelvis as well as in the bladder.  The bladder was then emptied and the procedure ended.  The patient appeared to tolerate the procedure well and without complications.  The patient was able to be awakened and transferred to the recovery unit in satisfactory condition.   Disposition: The stent will stay in place for 2 weeks and be removed in the office.

## 2024-08-04 NOTE — Anesthesia Postprocedure Evaluation (Signed)
 Anesthesia Post Note  Patient: Shannon Pope  Procedure(s) Performed: CYSTOSCOPY/URETEROSCOPY/HOLMIUM LASER/STENT PLACEMENT (Left)     Patient location during evaluation: PACU Anesthesia Type: General Level of consciousness: awake and alert, oriented and patient cooperative Pain management: pain level controlled Vital Signs Assessment: post-procedure vital signs reviewed and stable Respiratory status: spontaneous breathing, nonlabored ventilation and respiratory function stable Cardiovascular status: blood pressure returned to baseline and stable Postop Assessment: no apparent nausea or vomiting Anesthetic complications: no   No notable events documented.  Last Vitals:  Vitals:   08/04/24 1415 08/04/24 1430  BP: 121/78 121/85  Pulse: 83 80  Resp: 16 16  Temp:    SpO2: 98% 95%    Last Pain:  Vitals:   08/04/24 1430  TempSrc:   PainSc: 4                  Salma Walrond,E. Ayad Nieman

## 2024-08-05 ENCOUNTER — Encounter (HOSPITAL_COMMUNITY): Payer: Self-pay | Admitting: Urology

## 2024-08-26 ENCOUNTER — Other Ambulatory Visit: Payer: Self-pay | Admitting: Nurse Practitioner

## 2024-08-26 DIAGNOSIS — I10 Essential (primary) hypertension: Secondary | ICD-10-CM

## 2024-08-26 DIAGNOSIS — G4700 Insomnia, unspecified: Secondary | ICD-10-CM

## 2024-08-28 ENCOUNTER — Other Ambulatory Visit: Payer: Self-pay | Admitting: Family Medicine

## 2024-08-28 ENCOUNTER — Ambulatory Visit
Admission: RE | Admit: 2024-08-28 | Discharge: 2024-08-28 | Disposition: A | Discharge status: Home / Self Care | Source: Ambulatory Visit | Attending: Family Medicine | Admitting: Family Medicine

## 2024-08-28 DIAGNOSIS — Z1231 Encounter for screening mammogram for malignant neoplasm of breast: Secondary | ICD-10-CM

## 2024-09-16 ENCOUNTER — Other Ambulatory Visit: Payer: Self-pay | Admitting: Nurse Practitioner

## 2024-09-16 DIAGNOSIS — I10 Essential (primary) hypertension: Secondary | ICD-10-CM

## 2024-10-20 ENCOUNTER — Ambulatory Visit: Admitting: Family Medicine

## 2024-10-20 ENCOUNTER — Encounter: Payer: Self-pay | Admitting: Family Medicine

## 2024-10-20 VITALS — BP 112/74 | HR 64 | Ht 64.0 in | Wt 175.6 lb

## 2024-10-20 DIAGNOSIS — Z9049 Acquired absence of other specified parts of digestive tract: Secondary | ICD-10-CM | POA: Diagnosis not present

## 2024-10-20 DIAGNOSIS — E1159 Type 2 diabetes mellitus with other circulatory complications: Secondary | ICD-10-CM | POA: Diagnosis not present

## 2024-10-20 DIAGNOSIS — I152 Hypertension secondary to endocrine disorders: Secondary | ICD-10-CM | POA: Diagnosis not present

## 2024-10-20 DIAGNOSIS — K7460 Unspecified cirrhosis of liver: Secondary | ICD-10-CM

## 2024-10-20 DIAGNOSIS — Z87442 Personal history of urinary calculi: Secondary | ICD-10-CM | POA: Diagnosis not present

## 2024-10-20 DIAGNOSIS — E66811 Obesity, class 1: Secondary | ICD-10-CM | POA: Diagnosis not present

## 2024-10-20 DIAGNOSIS — E785 Hyperlipidemia, unspecified: Secondary | ICD-10-CM

## 2024-10-20 DIAGNOSIS — I700 Atherosclerosis of aorta: Secondary | ICD-10-CM

## 2024-10-20 DIAGNOSIS — E1169 Type 2 diabetes mellitus with other specified complication: Secondary | ICD-10-CM

## 2024-10-20 DIAGNOSIS — E039 Hypothyroidism, unspecified: Secondary | ICD-10-CM | POA: Diagnosis not present

## 2024-10-20 DIAGNOSIS — Z6830 Body mass index (BMI) 30.0-30.9, adult: Secondary | ICD-10-CM

## 2024-10-20 MED ORDER — TIRZEPATIDE 2.5 MG/0.5ML ~~LOC~~ SOAJ: 2.5000 mg | SUBCUTANEOUS | 0 refills | Status: DC

## 2024-10-20 NOTE — Progress Notes (Signed)
 1. Type 2 diabetes mellitus with other specified complication, without long-term current use of insulin  (HCC)   2. Cirrhosis, MR Abdomen 07/27/24 and CT chest 04/25/24: cirrhosis, mod-advanced with portal venous htn;  metabolic associated   3. History of cholecystectomy   4. Aortic atherosclerosis   5. History of nephrolithiasis, left, with hydronephrosis, had atypical presentation   6. Hypertension associated with diabetes (HCC)   7. Type 2 diabetes mellitus with hyperlipidemia (HCC)   8. Acquired hypothyroidism, Rx Synthroid  only   9. Class 1 obesity with serious comorbidity and body mass index (BMI) of 30.0 to 30.9 in adult, unspecified obesity type    Meds ordered this encounter  Medications   DISCONTD: tirzepatide  (MOUNJARO ) 2.5 MG/0.5ML Pen    Sig: Inject 2.5 mg into the skin once a week.    Dispense:  2 mL    Refill:  0   hydrochlorothiazide  (HYDRODIURIL ) 25 MG tablet    Sig: TAKE 1 TABLET BY MOUTH  DAILY FOR BLOOD PRESSURE  AND FLUID RETENTION/ANKLE  SWELLING    Dispense:  90 tablet    Refill:  0    Please send a replace/new response with 90-Day Supply if appropriate to maximize member benefit. Requesting 1 year supply.   levothyroxine  (SYNTHROID ) 100 MCG tablet    Sig: Take 1 tablet (100 mcg total) by mouth daily before breakfast.    Dispense:  90 tablet    Refill:  1    Assessment and Plan Assessment & Plan Type 2 diabetes mellitus with fatty liver disease and cirrhosis Hemoglobin A1c at 5.9 indicates prediabetes. Discussed Mounjaro  benefits for diabetes and fatty liver management, including organ protection and anti-inflammatory effects. Mounjaro  may replace metformin . - Prescribed Mounjaro . - Discontinue metformin  once Mounjaro  is effective. - Monitor blood glucose and adjust Mounjaro  dosage as needed. - Schedule follow-up in 1-3 months to assess Mounjaro  response.  History of kidney stones, status post ureteral stone removal No recurrence of symptoms post-surgery.  Previous stones caused intermittent fevers and chills due to sepsis. - Monitor for recurrence of kidney stones or symptoms.  Right knee pain Managed with gel injections, recent injection provided relief. - Monitor knee pain and response to gel injections. - Consider repeat gel injections every six months if effective.  Hypertension Managed with hydrochlorothiazide , adjusted to 25 mg due to ankle swelling. - Continue hydrochlorothiazide  25 mg daily.  Hypothyroidism Managed with Synthroid , adjusted to 100 mcg due to abnormal thyroid levels. Prefers brand name due to adverse reactions to generics. - Continue Synthroid  100 mcg daily. - Monitor thyroid function tests.  History of QT prolongation No current symptoms or EKG abnormalities. - Monitor for cardiac symptoms or EKG changes.  General Health Maintenance - Schedule a wellness visit for screenings and health maintenance.   Geni Shutter, DO, MS, FAAFP, Dipl. [facility] [address] [place], [address] Dept: [phone] Dept Fax: [phone]  Subjective:   Patient is etablishing care in this system with me as PCP. Prior records reviewed when available. Chart updated today with reconciliation of problem list, medications, allergies, and relevant history. Preventive care and chronic disease status reviewed. Portions of historical chart may remain incomplete; will update on an ongoing basis as clinically indicated.  Discussed the use of AI scribe software for clinical note transcription with the patient, who gave verbal consent to proceed. History of Present Illness Shannon Pope is a 63 year old female who presents for follow-up after recent kidney stone  surgery.  Postoperative status following nephrolithiasis surgery - Underwent surgical removal of two kidney stones lodged in the ureter on September 12th - Prior to surgery, experienced recurrent episodes of high fevers,  extreme cold chills, confusion, headaches, and severe cough every three to four weeks since April - No further episodes of fever or chills since surgery - No pain associated with the kidney stones, as she was embedded in the ureter wall  Glycemic control in type 2 diabetes mellitus - Recent hemoglobin A1c of 5.9% while on metformin  - Metformin  dosage recently reduced from 2000 mg to 1000 mg daily following weight loss - Concern regarding maintenance of blood glucose control with reduced metformin  dosage  Thyroid hormone replacement therapy - Currently taking Synthroid  100 mcg daily - Previously experienced adverse reactions to generic thyroid medications, including lip sores - No current adverse effects on brand-name Synthroid   Autoimmune serology and symptoms - History of positive antinuclear antibody (ANA) test - No joint pain or symptoms suggestive of autoimmune disease  Cardiac history post-covid-19 infection - History of long QT interval and mitral valve prolapse following COVID-19 infection  Review of Systems: Negative, with the exception of above mentioned in HPI.  Current Outpatient Medications:    ALPRAZolam  (XANAX ) 0.5 MG tablet, TAKE 1/2 TO 1 TABLET BY MOUTH  ONCE DAILY IF NEEDED FOR ANXIETY ATTACK. LIMIT TO 5 DAYS PER WEEK TO AVOID ADDICTION AND DEMENTIA, Disp: 25 tablet, Rfl: 0   aspirin 81 MG tablet, Take 81 mg by mouth daily., Disp: , Rfl:    diltiazem  (CARDIZEM  CD) 240 MG 24 hr capsule, TAKE 1 CAPSULE BY MOUTH DAILY  FOR BLOOD PRESSURE, Disp: 90 capsule, Rfl: 3   ibuprofen  (ADVIL ) 800 MG tablet, Take 800 mg by mouth every 6 (six) hours as needed for mild pain (pain score 1-3) or moderate pain (pain score 4-6)., Disp: , Rfl:    losartan  (COZAAR ) 50 MG tablet, TAKE 1 TABLET BY MOUTH DAILY, Disp: 90 tablet, Rfl: 3   metFORMIN  (GLUCOPHAGE -XR) 500 MG 24 hr tablet, TAKE 1 TO 2 TABLETS BY MOUTH TWICE DAILY FOR DIABETES, Disp: 120 tablet, Rfl: 2   Multiple Vitamin (MULTIVITAMIN  WITH MINERALS) TABS tablet, Take 1 tablet by mouth daily., Disp: , Rfl:    Omega-3 Fatty Acids (FISH OIL) 1000 MG CAPS, Take 1,000 mg by mouth daily., Disp: , Rfl:    omeprazole (PRILOSEC) 40 MG capsule, Take 40 mg by mouth daily., Disp: , Rfl:    Perfluorohexyloctane (MIEBO) 1.338 GM/ML SOLN, Place 1 drop into both eyes 4 (four) times daily as needed (Dry eyes)., Disp: , Rfl:    traMADol  (ULTRAM ) 50 MG tablet, Take 1 tablet (50 mg total) by mouth every 6 (six) hours as needed., Disp: 10 tablet, Rfl: 0   VITAMIN D  PO, Take 5,000 Units by mouth daily., Disp: , Rfl:    zinc gluconate 50 MG tablet, Take 50 mg by mouth every other day., Disp: , Rfl:    hydrochlorothiazide  (HYDRODIURIL ) 25 MG tablet, TAKE 1 TABLET BY MOUTH  DAILY FOR BLOOD PRESSURE  AND FLUID RETENTION/ANKLE  SWELLING, Disp: 90 tablet, Rfl: 0   levothyroxine  (SYNTHROID ) 100 MCG tablet, Take 1 tablet (100 mcg total) by mouth daily before breakfast., Disp: 90 tablet, Rfl: 1   tirzepatide  (MOUNJARO ) 2.5 MG/0.5ML Pen, Inject 2.5 mg into the skin once a week., Disp: 6 mL, Rfl: 0   Objective:   BP 112/74 (BP Location: Left Arm)   Pulse 64   Ht 5' 4 (1.626  m)   Wt 175 lb 9.6 oz (79.7 kg)   LMP 09/01/2013   SpO2 99%   BMI 30.14 kg/m   Wt Readings from Last 3 Encounters:  10/20/24 175 lb 9.6 oz (79.7 kg)  08/03/24 162 lb (73.5 kg)  04/25/24 178 lb (80.7 kg)   Physical Exam Constitutional:      General: She is not in acute distress.    Appearance: She is well-developed.  HENT:     Head: Normocephalic and atraumatic.  Eyes:     Conjunctiva/sclera: Conjunctivae normal.  Cardiovascular:     Rate and Rhythm: Normal rate and regular rhythm.     Heart sounds: Normal heart sounds.  Pulmonary:     Effort: Pulmonary effort is normal.     Breath sounds: Normal breath sounds.  Neurological:     General: No focal deficit present.     Mental Status: She is alert.  Psychiatric:        Behavior: Behavior normal.    Lab Results   Component Value Date   CREATININE 0.61 08/04/2024   BUN 12 08/04/2024   NA 141 08/04/2024   K 4.0 08/04/2024   CL 103 08/04/2024   CO2 25 08/04/2024   Lab Results  Component Value Date   ALT 28 04/25/2024   AST 35 04/25/2024   ALKPHOS 130 (H) 04/25/2024   BILITOT 0.7 04/25/2024   Lab Results  Component Value Date   HGBA1C 5.4 08/04/2024   HGBA1C 5.6 09/09/2023   HGBA1C 5.7 (H) 05/30/2023   HGBA1C 5.3 05/29/2022   HGBA1C 5.5 11/29/2021   No results found for: INSULIN  Lab Results  Component Value Date   TSH 1.05 09/09/2023   Lab Results  Component Value Date   CHOL 209 (H) 09/09/2023   HDL 55 09/09/2023   LDLCALC 123 (H) 09/09/2023   TRIG 195 (H) 09/09/2023   CHOLHDL 3.8 09/09/2023   Lab Results  Component Value Date   VD25OH 85 05/30/2023   VD25OH 63 05/29/2022   VD25OH 67 05/18/2021   Lab Results  Component Value Date   WBC 6.8 08/04/2024   HGB 11.3 (L) 08/04/2024   HCT 35.6 (L) 08/04/2024   MCV 84.4 08/04/2024   PLT 310 08/04/2024   Lab Results  Component Value Date   IRON 64 07/20/2019   TIBC 327 07/20/2019   FERRITIN 42 08/24/2015

## 2024-10-21 DIAGNOSIS — E1169 Type 2 diabetes mellitus with other specified complication: Secondary | ICD-10-CM

## 2024-10-21 MED ORDER — TIRZEPATIDE 2.5 MG/0.5ML ~~LOC~~ SOAJ: 2.5000 mg | SUBCUTANEOUS | 0 refills | Status: DC

## 2024-10-21 NOTE — Telephone Encounter (Signed)
 I have called pt and spoken with he regarding the message that she sent today. I let her know that per her OV notes, Dr Prentiss does want her to start the Mounjaro when she picks it up and to continue the metformin  until the Mounjaro is effective. I will also send in a 90 day script on the Mounjaro so that pt can get the $25 copay at the pharmacy. She will schedule her PEX with Dr Prentiss in September.  Dr Prentiss- Pt would like to know how long she is supposed to take the metformin  with the Mounjaro. Days? Or weeks? Also, she would like to know if she needs to be checking her blood sugar on a regular basis. If so, can we send a script for the Freestyle Libre 3+? And lastly, when do you want her to come back to check her A1C? Is the appt in March too long to wait?

## 2024-10-25 ENCOUNTER — Encounter: Payer: Self-pay | Admitting: Family Medicine

## 2024-10-25 DIAGNOSIS — Z9049 Acquired absence of other specified parts of digestive tract: Secondary | ICD-10-CM | POA: Insufficient documentation

## 2024-10-25 DIAGNOSIS — I700 Atherosclerosis of aorta: Secondary | ICD-10-CM | POA: Insufficient documentation

## 2024-10-25 DIAGNOSIS — K7460 Unspecified cirrhosis of liver: Secondary | ICD-10-CM | POA: Insufficient documentation

## 2024-10-25 MED ORDER — HYDROCHLOROTHIAZIDE 25 MG PO TABS: ORAL_TABLET | ORAL | 0 refills | Status: AC

## 2024-10-25 MED ORDER — LEVOTHYROXINE SODIUM 100 MCG PO TABS: 100.0000 ug | ORAL_TABLET | Freq: Every day | ORAL | 1 refills | Status: DC

## 2024-10-27 ENCOUNTER — Telehealth: Payer: Self-pay

## 2024-10-27 DIAGNOSIS — E039 Hypothyroidism, unspecified: Secondary | ICD-10-CM

## 2024-10-27 MED ORDER — SYNTHROID 100 MCG PO TABS: 100.0000 ug | ORAL_TABLET | Freq: Every day | ORAL | 1 refills | Status: AC

## 2024-10-27 NOTE — Telephone Encounter (Signed)
 Good morning Shannon Pope! I have a question/concern for you. This is a pt of Dr Prentiss and she cannot take the generic form of Synthroid . She gets this from a specific pharmacy this is listed in this message thread. We have already got the pharmacy added to her chart and pt is not yet in need of a refill. My concern is that pt sees in her med list that her medication is listed as Levothyroxine  100mcg. No matter how many times I have tried to add it in her list as the brand name, Synthroid , when I select okay, it goes into her chart as Levothyroxine . Do you know how to fix this? Pt is concerned that it is going to be sent to the pharmacy incorrectly when she does request a refill. We appreciate your time looking into this for us !

## 2024-10-27 NOTE — Progress Notes (Signed)
   10/27/2024  Patient ID: Shannon Pope, female   DOB: Jan 10, 1961, 63 y.o.   MRN: [id number]  Clinic routed request to assist with Synthroid  100mcg daily.  Patient is only able to take brand name Synthroid , so prescriptions must sent with brand Synthroid  selected as medication and dispense as written box checked.  Order pending to replace levothyroxine  #90 with 1 refill that was sent to the pharmacy on 12/7.  Shannon Pope, PharmD, DPLA

## 2024-11-05 ENCOUNTER — Encounter: Admitting: Rheumatology

## 2024-11-29 DIAGNOSIS — G4700 Insomnia, unspecified: Secondary | ICD-10-CM

## 2024-11-30 MED ORDER — ALPRAZOLAM 0.5 MG PO TABS: ORAL_TABLET | ORAL | 0 refills | Status: AC

## 2024-11-30 NOTE — Telephone Encounter (Signed)
 Refill request received for Alprazolam  0.5mg  FOV:01/18/2025 LOV:10/20/2024 Last refill: this has not yet been refilled by you in this office Medication is pending your approval.

## 2024-12-07 ENCOUNTER — Ambulatory Visit: Admitting: Rheumatology

## 2024-12-07 DIAGNOSIS — E1169 Type 2 diabetes mellitus with other specified complication: Secondary | ICD-10-CM

## 2024-12-07 MED ORDER — TIRZEPATIDE 2.5 MG/0.5ML ~~LOC~~ SOAJ: 2.5000 mg | SUBCUTANEOUS | 1 refills | Status: AC

## 2024-12-07 MED ORDER — TIRZEPATIDE 2.5 MG/0.5ML ~~LOC~~ SOAJ: 2.5000 mg | SUBCUTANEOUS | 1 refills | Status: DC

## 2024-12-07 NOTE — Telephone Encounter (Signed)
 This CRM has already been taken care of. I called and spoke with Pharmacy. Documentation is already apart of this encounter.

## 2024-12-07 NOTE — Telephone Encounter (Signed)
 I called and spoke with pt to explain what was going on. She would like to try sending through mail order. She uses [facility]. I have resent the 90 days script through [facility] and pt will let me know if there are any issues.

## 2024-12-07 NOTE — Telephone Encounter (Unsigned)
 Copied from CRM [id number]. Topic: Clinical - Prescription Issue >> Dec 07, 2024 10:45 AM Laymon HERO wrote: Reason for CRM:  [facility] pharmacy calling- Insurance is only covering 30 day supply of Mounjaro 

## 2024-12-07 NOTE — Telephone Encounter (Signed)
 I have called pts pharmacy and spoke with Lamar. I explained the issue pt is having. She is under the impression that she should be able to get a 3 month script for $25. Lamar was skeptical that this is the case but asked that we sent in a new script for 90 days and call back to see if it works. He thinks that it may be that she has a deductible and she will have the meet it before she can get the $25 copay.

## 2024-12-15 ENCOUNTER — Other Ambulatory Visit: Payer: Self-pay | Admitting: Cardiology

## 2024-12-23 DIAGNOSIS — I10 Essential (primary) hypertension: Secondary | ICD-10-CM

## 2024-12-23 MED ORDER — LOSARTAN POTASSIUM 50 MG PO TABS: 50.0000 mg | ORAL_TABLET | Freq: Every day | ORAL | 3 refills | Status: AC

## 2024-12-23 NOTE — Telephone Encounter (Signed)
 Refill request for  Losartan  50 mg LR  HX provider LOV 10/20/24 FOV  01/18/25  Please review and advise.  Thanks. Dm/cma

## 2025-01-18 ENCOUNTER — Ambulatory Visit: Admitting: Family Medicine

## 2025-08-02 ENCOUNTER — Encounter: Admitting: Family Medicine
